# Patient Record
Sex: Female | Born: 1995 | Race: White | Hispanic: No | Marital: Married | Smoking: Never smoker
Health system: Southern US, Community
[De-identification: ages and names within clinical notes are randomized; demographics above are authoritative.]

## PROBLEM LIST (undated history)

## (undated) ENCOUNTER — Inpatient Hospital Stay (HOSPITAL_COMMUNITY): Payer: Self-pay

## (undated) DIAGNOSIS — D126 Benign neoplasm of colon, unspecified: Secondary | ICD-10-CM

## (undated) DIAGNOSIS — G4700 Insomnia, unspecified: Secondary | ICD-10-CM

## (undated) DIAGNOSIS — F419 Anxiety disorder, unspecified: Secondary | ICD-10-CM

## (undated) DIAGNOSIS — K589 Irritable bowel syndrome without diarrhea: Secondary | ICD-10-CM

## (undated) DIAGNOSIS — J309 Allergic rhinitis, unspecified: Secondary | ICD-10-CM

## (undated) DIAGNOSIS — F329 Major depressive disorder, single episode, unspecified: Secondary | ICD-10-CM

## (undated) DIAGNOSIS — R51 Headache: Secondary | ICD-10-CM

## (undated) DIAGNOSIS — R519 Headache, unspecified: Secondary | ICD-10-CM

## (undated) DIAGNOSIS — T7840XA Allergy, unspecified, initial encounter: Secondary | ICD-10-CM

## (undated) HISTORY — DX: Benign neoplasm of colon, unspecified: D12.6

## (undated) HISTORY — DX: Other disorders of bilirubin metabolism: E80.6

## (undated) HISTORY — PX: TONSILLECTOMY: SUR1361

## (undated) HISTORY — PX: MOUTH SURGERY: SHX715

## (undated) HISTORY — DX: Irritable bowel syndrome without diarrhea: K58.9

## (undated) HISTORY — DX: Insomnia, unspecified: G47.00

## (undated) HISTORY — DX: Allergic rhinitis, unspecified: J30.9

## (undated) HISTORY — DX: Major depressive disorder, single episode, unspecified: F32.9

## (undated) HISTORY — DX: Allergy, unspecified, initial encounter: T78.40XA

## (undated) HISTORY — DX: Anxiety disorder, unspecified: F41.9

---
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
## 2013-02-14 ENCOUNTER — Encounter (HOSPITAL_BASED_OUTPATIENT_CLINIC_OR_DEPARTMENT_OTHER): Payer: Self-pay | Admitting: Emergency Medicine

## 2013-02-14 ENCOUNTER — Emergency Department (HOSPITAL_BASED_OUTPATIENT_CLINIC_OR_DEPARTMENT_OTHER)
Admission: EM | Admit: 2013-02-14 | Discharge: 2013-02-14 | Disposition: A | Discharge status: Home / Self Care | Payer: BC Managed Care – PPO | Attending: Emergency Medicine | Admitting: Emergency Medicine

## 2013-02-14 DIAGNOSIS — G4700 Insomnia, unspecified: Secondary | ICD-10-CM | POA: Insufficient documentation

## 2013-02-14 DIAGNOSIS — F3289 Other specified depressive episodes: Secondary | ICD-10-CM | POA: Insufficient documentation

## 2013-02-14 DIAGNOSIS — A088 Other specified intestinal infections: Secondary | ICD-10-CM | POA: Insufficient documentation

## 2013-02-14 DIAGNOSIS — Z862 Personal history of diseases of the blood and blood-forming organs and certain disorders involving the immune mechanism: Secondary | ICD-10-CM | POA: Insufficient documentation

## 2013-02-14 DIAGNOSIS — A084 Viral intestinal infection, unspecified: Secondary | ICD-10-CM

## 2013-02-14 DIAGNOSIS — IMO0002 Reserved for concepts with insufficient information to code with codable children: Secondary | ICD-10-CM | POA: Insufficient documentation

## 2013-02-14 DIAGNOSIS — Z8639 Personal history of other endocrine, nutritional and metabolic disease: Secondary | ICD-10-CM | POA: Insufficient documentation

## 2013-02-14 DIAGNOSIS — Z792 Long term (current) use of antibiotics: Secondary | ICD-10-CM | POA: Insufficient documentation

## 2013-02-14 DIAGNOSIS — F329 Major depressive disorder, single episode, unspecified: Secondary | ICD-10-CM | POA: Insufficient documentation

## 2013-02-14 DIAGNOSIS — Z3202 Encounter for pregnancy test, result negative: Secondary | ICD-10-CM | POA: Insufficient documentation

## 2013-02-14 DIAGNOSIS — Z79899 Other long term (current) drug therapy: Secondary | ICD-10-CM | POA: Insufficient documentation

## 2013-02-14 LAB — URINE MICROSCOPIC-ADD ON

## 2013-02-14 LAB — URINALYSIS, ROUTINE W REFLEX MICROSCOPIC
Bilirubin Urine: NEGATIVE
GLUCOSE, UA: NEGATIVE mg/dL
KETONES UR: 15 mg/dL — AB
Leukocytes, UA: NEGATIVE
Nitrite: NEGATIVE
Protein, ur: NEGATIVE mg/dL
Specific Gravity, Urine: 1.023 (ref 1.005–1.030)
UROBILINOGEN UA: 0.2 mg/dL (ref 0.0–1.0)
pH: 6.5 (ref 5.0–8.0)

## 2013-02-14 LAB — PREGNANCY, URINE: Preg Test, Ur: NEGATIVE

## 2013-02-14 MED ORDER — SODIUM CHLORIDE 0.9 % IV BOLUS (SEPSIS)
1000.0000 mL | Freq: Once | INTRAVENOUS | Status: AC
  Administered 2013-02-14: 1000 mL via INTRAVENOUS

## 2013-02-14 MED ORDER — ONDANSETRON 4 MG PO TBDP
ORAL_TABLET | ORAL | Status: AC
  Filled 2013-02-14: qty 1

## 2013-02-14 MED ORDER — DIPHENOXYLATE-ATROPINE 2.5-0.025 MG PO TABS: 1.0000 | ORAL_TABLET | Freq: Four times a day (QID) | ORAL | Status: DC | PRN

## 2013-02-14 MED ORDER — DIPHENOXYLATE-ATROPINE 2.5-0.025 MG PO TABS
2.0000 | ORAL_TABLET | Freq: Once | ORAL | Status: AC
  Administered 2013-02-14: 2 via ORAL
  Filled 2013-02-14: qty 2

## 2013-02-14 MED ORDER — ONDANSETRON HCL 4 MG/2ML IJ SOLN
4.0000 mg | Freq: Once | INTRAMUSCULAR | Status: AC
  Administered 2013-02-14: 4 mg via INTRAVENOUS
  Filled 2013-02-14: qty 2

## 2013-02-14 MED ORDER — ONDANSETRON 8 MG PO TBDP: 8.0000 mg | ORAL_TABLET | Freq: Three times a day (TID) | ORAL | Status: DC | PRN

## 2013-02-14 NOTE — ED Notes (Signed)
Pt states she awoke around 0200 with persistent vomting, developed chills, leg cramps

## 2013-02-14 NOTE — ED Provider Notes (Signed)
CSN: [id number]     Arrival date & time 02/14/13  4034 History   None    Chief Complaint  Patient presents with  . Vomiting    (Consider location/radiation/quality/duration/timing/severity/associated sxs/prior Treatment) HPI This 18 year old female who went to bed yesterday evening about 11:30 with some stomach discomfort. She awoke about 2:30 this morning with vomiting. She vomited 3 times. She then drank some fluids and went back to bed. She woke about 4:30 with several further episodes of vomiting. The vomiting has been associated with some mild to moderate abdominal cramping, dry mouth, chills and muscle cramps in her legs. She has had no diarrhea. She is not aware of having a fever. She is currently on her menses.  Past Medical History  Diagnosis Date  . ALLERGIC RHINITIS 12/12/2006  . GILBERT'S SYNDROME 03/07/2010  . INSOMNIA-SLEEP DISORDER-UNSPEC 04/18/2007  . Depression 10/03/2011   Past Surgical History  Procedure Laterality Date  . Tonsillectomy     Family History  Problem Relation Age of Onset  . Breast cancer Maternal Grandmother   . Heart disease Maternal Grandmother   . Pancreatic cancer    . Lung cancer Paternal Grandfather   . Kidney cancer Maternal Aunt   . Heart disease Father   . Heart disease Maternal Grandfather    History  Substance Use Topics  . Smoking status: Never Smoker   . Smokeless tobacco: Never Used  . Alcohol Use: No   OB History   Grav Para Term Preterm Abortions TAB SAB Ect Mult Living                 Review of Systems  All other systems reviewed and are negative.    Allergies  Azithromycin; Hydrocodone; and Morphine  Home Medications   Current Outpatient Rx  Name  Route  Sig  Dispense  Refill  . escitalopram (LEXAPRO) 10 MG tablet      TAKE 1 TABLET EVERY DAY   90 tablet   3   . amoxicillin-clavulanate (AUGMENTIN) 875-125 MG per tablet   Oral   Take 1 tablet by mouth 2 (two) times daily.   20 tablet   0   . ondansetron  (ZOFRAN) 4 MG tablet   Oral   Take 1 tablet (4 mg total) by mouth every 8 (eight) hours as needed for nausea.   40 tablet   1   . SUMAtriptan (IMITREX) 100 MG tablet   Oral   Take 1 tablet (100 mg total) by mouth every 2 (two) hours as needed for migraine.   10 tablet   3   . triamcinolone cream (KENALOG) 0.1 %   Topical   Apply topically 2 (two) times daily.   30 g   0    BP 112/65  Pulse 102  Temp(Src) 98.7 F (37.1 C) (Oral)  Resp 18  Ht 5\' 7"  (1.702 m)  Wt 120 lb (54.432 kg)  BMI 18.79 kg/m2  SpO2 100%  LMP 02/08/2013  Physical Exam General: Well-developed, well-nourished female in no acute distress; appearance consistent with age of record HENT: normocephalic; atraumatic Eyes: pupils equal, round and reactive to light; extraocular muscles intact Neck: supple Heart: regular rate and rhythm; tachycardia Lungs: clear to auscultation bilaterally Abdomen: soft; nondistended; mild epigastric and suprapubic tenderness; no masses or hepatosplenomegaly; bowel sounds present Extremities: No deformity; full range of motion; pulses normal; no edema Neurologic: Awake, alert and oriented; motor function intact in all extremities and symmetric; no facial droop Skin: Warm and dry Psychiatric:  Normal mood and affect    ED Course  Procedures (including critical care time)    MDM   Nursing notes and vitals signs, including pulse oximetry, reviewed.  Summary of this visit's results, reviewed by myself:  Labs:  Results for orders placed during the hospital encounter of 02/14/13 (from the past 24 hour(s))  URINALYSIS, ROUTINE W REFLEX MICROSCOPIC     Status: Abnormal   Collection Time    02/14/13  5:33 AM      Result Value Range   Color, Urine YELLOW  YELLOW   APPearance CLEAR  CLEAR   Specific Gravity, Urine 1.023  1.005 - 1.030   pH 6.5  5.0 - 8.0   Glucose, UA NEGATIVE  NEGATIVE mg/dL   Hgb urine dipstick LARGE (*) NEGATIVE   Bilirubin Urine NEGATIVE  NEGATIVE    Ketones, ur 15 (*) NEGATIVE mg/dL   Protein, ur NEGATIVE  NEGATIVE mg/dL   Urobilinogen, UA 0.2  0.0 - 1.0 mg/dL   Nitrite NEGATIVE  NEGATIVE   Leukocytes, UA NEGATIVE  NEGATIVE  PREGNANCY, URINE     Status: None   Collection Time    02/14/13  5:33 AM      Result Value Range   Preg Test, Ur NEGATIVE  NEGATIVE  URINE MICROSCOPIC-ADD ON     Status: Abnormal   Collection Time    02/14/13  5:33 AM      Result Value Range   Squamous Epithelial / LPF FEW (*) RARE   WBC, UA 0-2  <3 WBC/hpf   RBC / HPF 21-50  <3 RBC/hpf   Bacteria, UA FEW (*) RARE   6:48 AM Patient drinking fluids without emesis after IV fluid bolus and Zofran. She is now also having diarrhea.    Wynetta Fines, MD 02/14/13 [phone]

## 2013-06-02 ENCOUNTER — Encounter: Payer: Self-pay | Admitting: Internal Medicine

## 2013-06-02 ENCOUNTER — Ambulatory Visit (INDEPENDENT_AMBULATORY_CARE_PROVIDER_SITE_OTHER): Payer: BC Managed Care – PPO | Admitting: Internal Medicine

## 2013-06-02 VITALS — BP 120/70 | HR 87 | Temp 99.8°F | Resp 13 | Wt 117.4 lb

## 2013-06-02 DIAGNOSIS — J019 Acute sinusitis, unspecified: Secondary | ICD-10-CM

## 2013-06-02 MED ORDER — AMOXICILLIN 500 MG PO CAPS
500.0000 mg | ORAL_CAPSULE | Freq: Three times a day (TID) | ORAL | Status: DC
Start: 2013-06-02 — End: 2013-08-15

## 2013-06-02 NOTE — Patient Instructions (Signed)
Plain Mucinex (NOT D) for thick secretions ;force NON dairy fluids .   Nasal cleansing in the shower as discussed with lather of mild shampoo.After 10 seconds wash off lather while  exhaling through nostrils. Make sure that all residual soap is removed to prevent irritation.  Flonase OR Nasacort AQ 1 spray in each nostril twice a day as needed. Use the "crossover" technique into opposite nostril spraying toward opposite ear @ 45 degree angle, not straight up into nostril.  Use a Neti pot daily only  as needed for significant sinus congestion; going from open side to congested side . Plain Allegra (NOT D )  160 daily , Loratidine 10 mg , OR Zyrtec 10 mg @ bedtime  as needed for itchy eyes & sneezing. Use Eucerin or another moisturizing agent  twice a day  for the drying.

## 2013-06-02 NOTE — Progress Notes (Signed)
   Subjective:    Patient ID: Mary Cochran, female    DOB: [birth year], 18 y.o.   MRN: [id number]  HPI   The symptoms began approximately one week ago as rhinitis, head congestion, itchy/watery eyes and sneezing. She also had cold sweats and wet palms and possible low-grade fever. She felt that the trigger was probably environmental factors rather than sick associates. She is in pre-nursing at a local community [employer]  Zyrtec did help some of the symptoms.  Now she has frontal sinus headache, dark green nasal secretions, left earache, dry cough, dyspnea, fever, chills, sweats, and the extrinsic symptoms noted above. She also has had some epistaxis.    Review of Systems  She denies facial pain, dental pain, sore throat, otic discharge, purulent sputum, wheezing, or significant arthralgias/myalgias.     Objective:   Physical Exam General appearance: thin but in good health ;well nourished; no acute distress or increased work of breathing is present.  No  lymphadenopathy about the head, neck, or axilla noted.   Eyes: No conjunctival inflammation or lid edema is present. Ears:  External ear exam shows no significant lesions or deformities.  Otoscopic examination reveals clear canals, tympanic membranes are intact bilaterally without bulging, retraction, inflammation or discharge.Ear jewelry/posts  Nose:  External nasal examination shows no deformity or inflammation. Post jewelry. Nasal mucosa are erythematous & dry without lesions or exudates. No septal dislocation or deviation.No obstruction to airflow.   Oral exam: Dental hygiene is good; lips and gums are healthy appearing.There is no oropharyngeal erythema or exudate noted.   Neck:  No deformities, masses, or tenderness noted.   Supple with full range of motion without pain.   Heart:  Normal rate and regular rhythm. S1 and S2 normal without gallop, murmur, click, rub or other extra sounds.   Lungs:Chest clear to auscultation; no  wheezes, rhonchi,rales ,or rubs present.No increased work of breathing.    Extremities:  No cyanosis, edema, or clubbing  noted    Skin: Warm & dry          Assessment & Plan:  #1 rhinosinusitis without significant bronchitis  Plan: Nasal hygiene interventions discussed. See prescription medications

## 2013-06-02 NOTE — Progress Notes (Signed)
Pre visit review using our clinic review tool, if applicable. No additional management support is needed unless otherwise documented below in the visit note. 

## 2013-06-03 ENCOUNTER — Ambulatory Visit: Payer: BC Managed Care – PPO | Admitting: Internal Medicine

## 2013-08-14 ENCOUNTER — Encounter: Payer: Self-pay | Admitting: Physician Assistant

## 2013-08-14 ENCOUNTER — Ambulatory Visit (INDEPENDENT_AMBULATORY_CARE_PROVIDER_SITE_OTHER): Payer: BC Managed Care – PPO | Admitting: Physician Assistant

## 2013-08-14 VITALS — BP 100/74 | HR 72 | Temp 98.3°F | Resp 18 | Wt 119.0 lb

## 2013-08-14 DIAGNOSIS — J029 Acute pharyngitis, unspecified: Secondary | ICD-10-CM

## 2013-08-14 LAB — POCT MONO (EPSTEIN BARR VIRUS): Mono, POC: NEGATIVE

## 2013-08-14 NOTE — Progress Notes (Signed)
Pre visit review using our clinic review tool, if applicable. No additional management support is needed unless otherwise documented below in the visit note. 

## 2013-08-14 NOTE — Patient Instructions (Addendum)
We will call you with the results of your throat culture when available.  Throat sprays, throat lozenges, and salt water gargles can help relieve sore throat symptoms.  Tylenol or Motrin alternating for aches and pains.  Plain Over the Counter Mucinex (NOT Mucinex D) for thick secretions  Force NON dairy fluids, drinking plenty of water is best.    Over the Counter Flonase OR Nasacort AQ 1 spray in each nostril twice a day as needed. Use the "crossover" technique into opposite nostril spraying toward opposite ear @ 45 degree angle, not straight up into nostril.   Plain Over the Counter Allegra (NOT D )  160 daily , OR Loratidine 10 mg , OR Zyrtec 10 mg @ bedtime  as needed for itchy eyes & sneezing.  Saline Irrigation and Saline Sprays can also help reduce symptoms.  If emergency symptoms discussed during visit developed, seek medical attention immediately.  Followup as needed, or for worsening or persistent symptoms despite treatment.   Pharyngitis Pharyngitis is a sore throat (pharynx). There is redness, pain, and swelling of your throat. HOME CARE   Drink enough fluids to keep your pee (urine) clear or pale yellow.  Only take medicine as told by your doctor.  You may get sick again if you do not take medicine as told. Finish your medicines, even if you start to feel better.  Do not take aspirin.  Rest.  Rinse your mouth (gargle) with salt water ( tsp of salt per 1 qt of water) every 1-2 hours. This will help the pain.  If you are not at risk for choking, you can suck on hard candy or sore throat lozenges. GET HELP IF:  You have large, tender lumps on your neck.  You have a rash.  You cough up green, yellow-brown, or bloody spit. GET HELP RIGHT AWAY IF:   You have a stiff neck.  You drool or cannot swallow liquids.  You throw up (vomit) or are not able to keep medicine or liquids down.  You have very bad pain that does not go away with medicine.  You have  problems breathing (not from a stuffy nose). MAKE SURE YOU:   Understand these instructions.  Will watch your condition.  Will get help right away if you are not doing well or get worse. Document Released: 07/11/2007 Document Revised: 11/12/2012 Document Reviewed: 09/29/2012 [facility] Patient Information 2015 [place], [place]. This information is not intended to replace advice given to you by your health care provider. Make sure you discuss any questions you have with your health care provider.

## 2013-08-14 NOTE — Progress Notes (Signed)
Subjective:    Patient ID: Mary Cochran, female    DOB: October 02, 1995, 18 y.o.   MRN: [id number]  Sore Throat  This is a new problem. The current episode started in the past 7 days (3 days). The problem has been gradually worsening. The pain is worse on the right side. There has been no fever. The pain is at a severity of 6/10. The pain is moderate. Associated symptoms include congestion, coughing, ear pain, headaches, neck pain, swollen glands and trouble swallowing. Pertinent negatives include no abdominal pain, diarrhea, drooling, ear discharge, hoarse voice, plugged ear sensation, shortness of breath, stridor or vomiting. She has had no exposure to strep or mono. Exposure to: Brother and sister had a cold last weekend.. She has tried NSAIDs for the symptoms. The treatment provided mild (helped the pain) relief.      Review of Systems  Constitutional: Positive for diaphoresis ("clammy"). Negative for fever and chills.  HENT: Positive for congestion, ear pain, postnasal drip, sinus pressure, sore throat and trouble swallowing. Negative for drooling, ear discharge and hoarse voice.   Respiratory: Positive for cough. Negative for shortness of breath and stridor.   Cardiovascular: Negative for chest pain.  Gastrointestinal: Negative for nausea, vomiting, abdominal pain and diarrhea.  Musculoskeletal: Positive for neck pain.  Neurological: Positive for headaches.  All other systems reviewed and are negative.     Past Medical History  Diagnosis Date  . ALLERGIC RHINITIS 12/12/2006  . GILBERT'S SYNDROME 03/07/2010  . INSOMNIA-SLEEP DISORDER-UNSPEC 04/18/2007  . Depression 10/03/2011    History   Social History  . Marital Status: Single    Spouse Name: N/A    Number of Children: N/A  . Years of Education: N/A   Occupational History  . Not on file.   Social History Main Topics  . Smoking status: Never Smoker   . Smokeless tobacco: Never Used  . Alcohol Use: No  . Drug Use: No   . Sexual Activity: Not on file   Other Topics Concern  . Not on file   Social History Narrative  . No narrative on file    Past Surgical History  Procedure Laterality Date  . Tonsillectomy      Family History  Problem Relation Age of Onset  . Breast cancer Maternal Grandmother   . Heart disease Maternal Grandmother   . Pancreatic cancer    . Lung cancer Paternal Grandfather   . Kidney cancer Maternal Aunt   . Heart disease Father   . Heart disease Maternal Grandfather     Allergies  Allergen Reactions  . Azithromycin   . Hydrocodone     REACTION: GI upset  . Morphine     Current Outpatient Prescriptions on File Prior to Visit  Medication Sig Dispense Refill  . diphenoxylate-atropine (LOMOTIL) 2.5-0.025 MG per tablet Take 1-2 tablets by mouth 4 (four) times daily as needed for diarrhea or loose stools.  16 tablet  0  . escitalopram (LEXAPRO) 10 MG tablet TAKE 1 TABLET EVERY DAY  90 tablet  3  . ondansetron (ZOFRAN ODT) 8 MG disintegrating tablet Take 1 tablet (8 mg total) by mouth every 8 (eight) hours as needed.  10 tablet  0  . triamcinolone cream (KENALOG) 0.1 % Apply topically 2 (two) times daily.  30 g  0  . amoxicillin (AMOXIL) 500 MG capsule Take 1 capsule (500 mg total) by mouth 3 (three) times daily.  30 capsule  0   No current facility-administered medications on  file prior to visit.    EXAM: BP 100/74  Pulse 72  Temp(Src) 98.3 F (36.8 C) (Oral)  Resp 18  Wt 119 lb (53.978 kg)      Objective:   Physical Exam  Nursing note and vitals reviewed. Constitutional: She is oriented to person, place, and time. She appears well-developed and well-nourished. No distress.  HENT:  Head: Normocephalic and atraumatic.  Right Ear: External ear normal.  Left Ear: External ear normal.  Nose: Nose normal.  Mouth/Throat: No oropharyngeal exudate.  Oropharynx is slightly erythematous, no exudate. Bilateral TMs normal. Bilateral frontal sinuses non-ttp. Right  maxillary sinuses mildly TTP. Left Max sinus non-ttp.   Eyes: Conjunctivae and EOM are normal. Pupils are equal, round, and reactive to light.  Neck: Normal range of motion. Neck supple.  Cardiovascular: Normal rate, regular rhythm and intact distal pulses.   Pulmonary/Chest: Effort normal and breath sounds normal. No stridor. No respiratory distress. She exhibits no tenderness.  Abdominal: Soft. Bowel sounds are normal. She exhibits no distension and no mass. There is no tenderness. There is no rebound and no guarding.  Musculoskeletal: Normal range of motion.  Lymphadenopathy:    She has no cervical adenopathy.  Neurological: She is alert and oriented to person, place, and time.  Skin: Skin is warm and dry. No rash noted. She is not diaphoretic. No erythema. No pallor.  Psychiatric: She has a normal mood and affect. Her behavior is normal. Judgment and thought content normal.     Lab Results  Component Value Date   WBC 8.5 10/03/2011   HGB 13.6 10/03/2011   HCT 41.4 10/03/2011   PLT 287.0 10/03/2011   GLUCOSE 82 10/03/2011   ALT 16 10/03/2011   AST 23 10/03/2011   NA 138 10/03/2011   K 3.8 10/03/2011   CL 105 10/03/2011   CREATININE 0.8 10/03/2011   BUN 9 10/03/2011   CO2 27 10/03/2011   TSH 0.98 10/03/2011        Assessment & Plan:  Joellyn was seen today for sore throat.  Diagnoses and associated orders for this visit:  Sorethroat - POC Mono (Epstein Barr Virus) - POC Rapid Strep A - Throat culture ([facility]) - Epstein-Barr virus VCA antibody panel - Epstein-Barr virus VCA, IgM  Acute pharyngitis, unspecified pharyngitis type Comments: Strep neg. Monospot pos? Will culture. Probably viral. Will tx with OTC throat lozenges, sprays, salt water gargles, flonase, zyrtec, mucinex, push fluids. - Epstein-Barr virus VCA antibody panel - Epstein-Barr virus VCA, IgM    Pts mono spot was faintly positive. Will obtain ebstein-barr titer. Advised pt on avoiding physical activity due  to risk of splenic rupture with mono, though the patient exhibits no hepatosplenomegaly on exam.  Return precautions provided, and patient handout on pharyngitis.  Plan to follow up as needed, or for worsening or persistent symptoms despite treatment.  Patient Instructions  We will call you with the results of your throat culture when available.  Throat sprays, throat lozenges, and salt water gargles can help relieve sore throat symptoms.  Tylenol or Motrin alternating for aches and pains.  Plain Over the Counter Mucinex (NOT Mucinex D) for thick secretions  Force NON dairy fluids, drinking plenty of water is best.    Over the Counter Flonase OR Nasacort AQ 1 spray in each nostril twice a day as needed. Use the "crossover" technique into opposite nostril spraying toward opposite ear @ 45 degree angle, not straight up into nostril.   Plain Over the [employer] (  NOT D )  160 daily , OR Loratidine 10 mg , OR Zyrtec 10 mg @ bedtime  as needed for itchy eyes & sneezing.  Saline Irrigation and Saline Sprays can also help reduce symptoms.  If emergency symptoms discussed during visit developed, seek medical attention immediately.  Followup as needed, or for worsening or persistent symptoms despite treatment.

## 2013-08-15 ENCOUNTER — Encounter (HOSPITAL_BASED_OUTPATIENT_CLINIC_OR_DEPARTMENT_OTHER): Payer: Self-pay | Admitting: Emergency Medicine

## 2013-08-15 ENCOUNTER — Emergency Department (HOSPITAL_BASED_OUTPATIENT_CLINIC_OR_DEPARTMENT_OTHER)
Admission: EM | Admit: 2013-08-15 | Discharge: 2013-08-15 | Disposition: A | Discharge status: Home / Self Care | Payer: BC Managed Care – PPO | Attending: Emergency Medicine | Admitting: Emergency Medicine

## 2013-08-15 DIAGNOSIS — Z8639 Personal history of other endocrine, nutritional and metabolic disease: Secondary | ICD-10-CM | POA: Insufficient documentation

## 2013-08-15 DIAGNOSIS — Z79899 Other long term (current) drug therapy: Secondary | ICD-10-CM | POA: Insufficient documentation

## 2013-08-15 DIAGNOSIS — Z862 Personal history of diseases of the blood and blood-forming organs and certain disorders involving the immune mechanism: Secondary | ICD-10-CM | POA: Insufficient documentation

## 2013-08-15 DIAGNOSIS — B349 Viral infection, unspecified: Secondary | ICD-10-CM

## 2013-08-15 DIAGNOSIS — Z8709 Personal history of other diseases of the respiratory system: Secondary | ICD-10-CM | POA: Insufficient documentation

## 2013-08-15 DIAGNOSIS — Z3202 Encounter for pregnancy test, result negative: Secondary | ICD-10-CM | POA: Insufficient documentation

## 2013-08-15 DIAGNOSIS — B9789 Other viral agents as the cause of diseases classified elsewhere: Secondary | ICD-10-CM | POA: Insufficient documentation

## 2013-08-15 DIAGNOSIS — F3289 Other specified depressive episodes: Secondary | ICD-10-CM | POA: Insufficient documentation

## 2013-08-15 DIAGNOSIS — R599 Enlarged lymph nodes, unspecified: Secondary | ICD-10-CM | POA: Insufficient documentation

## 2013-08-15 DIAGNOSIS — F329 Major depressive disorder, single episode, unspecified: Secondary | ICD-10-CM | POA: Insufficient documentation

## 2013-08-15 LAB — EPSTEIN-BARR VIRUS VCA, IGM: EBV VCA IgM: <10 U/mL (ref <36.0)

## 2013-08-15 LAB — URINALYSIS, ROUTINE W REFLEX MICROSCOPIC
Bilirubin Urine: NEGATIVE
Glucose, UA: NEGATIVE mg/dL
Ketones, ur: NEGATIVE mg/dL
Leukocytes, UA: NEGATIVE
Nitrite: NEGATIVE
Protein, ur: NEGATIVE mg/dL
SPECIFIC GRAVITY, URINE: 1.002 — AB (ref 1.005–1.030)
Urobilinogen, UA: 0.2 mg/dL (ref 0.0–1.0)
pH: 6.5 (ref 5.0–8.0)

## 2013-08-15 LAB — URINE MICROSCOPIC-ADD ON

## 2013-08-15 LAB — EPSTEIN-BARR VIRUS VCA ANTIBODY PANEL
EBV EA IgG: <5 U/mL (ref <9.0)
EBV NA IgG: 542 U/mL — ABNORMAL HIGH (ref <18.0)
EBV VCA IgM: <10 U/mL (ref <36.0)

## 2013-08-15 LAB — PREGNANCY, URINE: PREG TEST UR: NEGATIVE

## 2013-08-15 NOTE — ED Provider Notes (Signed)
CSN: [id number]     Arrival date & time 08/15/13  9485 History   First MD Initiated Contact with Patient 08/15/13 1000     Chief Complaint  Patient presents with  . Generalized Body Aches     (Consider location/radiation/quality/duration/timing/severity/associated sxs/prior Treatment) HPI Comments: Patient presents with fever and body aches. She also has a sore throat. She said the sore throat started 4 days ago. She's been running intermittent fevers but hasn't checked her temperature. She's had some chills with her fever goes up. She's had no nausea or vomiting. Yesterday she started having some increasing achiness in her arms and her legs. She also is sore across her neck and back and has a mild headache. She denies any rashes. She denies abdominal pain. She was seen by her primary care physician yesterday who did a rapid strep that was negative. Monospot was negative. Mono antibodies were sent.   Past Medical History  Diagnosis Date  . ALLERGIC RHINITIS 12/12/2006  . GILBERT'S SYNDROME 03/07/2010  . INSOMNIA-SLEEP DISORDER-UNSPEC 04/18/2007  . Depression 10/03/2011   Past Surgical History  Procedure Laterality Date  . Tonsillectomy     Family History  Problem Relation Age of Onset  . Breast cancer Maternal Grandmother   . Heart disease Maternal Grandmother   . Pancreatic cancer    . Lung cancer Paternal Grandfather   . Kidney cancer Maternal Aunt   . Heart disease Father   . Heart disease Maternal Grandfather    History  Substance Use Topics  . Smoking status: Never Smoker   . Smokeless tobacco: Never Used  . Alcohol Use: No   OB History   Grav Para Term Preterm Abortions TAB SAB Ect Mult Living                 Review of Systems  Constitutional: Positive for fever, chills, activity change, appetite change and fatigue. Negative for diaphoresis.  HENT: Positive for rhinorrhea and sore throat. Negative for congestion, sneezing, trouble swallowing and voice change.   Eyes:  Negative.   Respiratory: Negative for cough, chest tightness and shortness of breath.   Cardiovascular: Negative for chest pain and leg swelling.  Gastrointestinal: Negative for nausea, vomiting, abdominal pain, diarrhea and blood in stool.  Genitourinary: Negative for frequency, hematuria, flank pain and difficulty urinating.  Musculoskeletal: Positive for myalgias and neck pain. Negative for arthralgias and back pain.  Skin: Negative for rash.  Neurological: Positive for headaches. Negative for dizziness, speech difficulty, weakness and numbness.      Allergies  Azithromycin; Hydrocodone; and Morphine  Home Medications   Prior to Admission medications   Medication Sig Start Date End Date Taking? Authorizing Provider  norethindrone-ethinyl estradiol (JUNEL FE,GILDESS FE,LOESTRIN FE) 1-20 MG-MCG tablet Take 1 tablet by mouth daily.   Yes Historical Provider, MD  escitalopram (LEXAPRO) 10 MG tablet TAKE 1 TABLET EVERY DAY 09/21/12   Biagio Borg, MD  triamcinolone cream (KENALOG) 0.1 % Apply topically 2 (two) times daily. 10/16/12   Biagio Borg, MD   BP 97/69  Pulse 106  Temp(Src) 98.7 F (37.1 C) (Oral)  Resp 18  Ht 5\' 7"  (1.702 m)  Wt 118 lb (53.524 kg)  BMI 18.48 kg/m2  SpO2 98% Physical Exam  Constitutional: She is oriented to person, place, and time. She appears well-developed and well-nourished.  HENT:  Head: Normocephalic and atraumatic.  Right Ear: External ear normal.  Left Ear: External ear normal.  Mild erythema to the posterior pharynx  Eyes: Pupils  are equal, round, and reactive to light.  Neck: Normal range of motion. Neck supple.  There is no neck stiffness. Patient sitting up in bed moving her head all around. She is able to touch her chin to her chest. She's able to bring her knees up to her chest and roll up in a fetal position without problem.  Cardiovascular: Normal rate, regular rhythm and normal heart sounds.   Pulmonary/Chest: Effort normal and breath  sounds normal. No respiratory distress. She has no wheezes. She has no rales. She exhibits no tenderness.  Abdominal: Soft. Bowel sounds are normal. There is no tenderness. There is no rebound and no guarding.  Musculoskeletal: Normal range of motion. She exhibits no edema.  Lymphadenopathy:    She has cervical adenopathy (Mild).  Neurological: She is alert and oriented to person, place, and time.  Skin: Skin is warm and dry. No rash noted.  No rash  Psychiatric: She has a normal mood and affect.    ED Course  Procedures (including critical care time) Labs Review Results for orders placed during the hospital encounter of 08/15/13  URINALYSIS, ROUTINE W REFLEX MICROSCOPIC      Result Value Ref Range   Color, Urine YELLOW  YELLOW   APPearance CLEAR  CLEAR   Specific Gravity, Urine 1.002 (*) 1.005 - 1.030   pH 6.5  5.0 - 8.0   Glucose, UA NEGATIVE  NEGATIVE mg/dL   Hgb urine dipstick SMALL (*) NEGATIVE   Bilirubin Urine NEGATIVE  NEGATIVE   Ketones, ur NEGATIVE  NEGATIVE mg/dL   Protein, ur NEGATIVE  NEGATIVE mg/dL   Urobilinogen, UA 0.2  0.0 - 1.0 mg/dL   Nitrite NEGATIVE  NEGATIVE   Leukocytes, UA NEGATIVE  NEGATIVE  PREGNANCY, URINE      Result Value Ref Range   Preg Test, Ur NEGATIVE  NEGATIVE  URINE MICROSCOPIC-ADD ON      Result Value Ref Range   Squamous Epithelial / LPF FEW (*) RARE   RBC / HPF 0-2  <3 RBC/hpf   Bacteria, UA FEW (*) RARE   No results found.    EKG Interpretation None        Imaging Review No results found.   EKG Interpretation None      MDM   Final diagnoses:  Viral syndrome    Patient is well-appearing. She is sitting up in bed smiling playing in her room. She clinically did not appear to have meningitis. I reviewed her labs and her IgG titers were positive for Epstein-Barr however her IgM titers were negative which could indicate either a very recent infection or more likely a past infection.  Either way I feel the patient likely  has a viral syndrome. Her throat culture is still pending it was done by her primary care physician. I advised in symptomatic care and to drink lots of fluids. Advised her followup with her primary care physician if she's not feeling better on Monday. Advised her she has worsening headache neck pain or other worsening symptoms to return here to the ED for reevaluation.    Malvin Johns, MD 08/15/13 1113

## 2013-08-15 NOTE — ED Notes (Addendum)
Patient here with sore throat that started on Wednesday with gland swelling, has progressed to neck pain and now total body pain. Reports that she saw her MD yesterday and had mono test and waiting for that final result. Chills with same. Also complains of occipital headache and neck and spinal pain. Took 2 ibuprofen 1 hour pta. Seen at [facility] primary

## 2013-08-15 NOTE — Discharge Instructions (Signed)

## 2013-08-16 LAB — CULTURE, GROUP A STREP: Organism ID, Bacteria: NORMAL

## 2013-08-17 ENCOUNTER — Telehealth: Payer: Self-pay

## 2013-08-17 ENCOUNTER — Telehealth: Payer: Self-pay | Admitting: *Deleted

## 2013-08-17 NOTE — Telephone Encounter (Signed)
Called and spoke with pt and pt states that she had body aches on Saturday and her throat hurt off and on.  Now pt states she feels like she has a knot on the left side of her throat.  Pt has never seen an ENT specialist.

## 2013-08-17 NOTE — Telephone Encounter (Signed)
[facility] Triage Call Report Triage Record Num: [id number] Operator: Pat Kocher Patient Name: Mary Cochran Call Date & Time: 08/15/2013 9:05:40AM Patient Phone: [phone] PCP: Cathlean Cower Patient Gender: Female PCP Fax : [phone] Patient DOB: [birth year] Practice Name: [facility] Reason for Call: Caller: Nancy/Grandparent; PCP: Cathlean Cower (Adults only); CB#: [phone]; Call regarding Pain all over ; Onset 08/10/13 sore throat. 08/14/13 right gland in neck very swollen with sore throat, shoulders were also hurting. Labs done in office for possible mono, no results. 08/15/13 pain all over, especially in neck. Rates pain 8/10. Hurts to bend neck forward/chin to chest. Unable to sleep. Much worse that previously. Tactile fever (no thermometer available). 0845 Took Ibuprofen 400mg . Emergent sxs of 'New onset of neck pain with forward head movement and severe generalized headache, fever, or altered mental status' positive as per Neck Pain protocol. Care advice given per protocol and advised to go to ER now, grandmother states she will take pt to [place] now. Protocol(s) Used: Neck Pain Recommended Outcome per Protocol: See ED Immediately Reason for Outcome: New onset of neck pain with forward head movement (no injury) AND severe generalized headache, fever, or altered mental status Care Advice: ~ Another adult should drive. ~ IMMEDIATE ACTION Write down provider's name. List or place the following in a bag for transport with the patient: current prescription and/or nonprescription medications; alternative treatments, therapies and medications; and street drugs. ~ ~ Call EMS 911 if new or worsening drowsiness/difficulty awakening, confusion, or seizure. Take sips of clear liquids (such as water, clear fruit juices without pulp, soda, tea or coffee without dairy or non-dairy creamer, clear broth or bouillon, oral hydration solution, or plain gelatin,  fruit ices/popsicles, hard candy) as tolerated. Sucking on ice chips is another option. ~ 08/15/2013 9:21:51AM Page 1 of 1 [id number]

## 2013-08-18 NOTE — Telephone Encounter (Signed)
Ok per Rodman Key to give pt information to ENT.  Left a message for pt to return call.

## 2013-08-20 ENCOUNTER — Telehealth: Payer: Self-pay | Admitting: *Deleted

## 2013-08-20 NOTE — Telephone Encounter (Signed)
Left msg on triage stating md rx lexapro the side effects from that med is making her very aggressive, confrontational, very resistant she is not her self. Requesting md to change to some thing else. Called [name] back inform her pt will need to make appt before med can be change. She ask if we can call pt [phone] to make appt because she doesn't listen to them. Called pt no answer LMOM need to make F/u appt with [name]...Mary Cochran

## 2013-08-20 NOTE — Telephone Encounter (Signed)
Spoke with pt and pt was given information for ENT specialist.

## 2013-08-20 NOTE — Telephone Encounter (Signed)
Left a message for pt to return call 

## 2013-09-27 ENCOUNTER — Other Ambulatory Visit: Payer: Self-pay | Admitting: Internal Medicine

## 2013-09-30 ENCOUNTER — Encounter: Payer: Self-pay | Admitting: Internal Medicine

## 2013-09-30 ENCOUNTER — Ambulatory Visit (INDEPENDENT_AMBULATORY_CARE_PROVIDER_SITE_OTHER): Payer: BC Managed Care – PPO | Admitting: Internal Medicine

## 2013-09-30 VITALS — BP 100/70 | HR 67 | Temp 98.7°F | Ht 67.0 in | Wt 123.1 lb

## 2013-09-30 DIAGNOSIS — Z0000 Encounter for general adult medical examination without abnormal findings: Secondary | ICD-10-CM

## 2013-09-30 DIAGNOSIS — L259 Unspecified contact dermatitis, unspecified cause: Secondary | ICD-10-CM

## 2013-09-30 DIAGNOSIS — F411 Generalized anxiety disorder: Secondary | ICD-10-CM

## 2013-09-30 DIAGNOSIS — F419 Anxiety disorder, unspecified: Secondary | ICD-10-CM

## 2013-09-30 DIAGNOSIS — L309 Dermatitis, unspecified: Secondary | ICD-10-CM | POA: Insufficient documentation

## 2013-09-30 MED ORDER — ESCITALOPRAM OXALATE 10 MG PO TABS: ORAL_TABLET | ORAL | Status: DC

## 2013-09-30 MED ORDER — TRIAMCINOLONE ACETONIDE 0.1 % EX CREA: TOPICAL_CREAM | Freq: Two times a day (BID) | CUTANEOUS | Status: DC

## 2013-09-30 NOTE — Assessment & Plan Note (Signed)

## 2013-09-30 NOTE — Assessment & Plan Note (Signed)
stable overall by history and exam, , and pt to continue medical treatment as before,  to f/u any worsening symptoms or concerns  

## 2013-09-30 NOTE — Patient Instructions (Signed)
Please take all new medication as prescribed  Please continue all other medications as before, and refills have been done if requested.  Please have the pharmacy call with any other refills you may need.  Please continue your efforts at being more active, low cholesterol diet, and weight control.  You are otherwise up to date with prevention measures today.  Please keep your appointments with your specialists as you may have planned  Please return in 1 year for your yearly visit, or sooner if needed

## 2013-09-30 NOTE — Progress Notes (Signed)
Subjective:    Patient ID: Mary Cochran, female    DOB: 11-Dec-1995, 18 y.o.   MRN: [id number]  HPI  Here for wellness and f/u;  Overall doing ok;  Pt denies CP, worsening SOB, DOE, wheezing, orthopnea, PND, worsening LE edema, palpitations, dizziness or syncope.  Pt denies neurological change such as new headache, facial or extremity weakness.  Pt denies polydipsia, polyuria, or low sugar symptoms. Pt states overall good compliance with treatment and medications, good tolerability, and has been trying to follow lower cholesterol diet.  Pt denies worsening depressive symptoms, suicidal ideation or panic. No fever, night sweats, wt loss, loss of appetite, or other constitutional symptoms.  Pt states good ability with ADL's, has low fall risk, home safety reviewed and adequate, no other significant changes in hearing or vision, and only occasionally active with exercise. Does have some eczematous changes of tips of fingers with itch and pain and cracking skin Past Medical History  Diagnosis Date  . ALLERGIC RHINITIS 12/12/2006  . GILBERT'S SYNDROME 03/07/2010  . INSOMNIA-SLEEP DISORDER-UNSPEC 04/18/2007  . Depression 10/03/2011   Past Surgical History  Procedure Laterality Date  . Tonsillectomy      reports that she has never smoked. She has never used smokeless tobacco. She reports that she does not drink alcohol or use illicit drugs. family history includes Breast cancer in her maternal grandmother; Heart disease in her father, maternal grandfather, and maternal grandmother; Kidney cancer in her maternal aunt; Lung cancer in her paternal grandfather; Pancreatic cancer in an other family member. Allergies  Allergen Reactions  . Azithromycin   . Hydrocodone     REACTION: GI upset  . Morphine    Current Outpatient Prescriptions on File Prior to Visit  Medication Sig Dispense Refill  . norethindrone-ethinyl estradiol (JUNEL FE,GILDESS FE,LOESTRIN FE) 1-20 MG-MCG tablet Take 1 tablet by  mouth daily.       No current facility-administered medications on file prior to visit.   Review of Systems Constitutional: Negative for increased diaphoresis, other activity, appetite or other siginficant weight change  HENT: Negative for worsening hearing loss, ear pain, facial swelling, mouth sores and neck stiffness.   Eyes: Negative for other worsening pain, redness or visual disturbance.  Respiratory: Negative for shortness of breath and wheezing.   Cardiovascular: Negative for chest pain and palpitations.  Gastrointestinal: Negative for diarrhea, blood in stool, abdominal distention or other pain Genitourinary: Negative for hematuria, flank pain or change in urine volume.  Musculoskeletal: Negative for myalgias or other joint complaints.  Skin: Negative for color change and wound.  Neurological: Negative for syncope and numbness. other than noted Hematological: Negative for adenopathy. or other swelling Psychiatric/Behavioral: Negative for hallucinations, self-injury, decreased concentration or other worsening agitation.      Objective:   Physical Exam BP 100/70  Pulse 67  Temp(Src) 98.7 F (37.1 C) (Oral)  Ht 5\' 7"  (1.702 m)  Wt 123 lb 2 oz (55.849 kg)  BMI 19.28 kg/m2  SpO2 97% VS noted,  Constitutional: Pt is oriented to person, place, and time. Appears well-developed and well-nourished.  Head: Normocephalic and atraumatic.  Right Ear: External ear normal.  Left Ear: External ear normal.  Nose: Nose normal.  Mouth/Throat: Oropharynx is clear and moist.  Eyes: Conjunctivae and EOM are normal. Pupils are equal, round, and reactive to light.  Neck: Normal range of motion. Neck supple. No JVD present. No tracheal deviation present.  Cardiovascular: Normal rate, regular rhythm, normal heart sounds and intact distal pulses.  Pulmonary/Chest: Effort normal and breath sounds without rales or wheezing  Abdominal: Soft. Bowel sounds are normal. NT. No HSM  Musculoskeletal:  Normal range of motion. Exhibits no edema.  Lymphadenopathy:  Has no cervical adenopathy.  Neurological: Pt is alert and oriented to person, place, and time. Pt has normal reflexes. No cranial nerve deficit. Motor grossly intact Skin: Skin is warm and dry. No rash noted. except for dyshidrotic eczema changes to all fingertips Psychiatric:  Has normal mood and affect. Behavior is normal.     Assessment & Plan:

## 2013-09-30 NOTE — Progress Notes (Signed)
Pre visit review using our clinic review tool, if applicable. No additional management support is needed unless otherwise documented below in the visit note. 

## 2013-09-30 NOTE — Assessment & Plan Note (Signed)
For triam cr prn 

## 2013-10-29 ENCOUNTER — Encounter: Payer: Self-pay | Admitting: Internal Medicine

## 2013-10-29 ENCOUNTER — Ambulatory Visit (INDEPENDENT_AMBULATORY_CARE_PROVIDER_SITE_OTHER): Payer: BC Managed Care – PPO | Admitting: Internal Medicine

## 2013-10-29 VITALS — BP 94/64 | HR 64 | Temp 98.1°F | Wt 124.0 lb

## 2013-10-29 DIAGNOSIS — J069 Acute upper respiratory infection, unspecified: Secondary | ICD-10-CM

## 2013-10-29 DIAGNOSIS — K121 Other forms of stomatitis: Secondary | ICD-10-CM

## 2013-10-29 DIAGNOSIS — K1370 Unspecified lesions of oral mucosa: Secondary | ICD-10-CM

## 2013-10-29 DIAGNOSIS — J309 Allergic rhinitis, unspecified: Secondary | ICD-10-CM

## 2013-10-29 DIAGNOSIS — L309 Dermatitis, unspecified: Secondary | ICD-10-CM

## 2013-10-29 DIAGNOSIS — L259 Unspecified contact dermatitis, unspecified cause: Secondary | ICD-10-CM

## 2013-10-29 MED ORDER — FEXOFENADINE HCL 180 MG PO TABS: 180.0000 mg | ORAL_TABLET | Freq: Every day | ORAL | Status: DC

## 2013-10-29 MED ORDER — FLUTICASONE PROPIONATE 50 MCG/ACT NA SUSP: 2.0000 | Freq: Every day | NASAL | Status: DC

## 2013-10-29 MED ORDER — AZITHROMYCIN 250 MG PO TABS: ORAL_TABLET | ORAL | Status: DC

## 2013-10-29 MED ORDER — TRIAMCINOLONE ACETONIDE 0.1 % MT PSTE: 1.0000 "application " | PASTE | Freq: Two times a day (BID) | OROMUCOSAL | Status: DC

## 2013-10-29 NOTE — Patient Instructions (Signed)
Please take all new medication as prescribed - the antibiotic, allegra, flonase and the paste for the sore  Please continue all other medications as before, and refills have been done if requested.  Please have the pharmacy call with any other refills you may need.  Please keep your appointments with your specialists as you may have planned  Your right ear was irrigated of wax today  You will be contacted regarding the referral for: Dermatology  You can also take Delsym OTC for cough, and/or Mucinex (or it's generic off brand) for congestion, and tylenol as needed for pain.

## 2013-10-29 NOTE — Progress Notes (Signed)
Subjective:    Patient ID: Mary Cochran, female    DOB: 06-10-1995, 18 y.o.   MRN: [id number]  HPI  Here with 2-3 days onset mild to mod right ear pain, decreased hearing with popping/crackling, and some neck discomfort below the ear unusual for her; Does have several wks ongoing nasal allergy symptoms with clearish congestion, itch and sneezing, without pain, ST, cough, swelling or wheezing. Not sure about fever but has also felt warm in last few days. Also with a quite severe pain/tender ulceration to right upper gum line above the eyetooth for this time as well.  Also with worsening dry skin and cracking to all fingertips with some pain and bleeding despite steroid cr and moisturizing lotions.  Washes her hands freq every day Past Medical History  Diagnosis Date  . ALLERGIC RHINITIS 12/12/2006  . GILBERT'S SYNDROME 03/07/2010  . INSOMNIA-SLEEP DISORDER-UNSPEC 04/18/2007  . Depression 10/03/2011   Past Surgical History  Procedure Laterality Date  . Tonsillectomy      reports that she has never smoked. She has never used smokeless tobacco. She reports that she does not drink alcohol or use illicit drugs. family history includes Breast cancer in her maternal grandmother; Heart disease in her father, maternal grandfather, and maternal grandmother; Kidney cancer in her maternal aunt; Lung cancer in her paternal grandfather; Pancreatic cancer in an other family member. Allergies  Allergen Reactions  . Azithromycin   . Hydrocodone     REACTION: GI upset  . Morphine    Current Outpatient Prescriptions on File Prior to Visit  Medication Sig Dispense Refill  . escitalopram (LEXAPRO) 10 MG tablet TAKE 1 TABLET BY MOUTH EVERY DAY  90 tablet  3  . norethindrone-ethinyl estradiol (JUNEL FE,GILDESS FE,LOESTRIN FE) 1-20 MG-MCG tablet Take 1 tablet by mouth daily.      Marland Kitchen triamcinolone cream (KENALOG) 0.1 % Apply topically 2 (two) times daily.  30 g  1   No current facility-administered  medications on file prior to visit.   Review of Systems  Constitutional: Negative for unusual diaphoresis or other sweats  HENT: Negative for ringing in ear Eyes: Negative for double vision or worsening visual disturbance.  Respiratory: Negative for choking and stridor.   Gastrointestinal: Negative for vomiting or other signifcant bowel change Genitourinary: Negative for hematuria or decreased urine volume.  Musculoskeletal: Negative for other MSK pain or swelling Skin: Negative for color change and worsening wound.  Neurological: Negative for tremors and numbness other than noted  Psychiatric/Behavioral: Negative for decreased concentration or agitation other than above       Objective:   Physical Exam BP 94/64  Pulse 64  Temp(Src) 98.1 F (36.7 C) (Oral)  Wt 124 lb (56.246 kg)  SpO2 97% VS noted, mild ill Constitutional: Pt appears well-developed, well-nourished.  HENT: Head: NCAT.  Right Ear: External ear normal.  Left Ear: External ear normal.  Bilat tm's with mild erythema.  Max sinus areas non tender.  Pharynx with mild erythema, no exudate ; right upper gum with approx 5 mm shallow ulceration, tender with several mm surrounding erythema/swelling Eyes: . Pupils are equal, round, and reactive to light. Conjunctivae and EOM are normal Neck: Normal range of motion. Neck supple.  Cardiovascular: Normal rate and regular rhythm.   Pulmonary/Chest: Effort normal and breath sounds normal.  Neurological: Pt is alert. Not confused , motor grossly intact Skin: Skin is warm. No rash except severe eczematous changes to all fingertips with cracking Psychiatric: Pt behavior is normal. No  agitation.     Assessment & Plan:

## 2013-10-29 NOTE — Progress Notes (Signed)
Pre visit review using our clinic review tool, if applicable. No additional management support is needed unless otherwise documented below in the visit note. 

## 2013-10-30 ENCOUNTER — Telehealth: Payer: Self-pay | Admitting: Internal Medicine

## 2013-10-30 MED ORDER — LEVOFLOXACIN 500 MG PO TABS: 500.0000 mg | ORAL_TABLET | Freq: Every day | ORAL | Status: DC

## 2013-10-30 NOTE — Telephone Encounter (Signed)
Pt called in and said that the azithromycin (ZITHROMAX Z-PAK) 250 MG tablet [[id number]]  that she got yesterday , she is allergic to and can not take.  She will need something else.    Thank you!!

## 2013-10-30 NOTE — Telephone Encounter (Signed)
Done erx 

## 2013-10-30 NOTE — Assessment & Plan Note (Signed)
Ok for kenalog in orabase asd

## 2013-10-30 NOTE — Assessment & Plan Note (Signed)
Mild to mod, for antibx course,  to f/u any worsening symptoms or concerns 

## 2013-10-30 NOTE — Assessment & Plan Note (Signed)
Mild to mod, for allegra/flonase asd,  to f/u any worsening symptoms or concerns  

## 2013-10-30 NOTE — Assessment & Plan Note (Signed)
Dyshidrotic type most likely - for cont steroid cr, but also refer to dermatology

## 2013-11-02 NOTE — Telephone Encounter (Signed)
Called pt no answer LMOM md sent new [facility] to [facility].../lmb

## 2014-01-07 ENCOUNTER — Telehealth: Payer: Self-pay | Admitting: Internal Medicine

## 2014-01-07 NOTE — Telephone Encounter (Signed)
Received records from [facility], sent to [name]. 01/07/14/ss

## 2014-02-05 HISTORY — PX: COLONOSCOPY: SHX174

## 2014-05-18 ENCOUNTER — Encounter: Payer: Self-pay | Admitting: Internal Medicine

## 2014-05-18 ENCOUNTER — Ambulatory Visit: Payer: Self-pay | Admitting: Internal Medicine

## 2014-06-17 ENCOUNTER — Ambulatory Visit (INDEPENDENT_AMBULATORY_CARE_PROVIDER_SITE_OTHER): Payer: BLUE CROSS/BLUE SHIELD | Admitting: Internal Medicine

## 2014-06-17 ENCOUNTER — Encounter: Payer: Self-pay | Admitting: Internal Medicine

## 2014-06-17 VITALS — BP 102/60 | HR 76 | Temp 98.5°F | Wt 120.1 lb

## 2014-06-17 DIAGNOSIS — F419 Anxiety disorder, unspecified: Secondary | ICD-10-CM | POA: Diagnosis not present

## 2014-06-17 DIAGNOSIS — R109 Unspecified abdominal pain: Secondary | ICD-10-CM

## 2014-06-17 DIAGNOSIS — R197 Diarrhea, unspecified: Secondary | ICD-10-CM

## 2014-06-17 DIAGNOSIS — G43809 Other migraine, not intractable, without status migrainosus: Secondary | ICD-10-CM | POA: Diagnosis not present

## 2014-06-17 MED ORDER — ESCITALOPRAM OXALATE 10 MG PO TABS: ORAL_TABLET | ORAL | Status: DC

## 2014-06-17 MED ORDER — SUMATRIPTAN SUCCINATE 100 MG PO TABS: 100.0000 mg | ORAL_TABLET | ORAL | Status: DC | PRN

## 2014-06-17 NOTE — Patient Instructions (Signed)
Please take all new medication as prescribed - the imitrex for migraine  OK to increase the lexapro to 1 and 1/2 tabs per day  Please continue all other medications as before, and refills have been done if requested.  Please have the pharmacy call with any other refills you may need.  Please keep your appointments with your specialists as you may have planned  You will be contacted regarding the referral for: Gastroenterology  Please go to the LAB in the Basement (turn left off the elevator) for the tests to be done at your convenience  You will be contacted by phone if any changes need to be made immediately.  Otherwise, you will receive a letter about your results with an explanation, but please check with MyChart first.  Please remember to sign up for MyChart if you have not done so, as this will be important to you in the future with finding out test results, communicating by private email, and scheduling acute appointments online when needed.

## 2014-06-17 NOTE — Progress Notes (Signed)
Pre visit review using our clinic review tool, if applicable. No additional management support is needed unless otherwise documented below in the visit note. 

## 2014-06-17 NOTE — Assessment & Plan Note (Signed)
Also for labs as documented, refer GI,

## 2014-06-17 NOTE — Assessment & Plan Note (Signed)
[place] for tx with imitrex prn,  to f/u any worsening symptoms or concerns

## 2014-06-17 NOTE — Assessment & Plan Note (Signed)
?   IBS vs IBD - for GI referral

## 2014-06-17 NOTE — Progress Notes (Signed)
Subjective:    Patient ID: Mary Cochran, female    DOB: [birth year], 19 y.o.   MRN: [id number]  HPI  Here to f/u-   C/o incr freq typical migraines once per month (has been less than that)  IBS seems worse recenlty, any food seems to make it worse now, mostly nauesea , pain and diarrhea - no fever, vomiting, no blood, but often times seems like yellow mucous,and pain just not going away. Last colonoscopy aprpox 2 yrs. No recent wt loss. Started new job in Therapist, art x 3 mo  Denies worsening depressive symptoms, suicidal ideation, or panic; has ongoing anxiety, lexapro helps but would like better if possible  Right handed, with recurring twitch of right thumb  Past Medical History  Diagnosis Date  . ALLERGIC RHINITIS 12/12/2006  . GILBERT'S SYNDROME 03/07/2010  . INSOMNIA-SLEEP DISORDER-UNSPEC 04/18/2007  . Depression 10/03/2011   Past Surgical History  Procedure Laterality Date  . Tonsillectomy      reports that she has never smoked. She has never used smokeless tobacco. She reports that she does not drink alcohol or use illicit drugs. family history includes Breast cancer in her maternal grandmother; Heart disease in her father, maternal grandfather, and maternal grandmother; Kidney cancer in her maternal aunt; Lung cancer in her paternal grandfather; Pancreatic cancer in an other family member. Allergies  Allergen Reactions  . Azithromycin   . Hydrocodone     REACTION: GI upset  . Morphine    Current Outpatient Prescriptions on File Prior to Visit  Medication Sig Dispense Refill  . norethindrone-ethinyl estradiol (JUNEL FE,GILDESS FE,LOESTRIN FE) 1-20 MG-MCG tablet Take 1 tablet by mouth daily.    . fexofenadine (ALLEGRA) 180 MG tablet Take 1 tablet (180 mg total) by mouth daily. (Patient not taking: Reported on 06/17/2014) 30 tablet 11  . fluticasone (FLONASE) 50 MCG/ACT nasal spray Place 2 sprays into both nostrils daily. (Patient not taking: Reported on 06/17/2014)  16 g 3  . levofloxacin (LEVAQUIN) 500 MG tablet Take 1 tablet (500 mg total) by mouth daily. (Patient not taking: Reported on 06/17/2014) 10 tablet 0  . triamcinolone (KENALOG) 0.1 % paste Use as directed 1 application in the mouth or throat 2 (two) times daily. (Patient not taking: Reported on 06/17/2014) 5 g 12  . triamcinolone cream (KENALOG) 0.1 % Apply topically 2 (two) times daily. (Patient not taking: Reported on 06/17/2014) 30 g 1   No current facility-administered medications on file prior to visit.   Review of Systems  Constitutional: Negative for unusual diaphoresis or night sweats HENT: Negative for ringing in ear or discharge Eyes: Negative for double vision or worsening visual disturbance.  Respiratory: Negative for choking and stridor.   Gastrointestinal: Negative for vomiting Genitourinary: Negative for hematuria or change in urine volume.  Musculoskeletal: Negative for other MSK pain or swelling Skin: Negative for color change and worsening wound.  Neurological: Negative for tremors and numbness other than noted  Psychiatric/Behavioral: Negative for decreased concentration or agitation other than above       Objective:   Physical Exam BP 102/60 mmHg  Pulse 76  Temp(Src) 98.5 F (36.9 C) (Oral)  Wt 120 lb 1.3 oz (54.468 kg)  SpO2 97% VS noted,  Constitutional: Pt appears in no significant distress HENT: Head: NCAT.  Right Ear: External ear normal.  Left Ear: External ear normal.  Eyes: . Pupils are equal, round, and reactive to light. Conjunctivae and EOM are normal Neck: Normal range of motion. Neck supple.  Cardiovascular: Normal rate and regular rhythm.   Pulmonary/Chest: Effort normal and breath sounds without rales or wheezing.  Abd:  Soft, diffuse tender mild worse RLQ, ND, + BS, no guarding or rebound Neurological: Pt is alert. Not confused , motor grossly intact Skin: Skin is warm. No rash, no LE edema Psychiatric: Pt behavior is normal. No agitation.       Assessment & Plan:

## 2014-06-17 NOTE — Assessment & Plan Note (Signed)
[place] for increased lexapro to 15 qd,  to f/u any worsening symptoms or concerns

## 2014-06-24 ENCOUNTER — Other Ambulatory Visit (INDEPENDENT_AMBULATORY_CARE_PROVIDER_SITE_OTHER): Payer: BLUE CROSS/BLUE SHIELD

## 2014-06-24 DIAGNOSIS — R197 Diarrhea, unspecified: Secondary | ICD-10-CM

## 2014-06-24 DIAGNOSIS — R109 Unspecified abdominal pain: Secondary | ICD-10-CM

## 2014-06-24 DIAGNOSIS — F419 Anxiety disorder, unspecified: Secondary | ICD-10-CM

## 2014-06-24 DIAGNOSIS — G43809 Other migraine, not intractable, without status migrainosus: Secondary | ICD-10-CM | POA: Diagnosis not present

## 2014-06-24 LAB — CBC WITH DIFFERENTIAL/PLATELET
Basophils Absolute: 0 10*3/uL (ref 0.0–0.1)
Basophils Relative: 0.4 % (ref 0.0–3.0)
EOS ABS: 0.1 10*3/uL (ref 0.0–0.7)
EOS PCT: 1.2 % (ref 0.0–5.0)
HCT: 44.6 % (ref 36.0–46.0)
Hemoglobin: 15.2 g/dL — ABNORMAL HIGH (ref 12.0–15.0)
LYMPHS ABS: 2.7 10*3/uL (ref 0.7–4.0)
Lymphocytes Relative: 33.5 % (ref 12.0–46.0)
MCHC: 34.2 g/dL (ref 30.0–36.0)
MCV: 90 fl (ref 78.0–100.0)
MONO ABS: 0.5 10*3/uL (ref 0.1–1.0)
Monocytes Relative: 6.8 % (ref 3.0–12.0)
Neutro Abs: 4.7 10*3/uL (ref 1.4–7.7)
Neutrophils Relative %: 58.1 % (ref 43.0–77.0)
Platelets: 309 10*3/uL (ref 150.0–400.0)
RBC: 4.95 Mil/uL (ref 3.87–5.11)
RDW: 12.5 % (ref 11.5–15.5)
WBC: 8.1 10*3/uL (ref 4.0–10.5)

## 2014-06-24 LAB — URINALYSIS, ROUTINE W REFLEX MICROSCOPIC
Bilirubin Urine: NEGATIVE
Ketones, ur: NEGATIVE
Leukocytes, UA: NEGATIVE
NITRITE: NEGATIVE
Specific Gravity, Urine: 1.01 (ref 1.000–1.030)
TOTAL PROTEIN, URINE-UPE24: NEGATIVE
UROBILINOGEN UA: 0.2 (ref 0.0–1.0)
Urine Glucose: NEGATIVE
pH: 6 (ref 5.0–8.0)

## 2014-06-24 LAB — BASIC METABOLIC PANEL
BUN: 9 mg/dL (ref 6–23)
CALCIUM: 9.7 mg/dL (ref 8.4–10.5)
CO2: 28 mEq/L (ref 19–32)
Chloride: 103 mEq/L (ref 96–112)
Creatinine, Ser: 0.69 mg/dL (ref 0.40–1.20)
GFR: 113.95 mL/min (ref >60.00)
Glucose, Bld: 90 mg/dL (ref 70–99)
Potassium: 3.8 mEq/L (ref 3.5–5.1)
Sodium: 135 mEq/L (ref 135–145)

## 2014-06-24 LAB — HEPATIC FUNCTION PANEL
ALK PHOS: 61 U/L (ref 39–117)
ALT: 11 U/L (ref 0–35)
AST: 18 U/L (ref 0–37)
Albumin: 4.4 g/dL (ref 3.5–5.2)
BILIRUBIN TOTAL: 1.8 mg/dL — AB (ref 0.2–1.2)
Bilirubin, Direct: 0.3 mg/dL (ref 0.0–0.3)
TOTAL PROTEIN: 7.7 g/dL (ref 6.0–8.3)

## 2014-06-24 LAB — SEDIMENTATION RATE: Sed Rate: 8 mm/hr (ref 0–22)

## 2014-06-24 LAB — LIPASE: LIPASE: 17 U/L (ref 11.0–59.0)

## 2014-06-24 LAB — C-REACTIVE PROTEIN: CRP: 0.2 mg/dL — ABNORMAL LOW (ref 0.5–20.0)

## 2014-06-24 LAB — TSH: TSH: 1.41 u[IU]/mL (ref 0.35–4.50)

## 2014-07-16 ENCOUNTER — Encounter: Payer: Self-pay | Admitting: Internal Medicine

## 2014-07-26 ENCOUNTER — Encounter: Payer: Self-pay | Admitting: Gastroenterology

## 2014-09-16 ENCOUNTER — Ambulatory Visit (INDEPENDENT_AMBULATORY_CARE_PROVIDER_SITE_OTHER): Payer: BLUE CROSS/BLUE SHIELD | Admitting: Internal Medicine

## 2014-09-16 ENCOUNTER — Encounter: Payer: Self-pay | Admitting: Internal Medicine

## 2014-09-16 VITALS — BP 104/60 | HR 92 | Temp 98.8°F | Ht 67.0 in | Wt 117.0 lb

## 2014-09-16 DIAGNOSIS — F419 Anxiety disorder, unspecified: Secondary | ICD-10-CM

## 2014-09-16 DIAGNOSIS — G43109 Migraine with aura, not intractable, without status migrainosus: Secondary | ICD-10-CM | POA: Diagnosis not present

## 2014-09-16 DIAGNOSIS — F329 Major depressive disorder, single episode, unspecified: Secondary | ICD-10-CM | POA: Diagnosis not present

## 2014-09-16 DIAGNOSIS — F32A Depression, unspecified: Secondary | ICD-10-CM

## 2014-09-16 MED ORDER — RIZATRIPTAN BENZOATE 10 MG PO TABS: 10.0000 mg | ORAL_TABLET | ORAL | Status: DC | PRN

## 2014-09-16 MED ORDER — NAPROXEN 500 MG PO TABS: 500.0000 mg | ORAL_TABLET | Freq: Two times a day (BID) | ORAL | Status: DC

## 2014-09-16 NOTE — Assessment & Plan Note (Signed)
stable overall by history and exam, recent data reviewed with pt, and pt to continue medical treatment as before,  to f/u any worsening symptoms or concerns Lab Results  Component Value Date   WBC 8.1 06/24/2014   HGB 15.2* 06/24/2014   HCT 44.6 06/24/2014   PLT 309.0 06/24/2014   GLUCOSE 90 06/24/2014   ALT 11 06/24/2014   AST 18 06/24/2014   NA 135 06/24/2014   K 3.8 06/24/2014   CL 103 06/24/2014   CREATININE 0.69 06/24/2014   BUN 9 06/24/2014   CO2 28 06/24/2014   TSH 1.41 06/24/2014

## 2014-09-16 NOTE — Assessment & Plan Note (Signed)
Likely mild persistent, declines referral for counseling, to cont lexapro,  to f/u any worsening symptoms or concerns

## 2014-09-16 NOTE — Assessment & Plan Note (Signed)
Worsening freq and severity, try add naproxn to maxalt prn, refer neurology, letter given for work, may also need FMLA

## 2014-09-16 NOTE — Progress Notes (Signed)
Subjective:    Patient ID: Mary Cochran, female    DOB: [birth year], 19 y.o.   MRN: [id number]  HPI  Here to f/u with worsening freq and severity of typical migraine with aura and right lateral vision loss often such that she cannot drive, lasts hours to days with nausea, no vomiting, using a cool compress on the neck can help somewhat, imitrex helps sometimes, but not as well previous and now each migraine more severe and now up to 5 times per month, has missed mult days of work, needs note and possibly even FMLA form.  Pt denies new neurological symptoms such as new headache, or facial or extremity weakness or numbness other than above  Last episode left her incapacitated at work, she is just mystified as she did not seem to have stress on vacation. Denies worsening depressive symptoms, suicidal ideation, or panic Past Medical History  Diagnosis Date  . ALLERGIC RHINITIS 12/12/2006  . GILBERT'S SYNDROME 03/07/2010  . INSOMNIA-SLEEP DISORDER-UNSPEC 04/18/2007  . Depression 10/03/2011   Past Surgical History  Procedure Laterality Date  . Tonsillectomy      reports that she has never smoked. She has never used smokeless tobacco. She reports that she does not drink alcohol or use illicit drugs. family history includes Breast cancer in her maternal grandmother; Heart disease in her father, maternal grandfather, and maternal grandmother; Kidney cancer in her maternal aunt; Lung cancer in her paternal grandfather; Pancreatic cancer in an other family member. Allergies  Allergen Reactions  . Azithromycin   . Hydrocodone     REACTION: GI upset  . Morphine    Current Outpatient Prescriptions on File Prior to Visit  Medication Sig Dispense Refill  . escitalopram (LEXAPRO) 10 MG tablet TAKE 1 and 1/2 TABLET BY MOUTH EVERY DAY 135 tablet 3  . SUMAtriptan (IMITREX) 100 MG tablet Take 1 tablet (100 mg total) by mouth every 2 (two) hours as needed for migraine or headache. May repeat in 2 hours if  headache persists or recurs. 10 tablet 5  . norethindrone-ethinyl estradiol (JUNEL FE,GILDESS FE,LOESTRIN FE) 1-20 MG-MCG tablet Take 1 tablet by mouth daily.     No current facility-administered medications on file prior to visit.   Review of Systems  Constitutional: Negative for unusual diaphoresis or night sweats HENT: Negative for ringing in ear or discharge Eyes: Negative for double vision or worsening visual disturbance.  Respiratory: Negative for choking and stridor.   Gastrointestinal: Negative for vomiting or other signifcant bowel change Genitourinary: Negative for hematuria or change in urine volume.  Musculoskeletal: Negative for other MSK pain or swelling Skin: Negative for color change and worsening wound.  Neurological: Negative for tremors and numbness other than noted  Psychiatric/Behavioral: Negative for decreased concentration or agitation other than above       Objective:   Physical Exam BP 104/60 mmHg  Pulse 92  Temp(Src) 98.8 F (37.1 C) (Oral)  Ht 5\' 7"  (1.702 m)  Wt 117 lb (53.071 kg)  BMI 18.32 kg/m2  SpO2 97% VS noted,  Constitutional: Pt appears in no significant distress HENT: Head: NCAT.  Right Ear: External ear normal.  Left Ear: External ear normal.  Eyes: . Pupils are equal, round, and reactive to light. Conjunctivae and EOM are normal Neck: Normal range of motion. Neck supple.  Cardiovascular: Normal rate and regular rhythm.   Pulmonary/Chest: Effort normal and breath sounds without rales or wheezing.  Abd:  Soft, NT, ND, + BS Neurological: Pt is alert. Not  confused , motor grossly intact Skin: Skin is warm. No rash, no LE edema Psychiatric: Pt behavior is normal. No agitation. 1+ nervous, not depressed affect    Assessment & Plan:

## 2014-09-16 NOTE — Progress Notes (Signed)
Pre visit review using our clinic review tool, if applicable. No additional management support is needed unless otherwise documented below in the visit note. 

## 2014-09-16 NOTE — Patient Instructions (Signed)
OK to stop the imitrex  Please take all new medication as prescribed - the maxalt and naproxen as prescribed  Please continue all other medications as before, and refills have been done if requested.  Please have the pharmacy call with any other refills you may need.  Please keep your appointments with your specialists as you may have planned  You are given the work note  You may wish to obtain and FMLA form as well  You will be contacted regarding the referral for: Neurology

## 2014-09-30 ENCOUNTER — Encounter: Payer: Self-pay | Admitting: Gastroenterology

## 2014-09-30 ENCOUNTER — Ambulatory Visit (INDEPENDENT_AMBULATORY_CARE_PROVIDER_SITE_OTHER): Payer: BLUE CROSS/BLUE SHIELD | Admitting: Gastroenterology

## 2014-09-30 VITALS — BP 104/74 | HR 20 | Ht 66.54 in | Wt 119.0 lb

## 2014-09-30 DIAGNOSIS — R1032 Left lower quadrant pain: Secondary | ICD-10-CM | POA: Diagnosis not present

## 2014-09-30 DIAGNOSIS — K921 Melena: Secondary | ICD-10-CM

## 2014-09-30 DIAGNOSIS — R194 Change in bowel habit: Secondary | ICD-10-CM

## 2014-09-30 DIAGNOSIS — R1031 Right lower quadrant pain: Secondary | ICD-10-CM

## 2014-09-30 MED ORDER — NA SULFATE-K SULFATE-MG SULF 17.5-3.13-1.6 GM/177ML PO SOLN: 1.0000 | Freq: Once | ORAL | Status: DC

## 2014-09-30 MED ORDER — GLYCOPYRROLATE 1 MG PO TABS: 1.0000 mg | ORAL_TABLET | Freq: Two times a day (BID) | ORAL | Status: DC

## 2014-09-30 NOTE — Patient Instructions (Signed)
We have sent the following medications to your pharmacy for you to pick up at your convenience:Robinul.   You have been scheduled for a colonoscopy. Please follow written instructions given to you at your visit today.  Please pick up your prep supplies at the pharmacy within the next 1-3 days. If you use inhalers (even only as needed), please bring them with you on the day of your procedure.  You can try a probiotic over the counter such as Florastor, [employer] or Restora.   Thank you for choosing me and [facility].  Pricilla Riffle. Dagoberto Ligas., MD., Marval Regal  cc: Cathlean Cower, MD

## 2014-09-30 NOTE — Progress Notes (Signed)
    History of Present Illness: This is a 19-year-old female referred by John, James W, MD for the evaluation of lower abdominal pain, nausea, hematochezia, diarrhea and constipation. She relates an intermittent history of lower abdominal pain associated with either constipation or diarrhea for about the past 2-3 years. These symptoms have increased in severity and frequency over the past 2-3 months. She cannot clearly determine any foods that precipitate symptoms. She has noted small amounts of bright red blood per rectum with bowel movements. She has used MiraLAX as needed and Gas-X as needed with some benefit. Blood work including ESR and CRP from May 2016 was unremarkable. Denies weight loss, change in stool caliber, melena, vomiting, dysphagia, reflux symptoms, chest pain.  Review of Systems: Pertinent positive and negative review of systems were noted in the above HPI section. All other review of systems were otherwise negative.  Current Medications, Allergies, Past Medical History, Past Surgical History, Family History and Social History were reviewed in [facility] electronic medical record.  Physical Exam: General: Well developed, well nourished, no acute distress Head: Normocephalic and atraumatic Eyes:  sclerae anicteric, EOMI Ears: Normal auditory acuity Mouth: No deformity or lesions Neck: Supple, no masses or thyromegaly Lungs: Clear throughout to auscultation Heart: Regular rate and rhythm; no murmurs, rubs or bruits Abdomen: Soft, mild suprapubic tenderness and non distended. No masses, hepatosplenomegaly or hernias noted. Normal Bowel sounds Rectal: Deferred to colonoscopy Musculoskeletal: Symmetrical with no gross deformities  Skin: No lesions on visible extremities Pulses:  Normal pulses noted Extremities: No clubbing, cyanosis, edema or deformities noted Neurological: Alert oriented x 4, grossly nonfocal Cervical Nodes:  No significant cervical adenopathy Inguinal  Nodes: No significant inguinal adenopathy Psychological:  Alert and cooperative. Normal mood and affect  Assessment and Recommendations:  1. Lower abdominal pain, nausea, alternating diarrhea and constipation, small volume hematochezia. I suspect she has irritable bowel syndrome with a benign anorectal source of bleeding such as hemorrhoids however IBD and other etiologies need to be excluded. Obtain IgA and tTG. Trial of glycopyrrolate 1 mg twice daily. Begin a lactose free diet for 2 weeks. Minimize raw fruits and vegetables for 2 weeks. Trial of at least 3 different probiotics for 1-2 weeks each. MiraLAX daily as needed for constipation. Gas-X qid when necessary. Schedule colonoscopy. The risks (including bleeding, perforation, infection, missed lesions, medication reactions and possible hospitalization or surgery if complications occur), benefits, and alternatives to colonoscopy with possible biopsy and possible polypectomy were discussed with the patient and they consent to proceed.    cc: James W John, MD [address] [place], [place] [address] 

## 2014-10-04 ENCOUNTER — Telehealth: Payer: Self-pay | Admitting: Internal Medicine

## 2014-10-04 NOTE — Telephone Encounter (Signed)
Pt dropped off her ADA to be signed by [name] on the 19th.  Has this been signed yet. It is due on the 31st. There is a fax number on form so it can be faxed when it is signed Can you please call her when it is done at [phone]

## 2014-10-05 NOTE — Telephone Encounter (Signed)
Paperwork faxed and left in cabinet for pt pick up. Called pt, line busy x 2

## 2014-10-05 NOTE — Telephone Encounter (Signed)
Pt informed via VM, letter in cabinet for pt pick up

## 2014-10-07 DIAGNOSIS — D126 Benign neoplasm of colon, unspecified: Secondary | ICD-10-CM

## 2014-10-07 HISTORY — DX: Benign neoplasm of colon, unspecified: D12.6

## 2014-10-12 ENCOUNTER — Encounter: Payer: Self-pay | Admitting: Gastroenterology

## 2014-10-12 ENCOUNTER — Ambulatory Visit (AMBULATORY_SURGERY_CENTER): Payer: BLUE CROSS/BLUE SHIELD | Admitting: Gastroenterology

## 2014-10-12 VITALS — BP 106/53 | HR 59 | Temp 98.6°F | Resp 33 | Ht 66.53 in | Wt 119.0 lb

## 2014-10-12 DIAGNOSIS — R194 Change in bowel habit: Secondary | ICD-10-CM | POA: Diagnosis not present

## 2014-10-12 DIAGNOSIS — R1031 Right lower quadrant pain: Secondary | ICD-10-CM | POA: Diagnosis not present

## 2014-10-12 DIAGNOSIS — K921 Melena: Secondary | ICD-10-CM

## 2014-10-12 DIAGNOSIS — D120 Benign neoplasm of cecum: Secondary | ICD-10-CM | POA: Diagnosis not present

## 2014-10-12 DIAGNOSIS — R1032 Left lower quadrant pain: Secondary | ICD-10-CM

## 2014-10-12 MED ORDER — SODIUM CHLORIDE 0.9 % IV SOLN: 500.0000 mL | INTRAVENOUS | Status: DC

## 2014-10-12 NOTE — Op Note (Signed)
[facility]  [address] [place], [id number]   COLONOSCOPY PROCEDURE REPORT  PATIENT: Mary Cochran, Mary Cochran  MR#: [id number] BIRTHDATE: [birth year] , 19  yrs. old GENDER: female ENDOSCOPIST: Ladene Artist, MD, [facility] REFERRED YE:MVVKP John, M.D. PROCEDURE DATE:  10/12/2014 PROCEDURE:   Colonoscopy, diagnostic and Colonoscopy with snare polypectomy First Screening Colonoscopy - Avg.  risk and is 50 yrs.  old or older - No.  Prior Negative Screening - Now for repeat screening. N/A  History of Adenoma - Now for follow-up colonoscopy & has been > or = to 3 yrs.  N/A  Polyps removed today? Yes ASA CLASS:   Class II INDICATIONS:Evaluation of unexplained GI bleeding, Colorectal Neoplasm Risk Assessment for this procedure is average risk, hematochezia, change in bowel habits, and lower abdominal pain. MEDICATIONS: Monitored anesthesia care and Propofol 400 mg IV DESCRIPTION OF PROCEDURE:   After the risks benefits and alternatives of the procedure were thoroughly explained, informed consent was obtained.  The digital rectal exam revealed no abnormalities of the rectum.   The LB [id number]  endoscope was introduced through the anus and advanced to the terminal ileum which was intubated for a short distance. No adverse events experienced.   The quality of the prep was excellent.  (Suprep was used)  The instrument was then slowly withdrawn as the colon was fully examined. Estimated blood loss is zero unless otherwise noted in this procedure report.    COLON FINDINGS: A sessile polyp measuring 6 mm in size was found at the cecum.  A polypectomy was performed with a cold snare.  The resection was complete, the polyp tissue was completely retrieved and sent to histology.   The examination was otherwise normal. The examined terminal ileum appeared to be normal.  Retroflexed views revealed no abnormalities. The time to cecum = 3.4 Withdrawal time = 10.5   The scope was  withdrawn and the procedure completed. COMPLICATIONS: There were no immediate complications.  ENDOSCOPIC IMPRESSION: 1.   Sessile polyp found at the cecum; polypectomy performed with a cold snare 2.   The examination was otherwise normal 3.   The examined terminal ileum appeared to be normal  RECOMMENDATIONS: 1.  Await pathology results 2.  Repeat colonoscopy in 5 years if polyp adenomatous; otherwise at age 68 3.  Office appt in 6 weeks  eSigned:  Ladene Artist, MD, [facility] 10/12/2014 3:20 PM

## 2014-10-12 NOTE — Progress Notes (Signed)
Called to room to assist during endoscopic procedure.  Patient ID and intended procedure confirmed with present staff. Received instructions for my participation in the procedure from the performing physician.  

## 2014-10-12 NOTE — Patient Instructions (Signed)
YOU HAD AN ENDOSCOPIC PROCEDURE TODAY AT [facility]:   Refer to the procedure report that was given to you for any specific questions about what was found during the examination.  If the procedure report does not answer your questions, please call your gastroenterologist to clarify.  If you requested that your care partner not be given the details of your procedure findings, then the procedure report has been included in a sealed envelope for you to review at your convenience later.  YOU SHOULD EXPECT: Some feelings of bloating in the abdomen. Passage of more gas than usual.  Walking can help get rid of the air that was put into your GI tract during the procedure and reduce the bloating. If you had a lower endoscopy (such as a colonoscopy or flexible sigmoidoscopy) you may notice spotting of blood in your stool or on the toilet paper. If you underwent a bowel prep for your procedure, you may not have a normal bowel movement for a few days.  Please Note:  You might notice some irritation and congestion in your nose or some drainage.  This is from the oxygen used during your procedure.  There is no need for concern and it should clear up in a day or so.  SYMPTOMS TO REPORT IMMEDIATELY:   Following lower endoscopy (colonoscopy or flexible sigmoidoscopy):  Excessive amounts of blood in the stool  Significant tenderness or worsening of abdominal pains  Swelling of the abdomen that is new, acute  Fever of 100F or higher   For urgent or emergent issues, a gastroenterologist can be reached at any hour by calling [phone].   DIET: Your first meal following the procedure should be a small meal and then it is ok to progress to your normal diet. Heavy or fried foods are harder to digest and may make you feel nauseous or bloated.  Likewise, meals heavy in dairy and vegetables can increase bloating.  Drink plenty of fluids but you should avoid alcoholic beverages for 24  hours.  ACTIVITY:  You should plan to take it easy for the rest of today and you should NOT DRIVE or use heavy machinery until tomorrow (because of the sedation medicines used during the test).    FOLLOW UP: Our staff will call the number listed on your records the next business day following your procedure to check on you and address any questions or concerns that you may have regarding the information given to you following your procedure. If we do not reach you, we will leave a message.  However, if you are feeling well and you are not experiencing any problems, there is no need to return our call.  We will assume that you have returned to your regular daily activities without incident.  If any biopsies were taken you will be contacted by phone or by letter within the next 1-3 weeks.  Please call us at [phone] if you have not heard about the biopsies in 3 weeks.    SIGNATURES/CONFIDENTIALITY: You and/or your care partner have signed paperwork which will be entered into your electronic medical record.  These signatures attest to the fact that that the information above on your After Visit Summary has been reviewed and is understood.  Full responsibility of the confidentiality of this discharge information lies with you and/or your care-partner.  Await pathology report Repeat Colonoscopy in 5 years Polyp handout given

## 2014-10-12 NOTE — Progress Notes (Signed)
Report to PACU, RN, vss, BBS= Clear.  

## 2014-10-13 ENCOUNTER — Telehealth: Payer: Self-pay | Admitting: *Deleted

## 2014-10-13 NOTE — Telephone Encounter (Signed)
Message left

## 2014-10-20 ENCOUNTER — Encounter: Payer: Self-pay | Admitting: Gastroenterology

## 2014-11-11 ENCOUNTER — Encounter: Payer: Self-pay | Admitting: Neurology

## 2014-11-11 ENCOUNTER — Ambulatory Visit (INDEPENDENT_AMBULATORY_CARE_PROVIDER_SITE_OTHER): Payer: BLUE CROSS/BLUE SHIELD | Admitting: Neurology

## 2014-11-11 VITALS — BP 120/68 | HR 80 | Ht 67.0 in | Wt 120.3 lb

## 2014-11-11 DIAGNOSIS — M542 Cervicalgia: Secondary | ICD-10-CM

## 2014-11-11 DIAGNOSIS — G43109 Migraine with aura, not intractable, without status migrainosus: Secondary | ICD-10-CM

## 2014-11-11 DIAGNOSIS — M25519 Pain in unspecified shoulder: Secondary | ICD-10-CM | POA: Diagnosis not present

## 2014-11-11 MED ORDER — TIZANIDINE HCL 2 MG PO CAPS: 2.0000 mg | ORAL_CAPSULE | Freq: Every evening | ORAL | Status: DC | PRN

## 2014-11-11 MED ORDER — TOPIRAMATE 25 MG PO TABS: ORAL_TABLET | ORAL | Status: DC

## 2014-11-11 NOTE — Patient Instructions (Signed)
Migraine Recommendations: 1.  Start topiramate 25mg  at bedtime for 7 days, then increase dose to 2 tablets at bedtime (50mg  total).  Call in 4 weeks with update and we can adjust dose if needed. We will start topiramate (Topamax) 50mg  tablets.  We will increase the dose as follows to goal of 100mg  twice daily:  Possible side effects include: impaired thinking, sedation, paresthesias (numbness and tingling) and weight loss.  It may cause dehydration and there is a small risk for kidney stones, so make sure to stay hydrated with water during the day.  There is also a very small risk for glaucoma, so if you notice any change in your vision while taking this medication, see an ophthalmologist.    2.  Take Maxalt with naproxen 500mg  at earliest onset of headache (not the aura).  May repeat dose of Maxalt (not naproxen) once in 2 hours if needed.  Do not exceed two tablets in 24 hours. 3.  Limit use of pain relievers to no more than 2 days out of the week.  These medications include acetaminophen, ibuprofen, triptans and narcotics.  This will help reduce risk of rebound headaches. 4.  Be aware of common food triggers such as processed sweets, processed foods with nitrites (such as deli meat, hot dogs, sausages), foods with MSG, alcohol (such as wine), chocolate, certain cheeses, certain fruits (dried fruits, some citrus fruit), vinegar, diet soda. 4.  Avoid caffeine 5.  Routine exercise 6.  Proper sleep hygiene 7.  Stay adequately hydrated with water 8.  Keep a headache diary. 9.  Maintain proper stress management. 10.  Do not skip meals. 11.  Consider supplements:  Magnesium oxide 400mg  to 600mg  daily, riboflavin 400mg , Coenzyme Q 10 100mg  three times daily 12.  Follow up in 3 months.  CALL IN 4 WEEKS WITH UPDATE

## 2014-11-11 NOTE — Progress Notes (Signed)
NEUROLOGY CONSULTATION NOTE  SHABRIA EGLEY MRN: [id number] DOB: 09-21-1995  Referring provider: [name] Primary care provider: [name]  Reason for consult:  migraine  HISTORY OF PRESENT ILLNESS: Mary Cochran is a 19 year old right-handed female with Gilbert's syndrome, IBS and depression and anxiety who presents for migraine.  History obtained by patient and PCP note.  Labs reviewed.  Onset:  Around age 18.   Location:  holocephalic Quality:  pressure Intensity:  8/10 Aura:  Preceded by visual aura "like a lava lamp" in the temporal half of visual field in right eye, lasting 30 to 60 minutes Prodrome:  no Associated symptoms:  Photophobia, phonophobia, sometimes nausea.  No vomiting. Duration:  2-3 hours Frequency:  2 to 4 days a month Triggers/exacerbating factors:  Stress, neck and shoulder pain Relieving factors:  Laying down in dark, cold compress on back of head while sitting in warm bath Activity:  Cannot function  Past abortive medication:  sumatriptan 100mg , Advil, Excedrin Past preventative medication:  none Other past therapy:  none  Current abortive medication:  Maxalt 10mg , naproxen 500mg  Antihypertensive medications:  none Antidepressant medications:  Lexapro 15mg  Anticonvulsant medications:  none Vitamins/Herbal/Supplements:  none Other therapy:  none Other medication:  Lo Loestrin Fe  Caffeine:  Hot tea daily.  Tries to avoid due to IBS Alcohol:  no Smoker:  no Diet:  Tries to eat healthy.  Drinks water Exercise:  no Depression/stress:  varies Sleep hygiene:  good Family history of headache:  Mother  Recent labs, including CBC, BMP, LFTs, TSH, Sed rate, were unremarkable.  PAST MEDICAL HISTORY: Past Medical History  Diagnosis Date  . ALLERGIC RHINITIS 12/12/2006  . GILBERT'S SYNDROME 03/07/2010  . INSOMNIA-SLEEP DISORDER-UNSPEC 04/18/2007  . Depression 10/03/2011  . Anxiety   . Allergy     SEASONAL    PAST SURGICAL HISTORY: Past  Surgical History  Procedure Laterality Date  . Tonsillectomy    . Mouth surgery      MEDICATIONS: Current Outpatient Prescriptions on File Prior to Visit  Medication Sig Dispense Refill  . escitalopram (LEXAPRO) 10 MG tablet TAKE 1 and 1/2 TABLET BY MOUTH EVERY DAY 135 tablet 3  . ibuprofen (ADVIL,MOTRIN) 200 MG tablet Take 200 mg by mouth every 6 (six) hours as needed.    . LO LOESTRIN FE 1 MG-10 MCG / 10 MCG tablet   11  . Multiple Vitamins-Minerals (MULTIVITAMIN ADULT PO) Take by mouth.    . naproxen (NAPROSYN) 500 MG tablet Take 500 mg by mouth 2 (two) times daily with a meal.  2  . rizatriptan (MAXALT) 10 MG tablet Take 1 tablet (10 mg total) by mouth as needed for migraine. May repeat in 2 hours if needed 10 tablet 11   No current facility-administered medications on file prior to visit.    ALLERGIES: Allergies  Allergen Reactions  . Azithromycin   . Hydrocodone     REACTION: GI upset  . Morphine     FAMILY HISTORY: Family History  Problem Relation Age of Onset  . Breast cancer Maternal Grandmother   . Heart disease Maternal Grandmother   . Pancreatic cancer      M . great grandfather  . Lung cancer Paternal Grandfather   . Kidney cancer Maternal Aunt   . Heart disease Father   . Heart disease Maternal Grandfather     SOCIAL HISTORY: Social History   Social History  . Marital Status: Single    Spouse Name: N/A  . Number  of Children: N/A  . 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 Smoking status: Never Smoker   . Smokeless tobacco: Never Used  . Alcohol Use: No     Comment: OCC.  . Drug Use: No  . Sexual Activity: Not on file   Other Topics Concern  . Not on file   Social History Narrative    REVIEW OF SYSTEMS: Constitutional: No fevers, chills, or sweats, no generalized fatigue, change in appetite Eyes: No visual changes, double vision, eye pain Ear, nose and throat: No hearing  loss, ear pain, nasal congestion, sore throat Cardiovascular: No chest pain, palpitations Respiratory:  No shortness of breath at rest or with exertion, wheezes GastrointestinaI: Sometimes stomach upset Genitourinary:  No dysuria, urinary retention or frequency Musculoskeletal:  Neck and shoulder pain Integumentary: No rash, pruritus, skin lesions Neurological: as above Psychiatric: sometimes depression and anxiety Endocrine: No palpitations, fatigue, diaphoresis, mood swings, change in appetite, change in weight, increased thirst Hematologic/Lymphatic:  No anemia, purpura, petechiae. Allergic/Immunologic: no itchy/runny eyes, nasal congestion, recent allergic reactions, rashes  PHYSICAL EXAM: Filed Vitals:   11/11/14 0958  BP: 120/68  Pulse: 80   General: No acute distress.  Patient appears well-groomed. Head:  Normocephalic/atraumatic Eyes:  fundi unremarkable, without vessel changes, exudates, hemorrhages or papilledema. Neck: supple, bilateral tenderness, full range of motion Back: No paraspinal tenderness Heart: regular rate and rhythm Lungs: Clear to auscultation bilaterally. Vascular: No carotid bruits. Neurological Exam: Mental status: alert and oriented to person, place, and time, recent and remote memory intact, fund of knowledge intact, attention and concentration intact, speech fluent and not dysarthric, language intact. Cranial nerves: CN I: not tested CN II: pupils equal, round and reactive to light, visual fields intact, fundi unremarkable, without vessel changes, exudates, hemorrhages or papilledema. CN III, IV, VI:  full range of motion, no nystagmus, no ptosis CN V: facial sensation intact CN VII: upper and lower face symmetric CN VIII: hearing intact CN IX, X: gag intact, uvula midline CN XI: sternocleidomastoid and trapezius muscles intact CN XII: tongue midline Bulk & Tone: normal, no fasciculations. Motor:  5/5 throughout Sensation: temperature and  vibration sensation intact. Deep Tendon Reflexes:  2+ throughout, toes downgoing.  Finger to nose testing:  Without dysmetria.  Heel to shin:  Without dysmetria.  Gait:  Normal station and stride.  Able to turn and tandem walk. Romberg negative.  IMPRESSION: Migraine with visual aura Neck and shoulder pain, myofascial  PLAN: Start topiramate 25mg  at bedtime for 7 days, then 50mg  at bedtime.  Discussed side effects including lowering efficacy of birth control (but usually at much higher doses) Take Maxalt with naproxen 500mg  at earliest onset of headache (not aura).  May repeat Maxalt alone in 2 hours if needed Tizanidine 2mg  at bedtime as needed for neck and shoulder pain Discussed lifestyle modification Follow up in 3 months.  She is to call in 4 weeks with update.   Thank you for allowing me to take part in the care of this patient.  Metta Clines, DO  CC: Cathlean Cower, MD

## 2014-12-01 ENCOUNTER — Ambulatory Visit (INDEPENDENT_AMBULATORY_CARE_PROVIDER_SITE_OTHER): Payer: BLUE CROSS/BLUE SHIELD | Admitting: Gastroenterology

## 2014-12-01 ENCOUNTER — Encounter: Payer: Self-pay | Admitting: Gastroenterology

## 2014-12-01 VITALS — BP 86/58 | HR 76 | Ht 66.0 in | Wt 118.4 lb

## 2014-12-01 DIAGNOSIS — Z86010 Personal history of colonic polyps: Secondary | ICD-10-CM | POA: Diagnosis not present

## 2014-12-01 DIAGNOSIS — K589 Irritable bowel syndrome without diarrhea: Secondary | ICD-10-CM | POA: Diagnosis not present

## 2014-12-01 NOTE — Patient Instructions (Signed)
Fill the robinul prescription and take as directed.   A few probiotic suggestions are Align, [employer], or [employer].  Call back in one month and let us know if your symptoms have not improved.   Thank you for choosing me and [facility].  Pricilla Riffle. Dagoberto Ligas., MD., Marval Regal

## 2014-12-01 NOTE — Progress Notes (Signed)
    History of Present Illness: This is a 19 year old female returning for follow-up of a change in bowel habits, hematochezia and lower abdominal pain. She is accompanied by her mother. Colonoscopy performed last month was normal except for one small adenomatous colon polyp. She denies any recurrence of rectal bleeding. Her bowel habits are an urgent bowel movement following meals associated with mild lower abdominal pain. She has not filled Robinul prescription.   Current Medications, Allergies, Past Medical History, Past Surgical History, Family History and Social History were reviewed in [employer] record.  Physical Exam: General: Well developed, well nourished, no acute distress Head: Normocephalic and atraumatic Eyes:  sclerae anicteric, EOMI Ears: Normal auditory acuity Mouth: No deformity or lesions Lungs: Clear throughout to auscultation Heart: Regular rate and rhythm; no murmurs, rubs or bruits Abdomen: Soft, non tender and non distended. No masses, hepatosplenomegaly or hernias noted. Normal Bowel sounds Musculoskeletal: Symmetrical with no gross deformities  Pulses:  Normal pulses noted Extremities: No clubbing, cyanosis, edema or deformities noted Neurological: Alert oriented x 4, grossly nonfocal Psychological:  Alert and cooperative. Normal mood and affect  Assessment and Recommendations:  1. IBS. Begin Robinul twice daily as needed as previously recommended. Begin a daily probiotic. Avoid foods that trigger symptoms. Reassurance provided.   2. Personal history of adenomatous colon polyp. A five-year interval surveillance colonoscopy recommended in September 2021.  I spent 15 minutes of face-to-face time with the patient. Greater than 50% of the time was spent counseling and coordinating care.

## 2014-12-02 ENCOUNTER — Telehealth: Payer: Self-pay

## 2014-12-02 NOTE — Telephone Encounter (Signed)
Mom called to say that patient is possible having a side effect from her increased dosage of Topamax.  Pt called her mom last night around 5 pm saying she had a clinching feeling around her scalp, it did improve some last night with tylenol and rest. Woke up this morning at 5 a.m. With increased clinching  Pain, Patient is nauseous, dizzy, (no vomiting), weakness, and increased pressure in head with deep breaths. Pt states this is not a migraine and she has been having symptoms 2-3 days a week since starting the Topamax. Please advise.   Patient has increased her dosage to two tablets of Topamax 25 mg at bedtime, mother and patient disagree on how long she's been on this dosage, but sounded as if at least a week possible two.   [phone].

## 2014-12-02 NOTE — Telephone Encounter (Signed)
I left a message at the number provided for patient to call back to discuss.

## 2014-12-02 NOTE — Telephone Encounter (Signed)
I spoke with Mary Cochran's mom.  Since increasing dose of topiramate to 50mg , she has had increased headaches.  Yesterday, she started feeling a squeezing sensation on her scalp, along with dizziness and a shock-like feeling shooting throughout her body.  The dizziness causes nausea but no vomiting.  This morning she is a little better but still present.  I advised to stop topiramate and see how she does over the weekend.  Between now and tomorrow, she should call us back if things get worse.  Otherwise, I want her to call back on Monday and if still symptomatic, would recommend MRI of brain.  Over the weekend, if things get worse, she should go to the ED.

## 2014-12-17 ENCOUNTER — Telehealth: Payer: Self-pay | Admitting: Neurology

## 2014-12-17 DIAGNOSIS — R519 Headache, unspecified: Secondary | ICD-10-CM

## 2014-12-17 DIAGNOSIS — R51 Headache: Principal | ICD-10-CM

## 2014-12-17 NOTE — Telephone Encounter (Signed)
Spoke w/ pt. States since coming off of the Topamax she has been experiencing migraines 2 - 3 times a week. Seems to be increasing in Frequency. Pt does use Maxalt to help relieve migraines when they occur. Medication does help. Pt would like to know if there is another daily medication she could try that might not have all the side effects. Would also like to go ahead and set up MRI. Please advise.

## 2014-12-17 NOTE — Telephone Encounter (Signed)
Pt returned your call/ call back @ [phone]

## 2014-12-17 NOTE — Telephone Encounter (Signed)
Set her up for MRI of brain without contrast for worsening headache.  As far as a new daily medication, it will have to be an antidepressant.  Is she willing to switch Lexapro to something else that can also treat migraines.  I would like her to try venlafaxine ER.  First, I would want her to taper off Lexapro to 10mg  daily for 7 days, then stop.  Then, she can start venlafaxine ER 37.5mg  daily for 7 days, then increase to 75mg  daily.   She should not be taking pain relievers more than 2 days out of the week on a regular basis.

## 2014-12-17 NOTE — Telephone Encounter (Signed)
MRI scheduled for 12/19/14 @ 6:40 AM at [address] (GI). Pt aware. Pt would like to get MRI and results before switching medication.

## 2014-12-17 NOTE — Telephone Encounter (Signed)
No answer    Left voicemail

## 2014-12-17 NOTE — Telephone Encounter (Signed)
VM-PT called and said she is having at least 2 migraines a week/Dawn CB# [phone]

## 2014-12-19 ENCOUNTER — Ambulatory Visit
Admission: RE | Admit: 2014-12-19 | Discharge: 2014-12-19 | Disposition: A | Discharge status: Home / Self Care | Payer: BLUE CROSS/BLUE SHIELD | Source: Ambulatory Visit | Attending: Neurology | Admitting: Neurology

## 2014-12-19 DIAGNOSIS — R51 Headache: Principal | ICD-10-CM

## 2014-12-19 DIAGNOSIS — R519 Headache, unspecified: Secondary | ICD-10-CM

## 2014-12-20 ENCOUNTER — Telehealth: Payer: Self-pay

## 2014-12-20 NOTE — Telephone Encounter (Signed)
-----   Message from Pieter Partridge, DO sent at 12/20/2014  7:05 AM EST ----- MRI of brain is normal

## 2014-12-20 NOTE — Telephone Encounter (Signed)
Message relayed to patient. Verbalized understanding and denied questions.   

## 2015-01-05 ENCOUNTER — Telehealth: Payer: Self-pay | Admitting: Neurology

## 2015-01-05 NOTE — Telephone Encounter (Signed)
Pt to call back with Fax number

## 2015-01-05 NOTE — Telephone Encounter (Signed)
Fax number [phone]. Letter faxed.

## 2015-01-05 NOTE — Telephone Encounter (Signed)
Pt called to request a note for work/ explaining her condition/call bacl @ [phone]

## 2015-02-26 ENCOUNTER — Encounter: Payer: Self-pay | Admitting: Gastroenterology

## 2015-03-10 ENCOUNTER — Ambulatory Visit: Payer: Self-pay | Admitting: Neurology

## 2015-03-17 ENCOUNTER — Encounter: Payer: Self-pay | Admitting: Neurology

## 2015-03-17 ENCOUNTER — Ambulatory Visit (INDEPENDENT_AMBULATORY_CARE_PROVIDER_SITE_OTHER): Payer: Self-pay | Admitting: Neurology

## 2015-03-17 VITALS — BP 104/66 | HR 81 | Ht 67.0 in | Wt 117.0 lb

## 2015-03-17 DIAGNOSIS — F419 Anxiety disorder, unspecified: Secondary | ICD-10-CM

## 2015-03-17 DIAGNOSIS — G43109 Migraine with aura, not intractable, without status migrainosus: Secondary | ICD-10-CM

## 2015-03-17 MED ORDER — ZOLMITRIPTAN 5 MG NA SOLN: 1.0000 | NASAL | Status: DC | PRN

## 2015-03-17 NOTE — Progress Notes (Signed)
NEUROLOGY FOLLOW UP OFFICE NOTE  Mary Cochran [id number]  HISTORY OF PRESENT ILLNESS: Mary Cochran is a 20 year old right-handed female with Gilbert's syndrome, IBS and depression and anxiety who follows up for migraine.  UPDATE: When she started topiramate, she developed new severe headache with nausea.  Topamax was discontinued.  MRI of brain was normal.  Over the past month, she began experiencing panic attacks so her Lexapro was increased from 10 to 15mg  daily.  Headaches are unchanged. Intensity:  8/10 Duration:  From 5 hours to all day Frequency:  4 days per month Current NSAIDS:  naproxen 500mg  Current analgesics:  no Current triptans:  Maxalt 10mg  Current anti-nausea:  no Current muscle relaxants:  tizanidine 2mg  Current Antihypertensive medications:  no Current Antidepressant medications:  Lexapro 15mg  daily Current Anticonvulsant medications:  no Current Vitamins/Herbal/Supplements:  MVI Current Antihistamines/Decongestants:  no Other therapy:  no  Caffeine:  Hot tea daily.  Tries to avoid due to IBS Alcohol:  no Smoker:  no Diet:  Tries to eat healthy.  Drinks water Exercise:  no Depression/stress:  varies Sleep hygiene:  good  HISTORY: Onset:  Around age 36.   Location:  holocephalic Quality:  pressure Initial intensity:  8/10 Aura:  Preceded by visual aura "like a lava lamp" in the temporal half of visual field in right eye, lasting 30 to 60 minutes Prodrome:  no Associated symptoms:  Photophobia, phonophobia, sometimes nausea.  No vomiting. Initial Duration:  2-3 hours Initial Frequency:  2 to 4 days a month Triggers/exacerbating factors:  Stress, neck and shoulder pain Relieving factors:  Laying down in dark, cold compress on back of head while sitting in warm bath Activity:  Cannot function  Past abortive medication:  sumatriptan 100mg , Advil, Excedrin Past preventative medication:  Topamax (increased headache) Other past therapy:   none  Family history of headache:  Mother  PAST MEDICAL HISTORY: Past Medical History  Diagnosis Date  . ALLERGIC RHINITIS 12/12/2006  . GILBERT'S SYNDROME 03/07/2010  . INSOMNIA-SLEEP DISORDER-UNSPEC 04/18/2007  . Depression 10/03/2011  . Anxiety   . Allergy     SEASONAL  . Tubular adenoma of colon 10/2014    MEDICATIONS: Current Outpatient Prescriptions on File Prior to Visit  Medication Sig Dispense Refill  . escitalopram (LEXAPRO) 10 MG tablet TAKE 1 and 1/2 TABLET BY MOUTH EVERY DAY 135 tablet 3  . ibuprofen (ADVIL,MOTRIN) 200 MG tablet Take 200 mg by mouth every 6 (six) hours as needed.    . LO LOESTRIN FE 1 MG-10 MCG / 10 MCG tablet   11  . Multiple Vitamin (MULTIVITAMIN) tablet Take 1 tablet by mouth daily.    . Multiple Vitamins-Minerals (MULTIVITAMIN ADULT PO) Take by mouth.    . naproxen (NAPROSYN) 500 MG tablet Take 500 mg by mouth 2 (two) times daily with a meal.  2   No current facility-administered medications on file prior to visit.    ALLERGIES: Allergies  Allergen Reactions  . Azithromycin   . Hydrocodone     REACTION: GI upset  . Morphine     FAMILY HISTORY: Family History  Problem Relation Age of Onset  . Breast cancer Maternal Grandmother   . Heart disease Maternal Grandmother   . Pancreatic cancer      M . great grandfather  . Lung cancer Paternal Grandfather   . Kidney cancer Maternal Aunt   . Heart disease Father   . Heart disease Maternal Grandfather     SOCIAL HISTORY: Social History  Social History  . Marital Status: Single    Spouse Name: N/A  . Number of Children: N/A  . 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 Smoking status: Never Smoker   . Smokeless tobacco: Never Used  . Alcohol Use: No     Comment: OCC.  . Drug Use: No  . Sexual Activity: Not on file   Other Topics Concern  . Not on file   Social History Narrative    REVIEW OF  SYSTEMS: Constitutional: No fevers, chills, or sweats, no generalized fatigue, change in appetite Eyes: No visual changes, double vision, eye pain Ear, nose and throat: No hearing loss, ear pain, nasal congestion, sore throat Cardiovascular: No chest pain, palpitations Respiratory:  No shortness of breath at rest or with exertion, wheezes GastrointestinaI: No nausea, vomiting, diarrhea, abdominal pain, fecal incontinence Genitourinary:  No dysuria, urinary retention or frequency Musculoskeletal:  No neck pain, back pain Integumentary: No rash, pruritus, skin lesions Neurological: as above Psychiatric: No depression, insomnia, anxiety Endocrine: No palpitations, fatigue, diaphoresis, mood swings, change in appetite, change in weight, increased thirst Hematologic/Lymphatic:  No anemia, purpura, petechiae. Allergic/Immunologic: no itchy/runny eyes, nasal congestion, recent allergic reactions, rashes  PHYSICAL EXAM: Filed Vitals:   03/17/15 0927  BP: 104/66  Pulse: 81   General: No acute distress.  Patient appears well-groomed.  normal body habitus. Head:  Normocephalic/atraumatic Eyes:  Fundoscopic exam unremarkable without vessel changes, exudates, hemorrhages or papilledema. Neck: supple, no paraspinal tenderness, full range of motion Heart:  Regular rate and rhythm Lungs:  Clear to auscultation bilaterally Back: No paraspinal tenderness Neurological Exam: alert and oriented to person, place, and time. Attention span and concentration intact, recent and remote memory intact, fund of knowledge intact.  Speech fluent and not dysarthric, language intact.  CN II-XII intact. Fundoscopic exam unremarkable without vessel changes, exudates, hemorrhages or papilledema.  Bulk and tone normal, muscle strength 5/5 throughout.  Sensation to light touch, temperature and vibration intact.  Deep tendon reflexes 2+ throughout, toes downgoing.  Finger to nose and heel to shin testing intact.  Gait normal,  Romberg negative.  IMPRESSION: Migraine with aura anxiety  PLAN: 1.  For abortive therapy, will stop Maxalt and try Zomig 5mg  NS 2.  Headaches are infrequent and she already had a reaction to a preventative.  We will try to treat with vitamins and supplements first:  Magnesium citrate 400mg  daily, riboflavin 400mg  daily and coenzyme Q10 100mg  three times daily 3.  If Zomig ineffective, we can try Debara Pickett. 4.  If headaches become more frequent, will try switching from Lexapro to venlafaxine ER, which also treats anxiety. 5.  Follow up in 3 to 4 months.  15 minutes spent face to face with patient, over 50% spent discussing management.  Metta Clines, DO  CC: Cathlean Cower, MD

## 2015-03-17 NOTE — Patient Instructions (Signed)
Migraine Recommendations: 1.  Start taking the following supplements:  Magnesium citrate 400mg  daily, riboflavin 400mg  daily and coenzyme Q10 100mg  three times daily. 2.  Take 1 spray of Zomig 5mg  in one nostril at earliest onset of headache.  May repeat dose once in 2 hours if needed.  Do not exceed two sprays in 24 hours. 3.  Limit use of pain relievers to no more than 2 days out of the week.  These medications include acetaminophen, ibuprofen, triptans and narcotics.  This will help reduce risk of rebound headaches. 4.  Be aware of common food triggers such as processed sweets, processed foods with nitrites (such as deli meat, hot dogs, sausages), foods with MSG, alcohol (such as wine), chocolate, certain cheeses, certain fruits (dried fruits, some citrus fruit), vinegar, diet soda. 4.  Avoid caffeine 5.  Routine exercise 6.  Proper sleep hygiene 7.  Stay adequately hydrated with water 8.  Keep a headache diary. 9.  Maintain proper stress management. 10.  Do not skip meals. 11.  Follow up in 3 to 4 months 12.  Call if the medication doesn't work or if you feel we need to start a new daily prescription medication such as venlafaxine ER (Effexor)

## 2015-03-29 ENCOUNTER — Ambulatory Visit: Payer: Self-pay | Admitting: Internal Medicine

## 2015-07-12 ENCOUNTER — Other Ambulatory Visit: Payer: Self-pay | Admitting: *Deleted

## 2015-07-12 MED ORDER — ESCITALOPRAM OXALATE 10 MG PO TABS: ORAL_TABLET | ORAL | Status: DC

## 2015-07-14 MED ORDER — ESCITALOPRAM OXALATE 10 MG PO TABS: ORAL_TABLET | ORAL | Status: DC

## 2015-07-14 NOTE — Addendum Note (Signed)
Addended by: Earnstine Regal on: 07/14/2015 08:25 AM   Modules accepted: Orders

## 2015-07-20 ENCOUNTER — Ambulatory Visit: Payer: Self-pay | Admitting: Neurology

## 2015-07-20 DIAGNOSIS — Z029 Encounter for administrative examinations, unspecified: Secondary | ICD-10-CM

## 2015-08-12 ENCOUNTER — Telehealth: Payer: Self-pay

## 2015-08-12 MED ORDER — ESCITALOPRAM OXALATE 10 MG PO TABS: 15.0000 mg | ORAL_TABLET | Freq: Every day | ORAL | Status: DC

## 2015-08-12 NOTE — Telephone Encounter (Signed)
Pt called lm on triage. rq rf for lexapro. Not able to refill bc annual is due. Pt contacted and informed of the same. Pt schedule CPE for Aug 29 at 1pm.   erx sent in for 90 (2 month supply) and no refills.

## 2015-10-04 ENCOUNTER — Other Ambulatory Visit (INDEPENDENT_AMBULATORY_CARE_PROVIDER_SITE_OTHER): Payer: BLUE CROSS/BLUE SHIELD

## 2015-10-04 ENCOUNTER — Ambulatory Visit (INDEPENDENT_AMBULATORY_CARE_PROVIDER_SITE_OTHER): Payer: BLUE CROSS/BLUE SHIELD | Admitting: Internal Medicine

## 2015-10-04 ENCOUNTER — Encounter: Payer: Self-pay | Admitting: Internal Medicine

## 2015-10-04 ENCOUNTER — Telehealth: Payer: Self-pay | Admitting: Internal Medicine

## 2015-10-04 VITALS — BP 118/72 | HR 71 | Temp 99.1°F | Resp 20 | Wt 123.0 lb

## 2015-10-04 DIAGNOSIS — R6889 Other general symptoms and signs: Secondary | ICD-10-CM

## 2015-10-04 DIAGNOSIS — J019 Acute sinusitis, unspecified: Secondary | ICD-10-CM

## 2015-10-04 DIAGNOSIS — Z0001 Encounter for general adult medical examination with abnormal findings: Secondary | ICD-10-CM | POA: Diagnosis not present

## 2015-10-04 DIAGNOSIS — M2550 Pain in unspecified joint: Secondary | ICD-10-CM | POA: Diagnosis not present

## 2015-10-04 DIAGNOSIS — K589 Irritable bowel syndrome without diarrhea: Secondary | ICD-10-CM

## 2015-10-04 DIAGNOSIS — D126 Benign neoplasm of colon, unspecified: Secondary | ICD-10-CM | POA: Insufficient documentation

## 2015-10-04 HISTORY — DX: Irritable bowel syndrome, unspecified: K58.9

## 2015-10-04 HISTORY — DX: Benign neoplasm of colon, unspecified: D12.6

## 2015-10-04 LAB — HEPATIC FUNCTION PANEL
ALBUMIN: 4.2 g/dL (ref 3.5–5.2)
ALK PHOS: 54 U/L (ref 39–117)
ALT: 13 U/L (ref 0–35)
AST: 18 U/L (ref 0–37)
BILIRUBIN TOTAL: 1.8 mg/dL — AB (ref 0.2–1.2)
Bilirubin, Direct: 0.3 mg/dL (ref 0.0–0.3)
Total Protein: 7.3 g/dL (ref 6.0–8.3)

## 2015-10-04 LAB — URINALYSIS, ROUTINE W REFLEX MICROSCOPIC
Bilirubin Urine: NEGATIVE
Ketones, ur: NEGATIVE
Leukocytes, UA: NEGATIVE
Nitrite: NEGATIVE
Specific Gravity, Urine: <=1.005 — AB (ref 1.000–1.030)
Total Protein, Urine: NEGATIVE
Urine Glucose: NEGATIVE
Urobilinogen, UA: 0.2 (ref 0.0–1.0)
pH: 6.5 (ref 5.0–8.0)

## 2015-10-04 LAB — CBC WITH DIFFERENTIAL/PLATELET
Basophils Absolute: 0 10*3/uL (ref 0.0–0.1)
Basophils Relative: 0.7 % (ref 0.0–3.0)
Eosinophils Absolute: 0.1 10*3/uL (ref 0.0–0.7)
Eosinophils Relative: 2.2 % (ref 0.0–5.0)
HCT: 41.5 % (ref 36.0–46.0)
Hemoglobin: 14.3 g/dL (ref 12.0–15.0)
Lymphocytes Relative: 35.5 % (ref 12.0–46.0)
Lymphs Abs: 2.4 10*3/uL (ref 0.7–4.0)
MCHC: 34.4 g/dL (ref 30.0–36.0)
MCV: 90.6 fl (ref 78.0–100.0)
Monocytes Absolute: 0.6 10*3/uL (ref 0.1–1.0)
Monocytes Relative: 9.1 % (ref 3.0–12.0)
Neutro Abs: 3.6 10*3/uL (ref 1.4–7.7)
Neutrophils Relative %: 52.5 % (ref 43.0–77.0)
Platelets: 296 10*3/uL (ref 150.0–400.0)
RBC: 4.58 Mil/uL (ref 3.87–5.11)
RDW: 12.8 % (ref 11.5–15.5)
WBC: 6.9 10*3/uL (ref 4.0–10.5)

## 2015-10-04 LAB — LIPID PANEL
CHOL/HDL RATIO: 3
Cholesterol: 175 mg/dL (ref 0–200)
HDL: 64.5 mg/dL (ref >39.00)
LDL CALC: 96 mg/dL (ref 0–99)
NonHDL: 110.2
TRIGLYCERIDES: 72 mg/dL (ref 0.0–149.0)
VLDL: 14.4 mg/dL (ref 0.0–40.0)

## 2015-10-04 LAB — BASIC METABOLIC PANEL
BUN: 11 mg/dL (ref 6–23)
CHLORIDE: 103 meq/L (ref 96–112)
CO2: 30 mEq/L (ref 19–32)
Calcium: 9.3 mg/dL (ref 8.4–10.5)
Creatinine, Ser: 0.74 mg/dL (ref 0.40–1.20)
GFR: 103.87 mL/min (ref >60.00)
GLUCOSE: 71 mg/dL (ref 70–99)
POTASSIUM: 3.7 meq/L (ref 3.5–5.1)
SODIUM: 138 meq/L (ref 135–145)

## 2015-10-04 LAB — TSH: TSH: 1.37 u[IU]/mL (ref 0.35–4.50)

## 2015-10-04 MED ORDER — CEPHALEXIN 500 MG PO CAPS: 500.0000 mg | ORAL_CAPSULE | Freq: Three times a day (TID) | ORAL | 0 refills | Status: AC

## 2015-10-04 MED ORDER — ESCITALOPRAM OXALATE 10 MG PO TABS: 15.0000 mg | ORAL_TABLET | Freq: Every day | ORAL | 3 refills | Status: DC

## 2015-10-04 MED ORDER — AZITHROMYCIN 250 MG PO TABS: ORAL_TABLET | ORAL | 1 refills | Status: DC

## 2015-10-04 NOTE — Assessment & Plan Note (Signed)

## 2015-10-04 NOTE — Progress Notes (Signed)
Pre visit review using our clinic review tool, if applicable. No additional management support is needed unless otherwise documented below in the visit note. 

## 2015-10-04 NOTE — Patient Instructions (Addendum)
Please take all new medication as prescribed - the antibiotic  You can also take Delsym OTC for cough, and/or Mucinex (or it's generic off brand) for congestion, and tylenol as needed for pain.  Please continue all other medications as before, and refills have been done if requested.  Please have the pharmacy call with any other refills you may need.  Please continue your efforts at being more active, low cholesterol diet, and weight control.  You are otherwise up to date with prevention measures today.  Please keep your appointments with your specialists as you may have planned  Please go to the LAB in the Basement (turn left off the elevator) for the tests to be done today  You will be contacted by phone if any changes need to be made immediately.  Otherwise, you will receive a letter about your results with an explanation, but please check with MyChart first.  Please remember to sign up for MyChart if you have not done so, as this will be important to you in the future with finding out test results, communicating by private email, and scheduling acute appointments online when needed.  Please return in 1 year for your yearly visit, or sooner if needed, with Lab testing done 3-5 days before

## 2015-10-04 NOTE — Telephone Encounter (Signed)
Ok for cephalexin asd, .done erx

## 2015-10-04 NOTE — Assessment & Plan Note (Signed)
For f/u colonoscopy 2021 (5 yrs)

## 2015-10-04 NOTE — Progress Notes (Signed)
Subjective:    Patient ID: Mary Cochran, female    DOB: 12-06-1995, 20 y.o.   MRN: [id number]  HPI  Here for wellness and f/u;  Overall doing ok;  Pt denies Chest pain, worsening SOB, DOE, wheezing, orthopnea, PND, worsening LE edema, palpitations, dizziness or syncope.  Pt denies neurological change such as new headache, facial or extremity weakness.  Pt denies polydipsia, polyuria, or low sugar symptoms. Pt states overall good compliance with treatment and medications, good tolerability, and has been trying to follow appropriate diet.  Pt denies worsening depressive symptoms, suicidal ideation or panic. No fever, night sweats, wt loss, loss of appetite, or other constitutional symptoms.  Pt states good ability with ADL's, has low fall risk, home safety reviewed and adequate, no other significant changes in hearing or vision, and occasionally active with exercise with going to gym 3 times per wk Wt Readings from Last 3 Encounters:  10/04/15 123 lb (55.8 kg)  03/17/15 117 lb (53.1 kg)  12/01/14 118 lb 6 oz (53.7 kg)  Declines immunizations for today.   Here with 2-3 days acute onset fever, facial pain, pressure, headache, general weakness and malaise, and greenish d/c, with mild ST and cough, but pt denies chest pain, wheezing, increased sob or doe, orthopnea, PND, increased LE swelling, palpitations, dizziness or syncope.  Also c/o bilat shoulder and right hip pain, goes to gym 3 times per wk and doesn't overdo it, wt stable, but aching persists to all joints, and can be severe intermittent especially to right lateral hip area,  Better with slowing down and self massage, but worse to start up exercise again.    Past Medical History:  Diagnosis Date  . Adenomatous polyp of colon 10/04/2015  . ALLERGIC RHINITIS 12/12/2006  . Allergy    SEASONAL  . Anxiety   . Depression 10/03/2011  . GILBERT'S SYNDROME 03/07/2010  . IBS (irritable bowel syndrome) 10/04/2015  . INSOMNIA-SLEEP  DISORDER-UNSPEC 04/18/2007  . Tubular adenoma of colon 10/2014   Past Surgical History:  Procedure Laterality Date  . MOUTH SURGERY    . TONSILLECTOMY      reports that she has never smoked. She has never used smokeless tobacco. She reports that she does not drink alcohol or use drugs. family history includes Breast cancer in her maternal grandmother; Heart disease in her father, maternal grandfather, and maternal grandmother; Kidney cancer in her maternal aunt; Lung cancer in her paternal grandfather. Allergies  Allergen Reactions  . Azithromycin   . Hydrocodone     REACTION: GI upset  . Morphine    Current Outpatient Prescriptions on File Prior to Visit  Medication Sig Dispense Refill  . escitalopram (LEXAPRO) 10 MG tablet Take 1.5 tablets (15 mg total) by mouth daily. 90 tablet 0  . ibuprofen (ADVIL,MOTRIN) 200 MG tablet Take 200 mg by mouth every 6 (six) hours as needed.     No current facility-administered medications on file prior to visit.     Review of Systems Constitutional: Negative for increased diaphoresis, or other activity, appetite or siginficant weight change other than noted HENT: Negative for worsening hearing loss, ear pain, facial swelling, mouth sores and neck stiffness.   Eyes: Negative for other worsening pain, redness or visual disturbance.  Respiratory: Negative for choking or stridor Cardiovascular: Negative for other chest pain and palpitations.  Gastrointestinal: Negative for worsening diarrhea, blood in stool, or abdominal distention Genitourinary: Negative for hematuria, flank pain or change in urine volume.  Musculoskeletal: Negative for myalgias  or other joint complaints.  Skin: Negative for other color change and wound or drainage.  Neurological: Negative for syncope and numbness. other than noted Hematological: Negative for adenopathy. or other swelling Psychiatric/Behavioral: Negative for hallucinations, SI, self-injury, decreased concentration or  other worsening agitation.      Objective:   Physical Exam BP 118/72   Pulse 71   Temp 99.1 F (37.3 C) (Oral)   Resp 20   Wt 123 lb (55.8 kg)   SpO2 96%   BMI 19.26 kg/m  VS noted, mild ill Constitutional: Pt is oriented to person, place, and time. Appears well-developed and well-nourished, in no significant distress Head: Normocephalic and atraumatic  Eyes: Conjunctivae and EOM are normal. Pupils are equal, round, and reactive to light Right Ear: External ear normal.  Left Ear: External ear normal Nose: Nose normal.  Bilat tm's with mild erythema.  Max sinus areas mild tender.  Pharynx with mild erythema, no exudate Mouth/Throat: Oropharynx is clear and moist  Neck: Normal range of motion. Neck supple. No JVD present. No tracheal deviation present or significant neck LA or mass Cardiovascular: Normal rate, regular rhythm, normal heart sounds and intact distal pulses.   Pulmonary/Chest: Effort normal and breath sounds without rales or wheezing  Abdominal: Soft. Bowel sounds are normal. NT. No HSM  Musculoskeletal: Normal range of motion. Exhibits no edema Lymphadenopathy: Has no cervical adenopathy.  Neurological: Pt is alert and oriented to person, place, and time. Pt has normal reflexes. No cranial nerve deficit. Motor grossly intact Skin: Skin is warm and dry. No rash noted or new ulcers Psychiatric:  Has normal mood and affect. Behavior is normal.     Assessment & Plan:

## 2015-10-04 NOTE — Telephone Encounter (Signed)
Pt called stating she is allergic to azithromycin (ZITHROMAX Z-PAK) 250 MG tablet . Please advise, pt actually pick up rx we sent in but she hasnt take it yet.

## 2015-10-04 NOTE — Assessment & Plan Note (Addendum)
Mild to mod, for antibx course,  to f/u any worsening symptoms or concerns  In addition to the time spent performing CPE, I spent an additional 15 minutes face to face,in which greater than 50% of this time was spent in counseling and coordination of care for patient's illness as documented.   

## 2015-10-04 NOTE — Telephone Encounter (Signed)
Pt ware

## 2015-10-04 NOTE — Assessment & Plan Note (Signed)
Etiology unclear, pt plans to make appt with Dr Tamala Julian [facility] in this office

## 2015-10-25 NOTE — Progress Notes (Signed)
[facility] [place],  [id number] Phone: [phone] Subjective:    I'm seeing this patient by the request  of:  Cathlean Cower, MD  CC: Childers neck and back pain  [id number]  Mary Cochran is a 20 y.o. female coming in with complaint of shoulder neck and back pain. Patient is had this pain for quite some time. Discuss it as a dull, throbbing aching sensation. Patient knows that sometimes stress seems to be plantar role. Seems to be between the shoulder blades as well as the lower right side of her back. Patient denies any radiation to any extent knees. Just describes it as a dull aching pain that does not seem to go away. Patient rates the severity of pain as 5 out of 10. Still works out on a regular basis. Does not interfere with daily activities. Does seem to give her some irritation at night.     Past Medical History:  Diagnosis Date  . Adenomatous polyp of colon 10/04/2015  . ALLERGIC RHINITIS 12/12/2006  . Allergy    SEASONAL  . Anxiety   . Depression 10/03/2011  . GILBERT'S SYNDROME 03/07/2010  . IBS (irritable bowel syndrome) 10/04/2015  . INSOMNIA-SLEEP DISORDER-UNSPEC 04/18/2007  . Tubular adenoma of colon 10/2014   Past Surgical History:  Procedure Laterality Date  . MOUTH SURGERY    . TONSILLECTOMY     Social History   Social History  . Marital status: Single    Spouse name: N/A  . Number of children: N/A  . 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 Smoking status: Never Smoker  . Smokeless tobacco: Never Used  . Alcohol use No     Comment: OCC.  . Drug use: No  . Sexual activity: Not Asked   Other Topics Concern  . None   Social History Narrative  . None   Allergies  Allergen Reactions  . Azithromycin   . Hydrocodone     REACTION: GI upset  . Morphine    Family History  Problem Relation Age of Onset  . Breast cancer Maternal  Grandmother   . Heart disease Maternal Grandmother   . Pancreatic cancer      M . great grandfather  . Lung cancer Paternal Grandfather   . Kidney cancer Maternal Aunt   . Heart disease Father   . Heart disease Maternal Grandfather     Past medical history, social, surgical and family history all reviewed in electronic medical record.  No pertanent information unless stated regarding to the chief complaint.   Review of Systems: No headache, visual changes, nausea, vomiting, diarrhea, constipation, dizziness, abdominal pain, skin rash, fevers, chills, night sweats, weight loss, swollen lymph nodes, body aches, joint swelling, muscle aches, chest pain, shortness of breath, mood changes.   Objective  Blood pressure 94/64, height 5\' 6"  (1.676 m), weight 121 lb (54.9 kg).  General: No apparent distress alert and oriented x3 mood and affect normal, dressed appropriately.  HEENT: Pupils equal, extraocular movements intact  Respiratory: Patient's speak in full sentences and does not appear short of breath  Cardiovascular: No lower extremity edema, non tender, no erythema  Skin: Warm dry intact with no signs of infection or rash on extremities or on axial skeleton.  Abdomen: Soft nontender  Neuro: Cranial nerves II through XII are intact, neurovascularly intact in all extremities with 2+ DTRs and 2+ pulses.  Lymph: No lymphadenopathy of posterior or anterior cervical chain or axillae bilaterally.  Gait normal with good balance and coordination.  MSK:  Non tender with full range of motion and good stability and symmetric strength and tone of shoulders, elbows, wrist, hip, knee and ankles bilaterally.  Neck: Inspection unremarkable. No palpable stepoffs. Negative Spurling's maneuver. Full neck range of motion Grip strength and sensation normal in bilateral hands Strength good C4 to T1 distribution No sensory change to C4 to T1 Negative Hoffman sign bilaterally Reflexes normal Mild scapula  dyskensia.  Back Exam:  Inspection: Unremarkable  Motion: Flexion 45 deg, Extension 25 deg, Side Bending to 45 deg bilaterally,  Rotation to 45 deg bilaterally  SLR laying: Negative  XSLR laying: Negative  Palpable tenderness: TTP right SI joint. Marland Kitchen FABER: Positive right . Sensory change: Gross sensation intact to all lumbar and sacral dermatomes.  Reflexes: 2+ at both patellar tendons, 2+ at achilles tendons, Babinski's downgoing.  Strength at foot  Plantar-flexion: 5/5 Dorsi-flexion: 5/5 Eversion: 5/5 Inversion: 5/5  Leg strength  Quad: 5/5 Hamstring: 5/5 Hip flexor: 5/5 Hip abductors: 3+/5  Gait unremarkable.  Osteopathic findings.  C2 Flexed rotated and side bent  T3 Extended rotated and side bent right T7 Extended rotated and side bent left  L3 Flexed rotated and side bent left  Sacrum right on right.   Procedure note [id number]; 15 minutes spent for Therapeutic exercises as stated in above notes.  This included exercises focusing on stretching, strengthening, with significant focus on eccentric aspects.  Sacroiliac Joint Mobilization and Rehab 1. Work on pretzel stretching, shoulder back and leg draped in front. 3-5 sets, 30 sec.. 2. hip abductor rotations. standing, hip flexion and rotation outward then inward. 3 sets, 15 reps. when can do comfortably, add ankle weights starting at 2 pounds.  3. cross over stretching - shoulder back to ground, same side leg crossover. 3-5 sets for 30 min..  4. rolling up and back knees to chest and rocking. 5. sacral tilt - 5 sets, hold for 5-10 seconds  Proper technique shown and discussed handout in great detail with ATC.  All questions were discussed and answered.     Impression and Recommendations:     This case required medical decision making of moderate complexity.      Note: This dictation was prepared with Dragon dictation along with smaller phrase technology. Any transcriptional errors that result from this process are  unintentional.

## 2015-10-26 ENCOUNTER — Encounter: Payer: Self-pay | Admitting: Family Medicine

## 2015-10-26 ENCOUNTER — Ambulatory Visit (INDEPENDENT_AMBULATORY_CARE_PROVIDER_SITE_OTHER): Payer: BLUE CROSS/BLUE SHIELD | Admitting: Family Medicine

## 2015-10-26 DIAGNOSIS — M9904 Segmental and somatic dysfunction of sacral region: Secondary | ICD-10-CM | POA: Diagnosis not present

## 2015-10-26 DIAGNOSIS — M9903 Segmental and somatic dysfunction of lumbar region: Secondary | ICD-10-CM

## 2015-10-26 DIAGNOSIS — M9902 Segmental and somatic dysfunction of thoracic region: Secondary | ICD-10-CM | POA: Diagnosis not present

## 2015-10-26 DIAGNOSIS — M999 Biomechanical lesion, unspecified: Secondary | ICD-10-CM

## 2015-10-26 DIAGNOSIS — M899 Disorder of bone, unspecified: Secondary | ICD-10-CM | POA: Insufficient documentation

## 2015-10-26 DIAGNOSIS — M533 Sacrococcygeal disorders, not elsewhere classified: Secondary | ICD-10-CM | POA: Diagnosis not present

## 2015-10-26 HISTORY — DX: Biomechanical lesion, unspecified: M99.9

## 2015-10-26 NOTE — Assessment & Plan Note (Signed)
Decision today to treat with OMT was based on Physical Exam  After verbal consent patient was treated with HVLA, ME techniques in cervical, thoracic, lumbar areas  Patient tolerated the procedure well with improvement in symptoms  Patient given exercises, stretches and lifestyle modifications  See medications in patient instructions if given  Patient will follow up in 3-4 weeks    

## 2015-10-26 NOTE — Patient Instructions (Addendum)
God to see you  Ice 20 minutes 2 times daily. Usually after activity and before bed. Exercises 3 times a week.  Exercises on wall.  Heel and butt touching.  Raise leg 6 inches and hold 2 seconds.  Down slow for count of 4 seconds.  1 set of 30 reps daily on both sides.  On wall with heels, butt shoulder and head touching for a goal of 5 minutes daily  If in pain pennsaid pinkie amount topically 2 times daily as needed.   Vitamin D 2000 IU daily will help as well with muscle strength and endurance See me again in 3 weeks.

## 2015-10-26 NOTE — Assessment & Plan Note (Signed)
Sacroiliac Joint Mobilization and Rehab 1. Work on pretzel stretching, shoulder back and leg draped in front. 3-5 sets, 30 sec.. 2. hip abductor rotations. standing, hip flexion and rotation outward then inward. 3 sets, 15 reps. when can do comfortably, add ankle weights starting at 2 pounds.  3. cross over stretching - shoulder back to ground, same side leg crossover. 3-5 sets for 30 min..  4. rolling up and back knees to chest and rocking. 5. sacral tilt - 5 sets, hold for 5-10 seconds Discussed hip abductor strength, shoes, given trial of topical anti-inflammatories.

## 2015-10-26 NOTE — Assessment & Plan Note (Signed)
Does have some scapular dysfunction. Work with Product/process development scientist today. We discussed strengthening and posture. Patient come back and see me again in 4-6 weeks.

## 2015-11-15 ENCOUNTER — Encounter: Payer: Self-pay | Admitting: Family Medicine

## 2015-11-15 ENCOUNTER — Ambulatory Visit (INDEPENDENT_AMBULATORY_CARE_PROVIDER_SITE_OTHER): Payer: BLUE CROSS/BLUE SHIELD | Admitting: Family Medicine

## 2015-11-15 VITALS — BP 122/70 | HR 84 | Wt 122.0 lb

## 2015-11-15 DIAGNOSIS — M999 Biomechanical lesion, unspecified: Secondary | ICD-10-CM

## 2015-11-15 DIAGNOSIS — M533 Sacrococcygeal disorders, not elsewhere classified: Secondary | ICD-10-CM

## 2015-11-15 DIAGNOSIS — M899 Disorder of bone, unspecified: Secondary | ICD-10-CM | POA: Diagnosis not present

## 2015-11-15 MED ORDER — DICLOFENAC SODIUM 2 % TD SOLN: TRANSDERMAL | 3 refills | Status: DC

## 2015-11-15 NOTE — Patient Instructions (Signed)
Great to see you and congrats I know it is frustrating but we will get you there pennsaid pinkie amount topically 2 times daily as needed.  Ice regularly after activity and before bed.  Avoid heavy lifting for now See me again in 4 weeks for another round.

## 2015-11-15 NOTE — Progress Notes (Signed)
[facility] [address] [place], [place] [address] Phone: [phone] Subjective:    CC: Childers neck and back pain f/u  [id number]  Mary Cochran is a 20 y.o. female coming in with complaint of Shoulder pain and bilateral hip pain. Has been going on for quite some time. Has responded fairly well to osteopathic manipulation. Patient continues to have unfortunately the pain. Taking the over-the-counter medications. States that it is a still a dull throbbing aching pain and seems to be most of the time. Has not started doing the exercises regularly but when she does it does seem to help. No new symptoms just worsening of previous symptoms.     Past Medical History:  Diagnosis Date  . Adenomatous polyp of colon 10/04/2015  . ALLERGIC RHINITIS 12/12/2006  . Allergy    SEASONAL  . Anxiety   . Depression 10/03/2011  . GILBERT'S SYNDROME 03/07/2010  . IBS (irritable bowel syndrome) 10/04/2015  . INSOMNIA-SLEEP DISORDER-UNSPEC 04/18/2007  . Tubular adenoma of colon 10/2014   Past Surgical History:  Procedure Laterality Date  . MOUTH SURGERY    . TONSILLECTOMY     Social History   Social History  . Marital status: Single    Spouse name: N/A  . Number of children: N/A  . 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 Smoking status: Never Smoker  . Smokeless tobacco: Never Used  . Alcohol use No     Comment: OCC.  . Drug use: No  . Sexual activity: Not Asked   Other Topics Concern  . None   Social History Narrative  . None   Allergies  Allergen Reactions  . Azithromycin   . Hydrocodone     REACTION: GI upset  . Morphine    Family History  Problem Relation Age of Onset  . Breast cancer Maternal Grandmother   . Heart disease Maternal Grandmother   . Pancreatic cancer      M . great grandfather  . Lung cancer Paternal Grandfather   . Kidney cancer Maternal Aunt   . Heart  disease Father   . Heart disease Maternal Grandfather     Past medical history, social, surgical and family history all reviewed in electronic medical record.  No pertanent information unless stated regarding to the chief complaint.   Review of Systems: No headache, visual changes, nausea, vomiting, diarrhea, constipation, dizziness, abdominal pain, skin rash, fevers, chills, night sweats, weight loss, swollen lymph nodes, body aches, joint swelling, muscle aches, chest pain, shortness of breath, mood changes.   Objective  Blood pressure 122/70, pulse 84, weight 122 lb (55.3 kg), SpO2 97 %.  General: No apparent distress alert and oriented x3 mood and affect normal, dressed appropriately.  HEENT: Pupils equal, extraocular movements intact  Respiratory: Patient's speak in full sentences and does not appear short of breath  Cardiovascular: No lower extremity edema, non tender, no erythema  Skin: Warm dry intact with no signs of infection or rash on extremities or on axial skeleton.  Abdomen: Soft nontender  Neuro: Cranial nerves II through XII are intact, neurovascularly intact in all extremities with 2+ DTRs and 2+ pulses.  Lymph: No lymphadenopathy of posterior or anterior cervical chain or axillae bilaterally.  Gait normal with good balance and coordination.  MSK:  Non tender with full range of motion and good stability and symmetric strength and tone of shoulders, elbows, wrist,  hip, knee and ankles bilaterally.  Neck: Inspection unremarkable. No palpable stepoffs. Negative Spurling's maneuver. Full neck range of motion Grip strength and sensation normal in bilateral hands Strength good C4 to T1 distribution No sensory change to C4 to T1 Negative Hoffman sign bilaterally Reflexes normal   Back Exam:  Inspection: Unremarkable  Motion: Flexion 45 deg, Extension 25 deg Mild worsening pain, Side Bending to 35 deg bilaterally,  Rotation to 35 deg bilaterally  SLR laying: Negative    XSLR laying: Negative  Palpable tenderness: Pain over the sacroiliac joints now bilaterally FABER: Positive right . Sensory change: Gross sensation intact to all lumbar and sacral dermatomes.  Reflexes: 2+ at both patellar tendons, 2+ at achilles tendons, Babinski's downgoing.  Strength at foot  Plantar-flexion: 5/5 Dorsi-flexion: 5/5 Eversion: 5/5 Inversion: 5/5  Leg strength  Quad: 5/5 Hamstring: 5/5 Hip flexor: 5/5 Hip abductors: 4+/5  Gait unremarkable.  Osteopathic findings.  C2 Flexed rotated and side bent  T3 Extended rotated and side bent right T9 Extended rotated and side bent left  L3 Flexed rotated and side bent left  Sacrum right on right.     Impression and Recommendations:     This case required medical decision making of moderate complexity.      Note: This dictation was prepared with Dragon dictation along with smaller phrase technology. Any transcriptional errors that result from this process are unintentional.

## 2015-11-15 NOTE — Assessment & Plan Note (Signed)
Posture is still key. Discussed ergonomics. Follow-up 4-6 weeks

## 2015-11-15 NOTE — Assessment & Plan Note (Signed)
Patient continues to have pain overall. We discussed icing regimen, we discussed topical anti-inflammatories which was sent in today. We discussed which activities doing which ones to avoid. Patient will start working on hip abductor strengthening again. Needs to work on posture. Follow-up again in 4 weeks.

## 2015-11-15 NOTE — Assessment & Plan Note (Signed)
Decision today to treat with OMT was based on Physical Exam  After verbal consent patient was treated with HVLA, ME techniques in cervical, thoracic, lumbar  areas  Patient tolerated the procedure well with improvement in symptoms  Patient given exercises, stretches and lifestyle modifications  See medications in patient instructions if given  Patient will follow up in 4 weeks  

## 2015-11-23 ENCOUNTER — Encounter (HOSPITAL_BASED_OUTPATIENT_CLINIC_OR_DEPARTMENT_OTHER): Payer: Self-pay | Admitting: *Deleted

## 2015-11-23 ENCOUNTER — Emergency Department (HOSPITAL_BASED_OUTPATIENT_CLINIC_OR_DEPARTMENT_OTHER)
Admission: EM | Admit: 2015-11-23 | Discharge: 2015-11-23 | Disposition: A | Discharge status: Home / Self Care | Payer: BLUE CROSS/BLUE SHIELD | Attending: Emergency Medicine | Admitting: Emergency Medicine

## 2015-11-23 DIAGNOSIS — M5416 Radiculopathy, lumbar region: Secondary | ICD-10-CM | POA: Diagnosis not present

## 2015-11-23 DIAGNOSIS — M545 Low back pain: Secondary | ICD-10-CM | POA: Diagnosis present

## 2015-11-23 LAB — PREGNANCY, URINE: PREG TEST UR: NEGATIVE

## 2015-11-23 MED ORDER — KETOROLAC TROMETHAMINE 10 MG PO TABS: 10.0000 mg | ORAL_TABLET | Freq: Four times a day (QID) | ORAL | 0 refills | Status: DC | PRN

## 2015-11-23 MED ORDER — KETOROLAC TROMETHAMINE 60 MG/2ML IM SOLN
30.0000 mg | Freq: Once | INTRAMUSCULAR | Status: AC
  Administered 2015-11-23: 30 mg via INTRAMUSCULAR
  Filled 2015-11-23: qty 2

## 2015-11-23 NOTE — ED Provider Notes (Signed)
[facility] Provider Note   CSN: [id number] Arrival date & time: 11/23/15  1955  By signing my name below, I, Neta Mends, attest that this documentation has been prepared under the direction and in the presence of [employer], PA-C. Electronically Signed: Neta Mends, ED Scribe. 11/23/2015. 8:44 PM.   History   Chief Complaint Chief Complaint  Patient presents with  . Back Pain    The history is provided by the patient. No language interpreter was used.   HPI Comments:  Mary Cochran is a 20 y.o. female who presents to the Emergency Department complaining of constant back pain due to an injury 1 day ago. Pt states that she lifted up a chair which caused her to have sudden right-sided back pain and then she fell into the wall. Pt states that the pain radiates down her right leg. Pt states that the pain is exacerbated when breathing, walking, and that she is unable to lay flat on her back. Pt states that she has seen her sports medicine doctor Hulan Saas, DO for intermittent hip pain 2 times over the past month. No alleviating factors noted. Pt denies any trauma, IV drug use, PMHx of cancer. Pt denies other associated symptoms.   Past Medical History:  Diagnosis Date  . Adenomatous polyp of colon 10/04/2015  . ALLERGIC RHINITIS 12/12/2006  . Allergy    SEASONAL  . Anxiety   . Depression 10/03/2011  . GILBERT'S SYNDROME 03/07/2010  . IBS (irritable bowel syndrome) 10/04/2015  . INSOMNIA-SLEEP DISORDER-UNSPEC 04/18/2007  . Tubular adenoma of colon 10/2014    Patient Active Problem List   Diagnosis Date Noted  . SI (sacroiliac) joint dysfunction 10/26/2015  . Nonallopathic lesion of sacral region 10/26/2015  . Nonallopathic lesion of thoracic region 10/26/2015  . Nonallopathic lesion of lumbosacral region 10/26/2015  . Scapular dysfunction 10/26/2015  . IBS (irritable bowel syndrome) 10/04/2015  . Adenomatous polyp of colon 10/04/2015  .  Polyarthralgia 10/04/2015  . Migraine 06/17/2014  . Abdominal pain 06/17/2014  . Diarrhea 06/17/2014  . Non-compliant behavior 05/18/2014  . Acute upper respiratory infections of unspecified site 10/30/2013  . Eczema 10/30/2013  . Mouth ulcer 10/30/2013  . Eczema of both hands 09/30/2013  . Skin lesion 10/16/2012  . Vertigo 04/06/2012  . Migraine with aura 04/06/2012  . Anxiety 10/30/2011  . Alternating constipation and diarrhea 10/03/2011  . Depression 10/03/2011  . Encounter for well adult exam with abnormal findings 04/10/2011  . GILBERT'S SYNDROME 03/07/2010  . SYNCOPE 10/07/2008  . Acute sinus infection 10/23/2007  . INSOMNIA-SLEEP DISORDER-UNSPEC 04/18/2007  . ALLERGIC RHINITIS 12/12/2006    Past Surgical History:  Procedure Laterality Date  . MOUTH SURGERY    . TONSILLECTOMY      OB History    Gravida Para Term Preterm AB Living   1             SAB TAB Ectopic Multiple Live Births                   Home Medications    Prior to Admission medications   Medication Sig Start Date End Date Taking? Authorizing Provider  Diclofenac Sodium (PENNSAID) 2 % SOLN Apply 1 pump twice daily. 11/15/15   Lyndal Pulley, DO  escitalopram (LEXAPRO) 10 MG tablet Take 1.5 tablets (15 mg total) by mouth daily. 10/04/15   Biagio Borg, MD  ibuprofen (ADVIL,MOTRIN) 200 MG tablet Take 200 mg by mouth every 6 (six) hours  as needed.    Historical Provider, MD    Family History Family History  Problem Relation Age of Onset  . Breast cancer Maternal Grandmother   . Heart disease Maternal Grandmother   . Pancreatic cancer      M . great grandfather  . Lung cancer Paternal Grandfather   . Kidney cancer Maternal Aunt   . Heart disease Father   . Heart disease Maternal Grandfather     Social History Social History  Substance Use Topics  . Smoking status: Never Smoker  . Smokeless tobacco: Never Used  . Alcohol use No     Comment: OCC.     Allergies   Azithromycin;  Hydrocodone; and Morphine   Review of Systems Review of Systems   10 systems reviewed and all are negative for acute change except as noted in the HPI.   Physical Exam Updated Vital Signs BP 115/68   Pulse 83   Temp 98.2 F (36.8 C)   Resp 16   Ht 5\' 6"  (1.676 m)   Wt 120 lb (54.4 kg)   LMP 11/09/2015   SpO2 100%   BMI 19.37 kg/m   Physical Exam  Constitutional: She is oriented to person, place, and time. She appears well-developed and well-nourished. No distress.  HENT:  Head: Normocephalic and atraumatic.  Mouth/Throat: Oropharynx is clear and moist.  Eyes: Conjunctivae and EOM are normal. Pupils are equal, round, and reactive to light.  Neck: Normal range of motion.  Cardiovascular: Normal rate, regular rhythm and intact distal pulses.   Pulmonary/Chest: Effort normal and breath sounds normal. No respiratory distress. She has no wheezes. She has no rales. She exhibits no tenderness.  Abdominal: Soft. She exhibits no distension and no mass. There is no tenderness. There is no rebound and no guarding. No hernia.  Musculoskeletal: Normal range of motion.  Neurological: She is alert and oriented to person, place, and time.  No point tenderness to percussion of lumbar spinal processes.  No TTP or paraspinal muscular spasm. Strength is 5 out of 5 to bilateral lower extremities at hip and knee; extensor hallucis longus 5 out of 5. Ankle strength 5 out of 5, no clonus, neurovascularly intact. No saddle anaesthesia. Patellar reflexes are 2+ bilaterally.       Skin: Capillary refill takes less than 2 seconds. She is not diaphoretic. No erythema.  Psychiatric: She has a normal mood and affect.  Nursing note and vitals reviewed.    ED Treatments / Results  DIAGNOSTIC STUDIES:  Oxygen Saturation is 100% on RA, normal by my interpretation.    COORDINATION OF CARE:  8:44 PM Discussed treatment plan with pt at bedside and pt agreed to plan.   Labs (all labs ordered are  listed, but only abnormal results are displayed) Labs Reviewed  PREGNANCY, URINE    EKG  EKG Interpretation None       Radiology No results found.  Procedures Procedures (including critical care time)  Medications Ordered in ED Medications - No data to display   Initial Impression / Assessment and Plan / ED Course  I have reviewed the triage vital signs and the nursing notes.  Pertinent labs & imaging results that were available during my care of the patient were reviewed by me and considered in my medical decision making (see chart for details).  Clinical Course   Vitals:   11/23/15 2001 11/23/15 2003  BP:  115/68  Pulse:  83  Resp:  16  Temp:  98.2 F (  36.8 C)  SpO2:  100%  Weight: 54.4 kg   Height: 5\' 6"  (1.676 m)     Medications  ketorolac (TORADOL) injection 30 mg (30 mg Intramuscular Given 11/23/15 2117)    Miamor Pigott Baisch is 20 y.o. female presenting with back pain.  No neurological deficits and normal neuro exam.  Patient can walk but states is painful.  No loss of bowel or bladder control.  No concern for cauda equina.  No fever, night sweats, weight loss, h/o cancer, IVDU.  RICE protocol and pain medicine indicated and discussed with patient.  Evaluation does not show pathology that would require ongoing emergent intervention or inpatient treatment. Pt is hemodynamically stable and mentating appropriately. Discussed findings and plan with patient/guardian, who agrees with care plan. All questions answered. Return precautions discussed and outpatient follow up given.      Final Clinical Impressions(s) / ED Diagnoses   Final diagnoses:  None    New Prescriptions New Prescriptions   No medications on file   I personally performed the services described in this documentation, which was scribed in my presence. The recorded information has been reviewed and is accurate.     Monico Blitz, PA-C 11/23/15 2254    Tanna Furry, MD 12/04/15  Laureen Abrahams

## 2015-11-23 NOTE — ED Notes (Signed)
Patient is alert and oriented x3.  She was given DC instructions and follow up visit instructions.  Patient gave verbal understanding. She was DC ambulatory under her own power to home.  V/S stable.  He was not showing any signs of distress on DC 

## 2015-11-23 NOTE — ED Triage Notes (Signed)
Pt c/o lower back pain which radiates down right leg x 1 day

## 2015-11-23 NOTE — Discharge Instructions (Signed)
°  Do not combine ketorolac (toradol) with any other NSAID (motrin, ibuprofen, Advil, aleve , aspirin, naproxen etc.) Take fist ketorolac pill tomorrow, you have already had a shot today  Please follow with your primary care doctor in the next 2 days for a check-up. They must obtain records for further management.   Do not hesitate to return to the Emergency Department for any new, worsening or concerning symptoms.

## 2015-12-15 ENCOUNTER — Ambulatory Visit: Payer: BLUE CROSS/BLUE SHIELD | Admitting: Family Medicine

## 2015-12-15 NOTE — Progress Notes (Deleted)
[facility] [address] [place], [place] [address] Phone: [phone] Subjective:    CC: Childers neck and back pain f/u  [id number]  Mary Cochran is a 20 y.o. female coming in with complaint of Shoulder pain and bilateral hip pain. Has been going on for quite some time. Has responded fairly well to osteopathic manipulation. Patient continues to have unfortunately the pain. Taking the over-the-counter medications. States that it is a still a dull throbbing aching pain and seems to be most of the time. Has not started doing the exercises regularly but when she does it does seem to help. No new symptoms just worsening of previous symptoms.     Past Medical History:  Diagnosis Date  . Adenomatous polyp of colon 10/04/2015  . ALLERGIC RHINITIS 12/12/2006  . Allergy    SEASONAL  . Anxiety   . Depression 10/03/2011  . GILBERT'S SYNDROME 03/07/2010  . IBS (irritable bowel syndrome) 10/04/2015  . INSOMNIA-SLEEP DISORDER-UNSPEC 04/18/2007  . Tubular adenoma of colon 10/2014   Past Surgical History:  Procedure Laterality Date  . MOUTH SURGERY    . TONSILLECTOMY     Social History   Social History  . Marital status: Single    Spouse name: N/A  . Number of children: N/A  . 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 Smoking status: Never Smoker  . Smokeless tobacco: Never Used  . Alcohol use No     Comment: OCC.  . Drug use: No  . Sexual activity: Not on file   Other Topics Concern  . Not on file   Social History Narrative  . No narrative on file   Allergies  Allergen Reactions  . Azithromycin   . Hydrocodone     REACTION: GI upset  . Morphine    Family History  Problem Relation Age of Onset  . Breast cancer Maternal Grandmother   . Heart disease Maternal Grandmother   . Pancreatic cancer      M . great grandfather  . Lung cancer Paternal Grandfather   . Kidney cancer  Maternal Aunt   . Heart disease Father   . Heart disease Maternal Grandfather     Past medical history, social, surgical and family history all reviewed in electronic medical record.  No pertanent information unless stated regarding to the chief complaint.   Review of Systems: No headache, visual changes, nausea, vomiting, diarrhea, constipation, dizziness, abdominal pain, skin rash, fevers, chills, night sweats, weight loss, swollen lymph nodes, body aches, joint swelling, muscle aches, chest pain, shortness of breath, mood changes.   Objective  Last menstrual period 11/09/2015.  Systems examined below as of 12/15/15 General: NAD A&O x3 mood, affect normal  HEENT: Pupils equal, extraocular movements intact no nystagmus Respiratory: not short of breath at rest or with speaking Cardiovascular: No lower extremity edema, non tender Skin: Warm dry intact with no signs of infection or rash on extremities or on axial skeleton. Abdomen: Soft nontender, no masses Neuro: Cranial nerves  intact, neurovascularly intact in all extremities with 2+ DTRs and 2+ pulses. Lymph: No lymphadenopathy appreciated today  Gait normal with good balance and coordination.  MSK: Non tender with full range of motion and good stability and symmetric strength and tone of shoulders, elbows, wrist,  knee hips and ankles bilaterally.   Neck: Inspection unremarkable. No palpable stepoffs. Negative Spurling's maneuver. Full neck range of motion  Grip strength and sensation normal in bilateral hands Strength good C4 to T1 distribution No sensory change to C4 to T1 Negative Hoffman sign bilaterally Reflexes normal   Back Exam:  Inspection: Unremarkable  Motion: Flexion 45 deg, Extension 25 deg Mild worsening pain, Side Bending to 35 deg bilaterally,  Rotation to 35 deg bilaterally  SLR laying: Negative  XSLR laying: Negative  Palpable tenderness: Pain over the sacroiliac joints now bilaterally FABER: Positive  right . Sensory change: Gross sensation intact to all lumbar and sacral dermatomes.  Reflexes: 2+ at both patellar tendons, 2+ at achilles tendons, Babinski's downgoing.  Strength at foot  Plantar-flexion: 5/5 Dorsi-flexion: 5/5 Eversion: 5/5 Inversion: 5/5  Leg strength  Quad: 5/5 Hamstring: 5/5 Hip flexor: 5/5 Hip abductors: 4+/5  Gait unremarkable.  Osteopathic findings.  C2 Flexed rotated and side bent  T3 Extended rotated and side bent right T9 Extended rotated and side bent left  L3 Flexed rotated and side bent left  Sacrum right on right.     Impression and Recommendations:     This case required medical decision making of moderate complexity.      Note: This dictation was prepared with Dragon dictation along with smaller phrase technology. Any transcriptional errors that result from this process are unintentional.

## 2016-01-02 ENCOUNTER — Encounter: Payer: Self-pay | Admitting: Family Medicine

## 2016-01-02 ENCOUNTER — Ambulatory Visit (INDEPENDENT_AMBULATORY_CARE_PROVIDER_SITE_OTHER): Payer: BLUE CROSS/BLUE SHIELD | Admitting: Family Medicine

## 2016-01-02 VITALS — BP 112/74 | HR 76 | Temp 98.7°F | Wt 125.2 lb

## 2016-01-02 DIAGNOSIS — J029 Acute pharyngitis, unspecified: Secondary | ICD-10-CM | POA: Diagnosis not present

## 2016-01-02 DIAGNOSIS — J069 Acute upper respiratory infection, unspecified: Secondary | ICD-10-CM | POA: Diagnosis not present

## 2016-01-02 LAB — POCT RAPID STREP A (OFFICE): Rapid Strep A Screen: NEGATIVE

## 2016-01-02 NOTE — Progress Notes (Signed)
Pre visit review using our clinic review tool, if applicable. No additional management support is needed unless otherwise documented below in the visit note. 

## 2016-01-02 NOTE — Progress Notes (Signed)
Subjective:    Patient ID: Mary Cochran, female    DOB: 1995-10-24, 20 y.o.   MRN: [id number]  HPI  Mary Cochran is a 20 year old female who presents today with a sore throat that has been present for 2 to 3 days.  Associated symptoms of post nasal drip, headache, chills, cough that is productive of green mucous.   Denies fever, sweats, N/V/D, SOB, sinus pressure/pain, ear pain, or tooth pain Treatment with Mucinex and cough drops that has provided limited benefit. She denies recent antibiotic therapy, recent sick contact exposure, or history of asthma/bronchitis. She is a nonsmoker.    Review of Systems  Constitutional: Positive for chills. Negative for fatigue and fever.  HENT: Positive for congestion, postnasal drip, rhinorrhea and sore throat. Negative for sinus pain and sinus pressure.   Respiratory: Positive for cough. Negative for shortness of breath and wheezing.   Cardiovascular: Negative for chest pain and palpitations.  Gastrointestinal: Negative for abdominal pain, constipation, diarrhea, nausea and vomiting.  Musculoskeletal: Negative for myalgias.  Skin: Negative for rash.  Neurological: Negative for dizziness, weakness, light-headedness, numbness and headaches.   Past Medical History:  Diagnosis Date  . Adenomatous polyp of colon 10/04/2015  . ALLERGIC RHINITIS 12/12/2006  . Allergy    SEASONAL  . Anxiety   . Depression 10/03/2011  . GILBERT'S SYNDROME 03/07/2010  . IBS (irritable bowel syndrome) 10/04/2015  . INSOMNIA-SLEEP DISORDER-UNSPEC 04/18/2007  . Tubular adenoma of colon 10/2014     Social History   Social History  . Marital status: Single    Spouse name: N/A  . Number of children: N/A  . 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 Smoking status: Never Smoker  . Smokeless tobacco: Never Used  . Alcohol use No     Comment: OCC.  . Drug use: No  . Sexual activity: Not on file   Other  Topics Concern  . Not on file   Social History Narrative  . No narrative on file    Past Surgical History:  Procedure Laterality Date  . MOUTH SURGERY    . TONSILLECTOMY      Family History  Problem Relation Age of Onset  . Breast cancer Maternal Grandmother   . Heart disease Maternal Grandmother   . Pancreatic cancer      M . great grandfather  . Lung cancer Paternal Grandfather   . Kidney cancer Maternal Aunt   . Heart disease Father   . Heart disease Maternal Grandfather     Allergies  Allergen Reactions  . Azithromycin   . Hydrocodone     REACTION: GI upset  . Morphine     Current Outpatient Prescriptions on File Prior to Visit  Medication Sig Dispense Refill  . escitalopram (LEXAPRO) 10 MG tablet Take 1.5 tablets (15 mg total) by mouth daily. 90 tablet 3  . ibuprofen (ADVIL,MOTRIN) 200 MG tablet Take 200 mg by mouth every 6 (six) hours as needed.     No current facility-administered medications on file prior to visit.     BP 112/74 (BP Location: Left Arm, Patient Position: Sitting, Cuff Size: Normal)   Pulse 76   Temp 98.7 F (37.1 C) (Oral)   Wt 125 lb 3.2 oz (56.8 kg)   LMP 11/09/2015   SpO2 97%   BMI 20.21 kg/m       Objective:   Physical Exam  Constitutional: She is  oriented to person, place, and time. She appears well-developed and well-nourished.  HENT:  Right Ear: Tympanic membrane normal.  Left Ear: Tympanic membrane normal.  Nose: Rhinorrhea present. Right sinus exhibits no maxillary sinus tenderness and no frontal sinus tenderness. Left sinus exhibits no maxillary sinus tenderness and no frontal sinus tenderness.  Mouth/Throat: Mucous membranes are normal. Posterior oropharyngeal erythema present. No oropharyngeal exudate.  Eyes: Pupils are equal, round, and reactive to light. No scleral icterus.  Neck: Neck supple.  Cardiovascular: Normal rate and regular rhythm.   Pulmonary/Chest: Effort normal and breath sounds normal. She has no  wheezes. She has no rales.  Abdominal: Soft. Bowel sounds are normal. There is no tenderness.  Lymphadenopathy:    She has cervical adenopathy.  Neurological: She is alert and oriented to person, place, and time. Coordination normal.  Skin: Skin is warm and dry. No rash noted.       Assessment & Plan:  1. Acute upper respiratory infection Advised patient on supportive measures:  Get rest, drink plenty of fluids, and use tylenol or ibuprofen as needed for pain. Follow up if fever >101, if symptoms worsen or if symptoms are not improved in 3 to 4 days. Mucinex DM for cough as needed and you can add either Allegra, Claritin, or Zyrtec for post nasal drip.  2. Sore throat POC strep Negative. Suspect viral etiology and post nasal drip.  - POCT rapid strep A   Delano Metz, FNP-C

## 2016-01-02 NOTE — Patient Instructions (Signed)
Your symptoms are most likely related to a viral illness. Please drink plenty of water so that your urine is pale yellow or clear. Also, get plenty of rest, use tylenol or ibuprofen as needed for discomfort and follow up if symptoms do not improve in 3 to 4 days, worsen, or you develop a fever >101. You may also use Mucinex DM for cough as needed and you can add Allegra, Claritin, or Zyrtec for post nasal drip as we discussed.   Upper Respiratory Infection, Adult Most upper respiratory infections (URIs) are caused by a virus. A URI affects the nose, throat, and upper air passages. The most common type of URI is often called "the common cold." Follow these instructions at home:  Take medicines only as told by your doctor.  Gargle warm saltwater or take cough drops to comfort your throat as told by your doctor.  Use a warm mist humidifier or inhale steam from a shower to increase air moisture. This may make it easier to breathe.  Drink enough fluid to keep your pee (urine) clear or pale yellow.  Eat soups and other clear broths.  Have a healthy diet.  Rest as needed.  Go back to work when your fever is gone or your doctor says it is okay.  You may need to stay home longer to avoid giving your URI to others.  You can also wear a face mask and wash your hands often to prevent spread of the virus.  Use your inhaler more if you have asthma.  Do not use any tobacco products, including cigarettes, chewing tobacco, or electronic cigarettes. If you need help quitting, ask your doctor. Contact a doctor if:  You are getting worse, not better.  Your symptoms are not helped by medicine.  You have chills.  You are getting more short of breath.  You have brown or red mucus.  You have yellow or brown discharge from your nose.  You have pain in your face, especially when you bend forward.  You have a fever.  You have puffy (swollen) neck glands.  You have pain while  swallowing.  You have white areas in the back of your throat. Get help right away if:  You have very bad or constant:  Headache.  Ear pain.  Pain in your forehead, behind your eyes, and over your cheekbones (sinus pain).  Chest pain.  You have long-lasting (chronic) lung disease and any of the following:  Wheezing.  Long-lasting cough.  Coughing up blood.  A change in your usual mucus.  You have a stiff neck.  You have changes in your:  Vision.  Hearing.  Thinking.  Mood. This information is not intended to replace advice given to you by your health care provider. Make sure you discuss any questions you have with your health care provider. Document Released: 07/11/2007 Document Revised: 09/25/2015 Document Reviewed: 04/29/2013 Elsevier Interactive Patient Education  2017 [employer].

## 2016-03-02 ENCOUNTER — Ambulatory Visit: Payer: Self-pay | Admitting: Internal Medicine

## 2016-05-07 ENCOUNTER — Other Ambulatory Visit: Payer: Self-pay | Admitting: Internal Medicine

## 2016-05-08 NOTE — Telephone Encounter (Signed)
Done erx 

## 2016-05-28 ENCOUNTER — Telehealth: Payer: Self-pay | Admitting: Internal Medicine

## 2016-05-28 ENCOUNTER — Ambulatory Visit (INDEPENDENT_AMBULATORY_CARE_PROVIDER_SITE_OTHER): Payer: BLUE CROSS/BLUE SHIELD | Admitting: Physician Assistant

## 2016-05-28 ENCOUNTER — Ambulatory Visit (INDEPENDENT_AMBULATORY_CARE_PROVIDER_SITE_OTHER): Payer: BLUE CROSS/BLUE SHIELD

## 2016-05-28 ENCOUNTER — Encounter: Payer: Self-pay | Admitting: Physician Assistant

## 2016-05-28 VITALS — BP 100/70 | HR 79 | Temp 98.3°F | Ht 66.0 in | Wt 125.0 lb

## 2016-05-28 DIAGNOSIS — J069 Acute upper respiratory infection, unspecified: Secondary | ICD-10-CM

## 2016-05-28 DIAGNOSIS — R05 Cough: Secondary | ICD-10-CM | POA: Diagnosis not present

## 2016-05-28 DIAGNOSIS — R059 Cough, unspecified: Secondary | ICD-10-CM

## 2016-05-28 MED ORDER — PSEUDOEPH-BROMPHEN-DM 30-2-10 MG/5ML PO SYRP: 2.5000 mL | ORAL_SOLUTION | Freq: Every evening | ORAL | 0 refills | Status: DC | PRN

## 2016-05-28 MED ORDER — ALBUTEROL SULFATE HFA 108 (90 BASE) MCG/ACT IN AERS: 2.0000 | INHALATION_SPRAY | Freq: Four times a day (QID) | RESPIRATORY_TRACT | 0 refills | Status: DC | PRN

## 2016-05-28 NOTE — Progress Notes (Signed)
Pre visit review using our clinic review tool, if applicable. No additional management support is needed unless otherwise documented below in the visit note. 

## 2016-05-28 NOTE — Telephone Encounter (Signed)
Ok with me 

## 2016-05-28 NOTE — Progress Notes (Signed)
Mary Cochran is a 21 y.o. female here for cough, chest congestion, fever and SOB.  I acted as a Education administrator for [employer], PA-C Mary Pickler, LPN  History of Present Illness:   Chief Complaint  Patient presents with  . Cough    expectorating yellow sputum  . Chest congestion  . Fever    101-102 yesterday    HPI   Flu swab was negative at Urgent Care yesterday. 101-102 temperature consistently yesterday.  Hurts to cough, and now coughing up yellow and green mucus. No nasal congestion or sinus pressure. Took Dayquil today. Did not receive a flu shot this year. 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 No ear pain. No issues with urination, no hx of UTI or kidney infection. Doing well with staying hydrated. No personal hx of asthma or PNA. No GI issues. Good appetite. Non-smoker.  Denies urinary symptoms.   PMHx, SurgHx, SocialHx, Medications, and Allergies were reviewed in the Visit Navigator and updated as appropriate.  Current Medications:   Current Outpatient Prescriptions:  .  escitalopram (LEXAPRO) 10 MG tablet, TAKE 1.5 TABLETS (15 MG TOTAL) BY MOUTH DAILY., Disp: 90 tablet, Rfl: 1 .  ibuprofen (ADVIL,MOTRIN) 200 MG tablet, Take 200 mg by mouth every 6 (six) hours as needed., Disp: , Rfl:  .  albuterol (PROVENTIL HFA;VENTOLIN HFA) 108 (90 Base) MCG/ACT inhaler, Inhale 2 puffs into the lungs every 6 (six) hours as needed for wheezing or shortness of breath., Disp: 1 Inhaler, Rfl: 0 .  brompheniramine-pseudoephedrine-DM 30-2-10 MG/5ML syrup, Take 2.5 mLs by mouth at bedtime as needed., Disp: 120 mL, Rfl: 0   Review of Systems:   Review of Systems  Constitutional: Positive for chills, fever and malaise/fatigue.  HENT: Positive for congestion.   Respiratory: Positive for cough, sputum production and shortness of breath.   Gastrointestinal: Negative.   Neurological: Positive for dizziness, weakness and headaches.    Vitals:    Vitals:   05/28/16 1430  BP: 100/70  Pulse: 79  Temp: 98.3 F (36.8 C)  TempSrc: Oral  SpO2: 97%  Weight: 125 lb (56.7 kg)  Height: 5\' 6"  (1.676 m)     Body mass index is 20.18 kg/m.  Physical Exam:   Physical Exam  Constitutional: She appears well-developed. She is cooperative.  Non-toxic appearance. She does not have a sickly appearance. She does not appear ill. No distress.  HENT:  Head: Normocephalic and atraumatic.  Right Ear: Tympanic membrane, external ear and ear canal normal. Tympanic membrane is not erythematous, not retracted and not bulging.  Left Ear: Tympanic membrane, external ear and ear canal normal. Tympanic membrane is not erythematous, not retracted and not bulging.  Nose: Nose normal. Right sinus exhibits no maxillary sinus tenderness and no frontal sinus tenderness. Left sinus exhibits no maxillary sinus tenderness and no frontal sinus tenderness.  Mouth/Throat: Uvula is midline. No posterior oropharyngeal edema or posterior oropharyngeal erythema.  Eyes: Conjunctivae and lids are normal.  Neck: Trachea normal.  Cardiovascular: Normal rate, regular rhythm, S1 normal, S2 normal and normal heart sounds.   Pulmonary/Chest: Effort normal. She has decreased breath sounds. She has no wheezes. She has no rhonchi. She has no rales.  Lymphadenopathy:    She has no cervical adenopathy.  Neurological: She is alert.  Skin: Skin is warm, dry and intact.  Psychiatric: She has a normal mood and affect. Her speech is normal and behavior is normal.  Nursing note and vitals reviewed.  CXR PA and lateral:  IMPRESSION: No active cardiopulmonary disease.   Assessment and Plan:    [name] was seen today for cough, chest congestion and fever.  Diagnoses and all orders for this visit:  Upper respiratory tract infection, unspecified type Chest xray without evidence of acute pulmonary disease. I suspect upper respiratory virus. Flu test was negative at the urgent  care. Will treat with the patient with symptomatic care -- including rest, fluids, alternating tylenol and ibuprofen. I would like for patient to follow-up with PCP if symptoms do not improve. Fever has currently resolved. Albuterol prn for coughing spells. Bromfed for cough.  Other orders -     brompheniramine-pseudoephedrine-DM 30-2-10 MG/5ML syrup; Take 2.5 mLs by mouth at bedtime as needed. -     albuterol (PROVENTIL HFA;VENTOLIN HFA) 108 (90 Base) MCG/ACT inhaler; Inhale 2 puffs into the lungs every 6 (six) hours as needed for wheezing or shortness of breath.   . Reviewed expectations re: course of current medical issues. . Discussed self-management of symptoms. . Outlined signs and symptoms indicating need for more acute intervention. . Patient verbalized understanding and all questions were answered. . See orders for this visit as documented in the electronic medical record. . Patient received an After-Visit Summary.  CMA or LPN served as scribe during this visit. History, Physical, and Plan performed by medical provider. Documentation and orders reviewed and attested to.  [name], PA-C

## 2016-05-28 NOTE — Patient Instructions (Addendum)
It was great meeting you today!  We will call you with your xray results. You may pick up the cough medicine at the pharmacy, however we may be calling in additional prescriptions based on the result of your xray.   You may alternate 500 mg Tylenol and 200 mg Ibuprofen every 4-6 hours.  Stay hydrated and rest.  Please let us know if you develop worsening fevers or shortness of breath.

## 2016-05-28 NOTE — Telephone Encounter (Signed)
Patient would like to switch PCP. Wanted OK from both providers.

## 2016-05-29 ENCOUNTER — Ambulatory Visit: Payer: BLUE CROSS/BLUE SHIELD | Admitting: Internal Medicine

## 2016-05-29 ENCOUNTER — Ambulatory Visit: Payer: BLUE CROSS/BLUE SHIELD | Admitting: Family Medicine

## 2016-06-29 ENCOUNTER — Other Ambulatory Visit: Payer: Self-pay | Admitting: Physical Medicine and Rehabilitation

## 2016-06-29 DIAGNOSIS — R220 Localized swelling, mass and lump, head: Secondary | ICD-10-CM

## 2016-07-06 ENCOUNTER — Ambulatory Visit
Admission: RE | Admit: 2016-07-06 | Discharge: 2016-07-06 | Disposition: A | Discharge status: Home / Self Care | Payer: BLUE CROSS/BLUE SHIELD | Source: Ambulatory Visit | Attending: Physical Medicine and Rehabilitation | Admitting: Physical Medicine and Rehabilitation

## 2016-07-06 DIAGNOSIS — R220 Localized swelling, mass and lump, head: Secondary | ICD-10-CM

## 2016-07-16 ENCOUNTER — Encounter: Payer: Self-pay | Admitting: Physician Assistant

## 2016-07-16 ENCOUNTER — Ambulatory Visit (INDEPENDENT_AMBULATORY_CARE_PROVIDER_SITE_OTHER): Payer: BLUE CROSS/BLUE SHIELD | Admitting: Physician Assistant

## 2016-07-16 VITALS — BP 100/66 | HR 64 | Temp 98.3°F | Ht 66.0 in | Wt 121.0 lb

## 2016-07-16 DIAGNOSIS — Z3169 Encounter for other general counseling and advice on procreation: Secondary | ICD-10-CM

## 2016-07-16 DIAGNOSIS — F419 Anxiety disorder, unspecified: Secondary | ICD-10-CM | POA: Diagnosis not present

## 2016-07-16 DIAGNOSIS — F32A Depression, unspecified: Secondary | ICD-10-CM

## 2016-07-16 DIAGNOSIS — F329 Major depressive disorder, single episode, unspecified: Secondary | ICD-10-CM

## 2016-07-16 MED ORDER — SERTRALINE HCL 25 MG PO TABS: 25.0000 mg | ORAL_TABLET | Freq: Every day | ORAL | 0 refills | Status: DC

## 2016-07-16 NOTE — Progress Notes (Signed)
Mary Cochran is a 21 y.o. female here to Transfer Care to our office.  I acted as a Education administrator for [employer], PA-C Mary Pickler, LPN  History of Present Illness:   Chief Complaint  Patient presents with  . Establish Care    Transfer from [facility] office Dr. Jenny Cochran    Acute Concerns: Trying to conceive -- she has been trying to conceive for the past 2 months. Thought she was pregnant last month because her period was 7 days late, but tests were negative. She was on oral contraceptives for 6-7 years prior to trying to conceive. Her husband had varicoceles and had surgery last year for this, has consistent follow-up with his MD for this. Neither him or her smoke tobacco products or drink excessive alcohol.  Chronic Issues: Anxiety + Depression -- has been on lexapro since 2012-2013. Currently on Lexapro 20 mg. Feels like it kind of numbs her. Used to go to therapy but doesn't anymore. Has been trying to conceive x 2 months. She is interested in switching to something else. Has intermittent issues with sleeping, however lately sleep has been good for her.  Health Maintenance: Immunizations --  Up to date PAP -- 2017, normal - requesting records Diet -- well balanced, however avoids red meat Sleep habits -- sleep is good for her lately which is unusual for her Exercise -- active, does a lot of yard work Weight -- Weight: 121 lb (54.9 kg) -- 115-125 lb  Depression screen PHQ 2/9 07/16/2016  Decreased Interest 1  Down, Depressed, Hopeless 1  PHQ - 2 Score 2  Altered sleeping 1  Tired, decreased energy 1  Change in appetite 0  Feeling bad or failure about yourself  1  Trouble concentrating 0  Moving slowly or fidgety/restless 0  Suicidal thoughts 0  PHQ-9 Score 5    GAD 7 : Generalized Anxiety Score 07/16/2016  Nervous, Anxious, on Edge 1  Control/stop worrying 1  Worry too much - different things 2  Trouble relaxing 1  Restless 1  Easily annoyed or irritable 1  Afraid -  awful might happen 1  Total GAD 7 Score 8    Other providers/specialists: Ob-Gyn at [facility] Orthopedist for bulging disc Sports Medicine for multiple ortho issues -- Dr. Charlann Cochran Neurology for migraines -- Dr. Loretta Cochran  PMHx, SurgHx, SocialHx, Medications, and Allergies were reviewed in the Visit Navigator and updated as appropriate.  Current Medications:   Current Outpatient Prescriptions:  .  albuterol (PROVENTIL HFA;VENTOLIN HFA) 108 (90 Base) MCG/ACT inhaler, Inhale 2 puffs into the lungs every 6 (six) hours as needed for wheezing or shortness of breath., Disp: 1 Inhaler, Rfl: 0 .  escitalopram (LEXAPRO) 10 MG tablet, TAKE 1.5 TABLETS (15 MG TOTAL) BY MOUTH DAILY. (Patient taking differently: Take 20 mg by mouth daily. ), Disp: 90 tablet, Rfl: 1 .  ibuprofen (ADVIL,MOTRIN) 800 MG tablet, Take 800 mg by mouth as needed., Disp: , Rfl:  .  ibuprofen (ADVIL,MOTRIN) 200 MG tablet, Take 200 mg by mouth every 6 (six) hours as needed., Disp: , Rfl:  .  sertraline (ZOLOFT) 25 MG tablet, Take 1 tablet (25 mg total) by mouth daily., Disp: 90 tablet, Rfl: 0   Review of Systems:   Review of Systems  Constitutional: Negative for chills, fever, malaise/fatigue and weight loss.  HENT: Negative for hearing loss.   Eyes: Negative for blurred vision, double vision and photophobia.  Respiratory: Negative for shortness of breath.   Cardiovascular: Negative  for chest pain and orthopnea.  Neurological: Negative for dizziness, tingling, tremors and headaches.  Psychiatric/Behavioral: Negative for depression and suicidal ideas. The patient is not nervous/anxious.     Vitals:   Vitals:   07/16/16 1332  BP: 100/66  Pulse: 64  Temp: 98.3 F (36.8 C)  TempSrc: Oral  SpO2: 97%  Weight: 121 lb (54.9 kg)  Height: 5\' 6"  (1.676 m)     Body mass index is 19.53 kg/m.  Physical Exam:   Physical Exam  Constitutional: She appears well-developed. She is cooperative.  Non-toxic  appearance. She does not have a sickly appearance. She does not appear ill. No distress.  Cardiovascular: Normal rate, regular rhythm, S1 normal, S2 normal, normal heart sounds and normal pulses.   No LE edema  Pulmonary/Chest: Effort normal and breath sounds normal.  Neurological: She is alert.  Nursing note and vitals reviewed.     Assessment and Plan:    Mary Cochran was seen today for establish care.  Diagnoses and all orders for this visit:  Anxiety GAD-7 is 8 today. She has anxiety about trying to get pregnant. Provided reassurance and emotional support. Discussed weaning off of lexapro and starting 25 mg zoloft. She is agreeable to this. Will follow-up with her in 3 months with this medication change, sooner if needed.  Encounter for preconception consultation Discussed need to continue healthy diet, exercise and avoidance of smoking/alcohol. Start prenatals today. Work on Child psychotherapist as able. Track periods and sexual activity on apps.  Depression, unspecified depression type PHQ-9 today is 5. Denies significant depression or SI/HI thoughts. Discussed switching to Zoloft, patient is agreeable to this, see above. Follow-up in 3 months, sooner if any concerns.   Other orders -     sertraline (ZOLOFT) 25 MG tablet; Take 1 tablet (25 mg total) by mouth daily.  . Reviewed expectations re: course of current medical issues. . Discussed self-management of symptoms. . Outlined signs and symptoms indicating need for more acute intervention. . Patient verbalized understanding and all questions were answered. . See orders for this visit as documented in the electronic medical record. . Patient received an After-Visit Summary.  CMA or LPN served as scribe during this visit. History, Physical, and Plan performed by medical provider. Documentation and orders reviewed and attested to.  [name], PA-C

## 2016-07-16 NOTE — Patient Instructions (Signed)
Start a prenatal daily.    Decrease to 10 mg Lexapro x 1 week. Then discontinue and start Zoloft 25 mg. Follow-up with [place] in 3 months, sooner if needed to discuss medication effectiveness.

## 2016-08-21 ENCOUNTER — Ambulatory Visit (INDEPENDENT_AMBULATORY_CARE_PROVIDER_SITE_OTHER): Payer: BLUE CROSS/BLUE SHIELD | Admitting: Physician Assistant

## 2016-08-21 ENCOUNTER — Encounter: Payer: Self-pay | Admitting: Physician Assistant

## 2016-08-21 VITALS — BP 110/64 | HR 88 | Temp 99.8°F | Ht 66.0 in | Wt 123.4 lb

## 2016-08-21 DIAGNOSIS — N3090 Cystitis, unspecified without hematuria: Secondary | ICD-10-CM

## 2016-08-21 LAB — POCT URINALYSIS DIPSTICK
Bilirubin, UA: NEGATIVE
Glucose, UA: NEGATIVE
Ketones, UA: NEGATIVE
NITRITE UA: POSITIVE
PH UA: 6.5 (ref 5.0–8.0)
Spec Grav, UA: 1.01 (ref 1.010–1.025)
UROBILINOGEN UA: 2 U/dL — AB

## 2016-08-21 MED ORDER — NITROFURANTOIN MONOHYD MACRO 100 MG PO CAPS: 100.0000 mg | ORAL_CAPSULE | Freq: Two times a day (BID) | ORAL | 0 refills | Status: DC

## 2016-08-21 NOTE — Patient Instructions (Signed)

## 2016-08-21 NOTE — Progress Notes (Signed)
Mary Cochran is a 21 y.o. female here for dysuria and urinary frequency.  I acted as a Education administrator for [employer], PA-C Anselmo Pickler, LPN  History of Present Illness:   Chief Complaint  Patient presents with  . Dysuria  . Urinary Frequency    Dysuria   This is a new problem. The current episode started today (around 1:30 AM started). The problem occurs every urination. The problem has been waxing and waning. The quality of the pain is described as burning. The pain is at a severity of 9/10. The pain is moderate. Associated symptoms include chills, frequency, hematuria, nausea and urgency. Pertinent negatives include no flank pain or vomiting. Associated symptoms comments: Low pelvic pressure, urine has foul odor and fatigue.. She has tried NSAIDs (Azo) for the symptoms. The treatment provided mild relief.   She and her husband are trying to get pregnant. She has been having frequent sexual intercourse and tries to stay recumbent for several minutes after sex and therefore doesn't void immediately afterwards. She has no prior hx of UTI or kidney infection. LMP 08/05/16.  Past Medical History:  Diagnosis Date  . Adenomatous polyp of colon 10/04/2015  . ALLERGIC RHINITIS 12/12/2006  . Allergy    SEASONAL  . Anxiety   . Depression 10/03/2011  . GILBERT'S SYNDROME 03/07/2010  . IBS (irritable bowel syndrome) 10/04/2015  . INSOMNIA-SLEEP DISORDER-UNSPEC 04/18/2007  . Tubular adenoma of colon 10/2014     Social History   Social History  . Marital status: Single    Spouse name: N/A  . Number of children: N/A  . 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 Smoking status: Never Smoker  . Smokeless tobacco: Never Used  . Alcohol use No     Comment: OCC.  . Drug use: No  . Sexual activity: Not on file   Other Topics Concern  . 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 MOUTH SURGERY    . TONSILLECTOMY      Family History  Problem Relation Age of Onset  . Breast cancer Maternal Grandmother   . Heart disease Maternal Grandmother   . Pancreatic cancer Unknown        M . great grandfather  . Lung cancer Paternal Grandfather   . Kidney cancer Maternal Aunt   . Heart disease Father   . Heart disease Maternal Grandfather     Allergies  Allergen Reactions  . Azithromycin   . Hydrocodone     REACTION: GI upset  . Morphine     Current Medications:   Current Outpatient Prescriptions:  .  albuterol (PROVENTIL HFA;VENTOLIN HFA) 108 (90 Base) MCG/ACT inhaler, Inhale 2 puffs into the lungs every 6 (six) hours as needed for wheezing or shortness of breath., Disp: 1 Inhaler, Rfl: 0 .  ibuprofen (ADVIL,MOTRIN) 800 MG tablet, Take 800 mg by mouth as needed., Disp: , Rfl:  .  sertraline (ZOLOFT) 25 MG tablet, Take 1 tablet (25 mg total) by mouth daily., Disp: 90 tablet, Rfl: 0 .  nitrofurantoin, macrocrystal-monohydrate, (MACROBID) 100 MG capsule, Take 1 capsule (100 mg total) by mouth 2 (two) times daily., Disp: 10 capsule, Rfl: 0   Review of Systems:   Review of Systems  Constitutional: Positive for chills.  Gastrointestinal: Positive  for nausea. Negative for vomiting.  Genitourinary: Positive for dysuria, frequency, hematuria and urgency. Negative for flank pain.    Vitals:   Vitals:   08/21/16 1303  BP: 110/64  Pulse: 88  Temp: 99.8 F (37.7 C)  TempSrc: Oral  SpO2: 96%  Weight: 123 lb 6.1 oz (56 kg)  Height: 5\' 6"  (1.676 m)     Body mass index is 19.91 kg/m.  Physical Exam:   Physical Exam  Constitutional: She appears well-developed. She is cooperative.  Non-toxic appearance. She does not have a sickly appearance. She does not appear ill. No distress.  Cardiovascular: Normal rate, regular rhythm, S1 normal, S2 normal, normal heart sounds and normal  pulses.   No LE edema  Pulmonary/Chest: Effort normal and breath sounds normal.  Abdominal: Normal appearance and bowel sounds are normal. There is no tenderness. There is no rigidity, no rebound, no guarding and no CVA tenderness.  Neurological: She is alert. GCS eye subscore is 4. GCS verbal subscore is 5. GCS motor subscore is 6.  Skin: Skin is warm, dry and intact.  Psychiatric: She has a normal mood and affect. Her speech is normal and behavior is normal.  Nursing note and vitals reviewed.   Results for orders placed or performed in visit on 08/21/16  POCT urinalysis dipstick  Result Value Ref Range   Color, UA Orange    Clarity, UA Clear    Glucose, UA Negative    Bilirubin, UA Negative    Ketones, UA Negative    Spec Grav, UA 1.010 1.010 - 1.025   Blood, UA 200 Ery/uL    pH, UA 6.5 5.0 - 8.0   Protein, UA 15 mg/dL    Urobilinogen, UA 2.0 (A) 0.2 or 1.0 E.U./dL   Nitrite, UA Positive    Leukocytes, UA Large (3+) (A) Negative     Assessment and Plan:    Mary Cochran was seen today for dysuria and urinary frequency.  Diagnoses and all orders for this visit:  Cystitis POCT urine dipstick is consistent with cystitis. Will treat with Macrobid per orders. I advised that she not take AZO for more than 2 days. Reviewed return to clinic signs/symptoms. Continue to push fluids and void after sex to prevent future UTIs. -     POCT urinalysis dipstick -     Urine Culture  Other orders -     nitrofurantoin, macrocrystal-monohydrate, (MACROBID) 100 MG capsule; Take 1 capsule (100 mg total) by mouth 2 (two) times daily.    . Reviewed expectations re: course of current medical issues. . Discussed self-management of symptoms. . Outlined signs and symptoms indicating need for more acute intervention. . Patient verbalized understanding and all questions were answered. . See orders for this visit as documented in the electronic medical record. . Patient received an After-Visit  Summary.  CMA or LPN served as scribe during this visit. History, Physical, and Plan performed by medical provider. Documentation and orders reviewed and attested to.  [name], PA-C

## 2016-08-24 LAB — URINE CULTURE

## 2016-09-10 ENCOUNTER — Other Ambulatory Visit: Payer: Self-pay | Admitting: Physician Assistant

## 2016-09-10 ENCOUNTER — Telehealth: Payer: BLUE CROSS/BLUE SHIELD | Admitting: Family

## 2016-09-10 DIAGNOSIS — R399 Unspecified symptoms and signs involving the genitourinary system: Secondary | ICD-10-CM

## 2016-09-10 MED ORDER — CIPROFLOXACIN HCL 500 MG PO TABS: 500.0000 mg | ORAL_TABLET | Freq: Two times a day (BID) | ORAL | 0 refills | Status: DC

## 2016-09-10 NOTE — Progress Notes (Signed)
Voild

## 2016-09-10 NOTE — Progress Notes (Signed)

## 2016-10-19 ENCOUNTER — Other Ambulatory Visit: Payer: Self-pay | Admitting: Physician Assistant

## 2016-10-19 MED ORDER — SERTRALINE HCL 25 MG PO TABS: 25.0000 mg | ORAL_TABLET | Freq: Every day | ORAL | 0 refills | Status: DC

## 2016-10-19 NOTE — Telephone Encounter (Signed)
Left message on voicemail to call office. Pt needs to schedule an appt with Aldona Bar this month.

## 2016-10-29 ENCOUNTER — Ambulatory Visit: Payer: BLUE CROSS/BLUE SHIELD | Admitting: Physician Assistant

## 2016-11-05 ENCOUNTER — Encounter: Payer: Self-pay | Admitting: Physician Assistant

## 2016-11-05 ENCOUNTER — Ambulatory Visit (INDEPENDENT_AMBULATORY_CARE_PROVIDER_SITE_OTHER): Payer: BLUE CROSS/BLUE SHIELD | Admitting: Physician Assistant

## 2016-11-05 VITALS — BP 110/72 | HR 78 | Temp 98.9°F | Ht 66.0 in | Wt 124.2 lb

## 2016-11-05 DIAGNOSIS — F329 Major depressive disorder, single episode, unspecified: Secondary | ICD-10-CM | POA: Diagnosis not present

## 2016-11-05 DIAGNOSIS — L210 Seborrhea capitis: Secondary | ICD-10-CM | POA: Diagnosis not present

## 2016-11-05 DIAGNOSIS — X12XXXA Contact with other hot fluids, initial encounter: Secondary | ICD-10-CM

## 2016-11-05 DIAGNOSIS — F5231 Female orgasmic disorder: Secondary | ICD-10-CM | POA: Diagnosis not present

## 2016-11-05 DIAGNOSIS — T300 Burn of unspecified body region, unspecified degree: Secondary | ICD-10-CM | POA: Diagnosis not present

## 2016-11-05 DIAGNOSIS — F419 Anxiety disorder, unspecified: Secondary | ICD-10-CM | POA: Diagnosis not present

## 2016-11-05 DIAGNOSIS — F32A Depression, unspecified: Secondary | ICD-10-CM

## 2016-11-05 MED ORDER — SILVER SULFADIAZINE 1 % EX CREA: 1.0000 "application " | TOPICAL_CREAM | Freq: Every day | CUTANEOUS | 0 refills | Status: DC

## 2016-11-05 NOTE — Progress Notes (Addendum)
Mary Cochran is a 21 y.o. female is here to follow up on Anxiety and Depression.  I acted as a Education administrator for [employer], PA-C Anselmo Pickler, LPN  History of Present Illness:   Chief Complaint  Patient presents with  . Follow-up  . Anxiety  . Depression    Anxiety  Presents for follow-up visit. Symptoms include depressed mood, insomnia, irritability, muscle tension, nervous/anxious behavior and panic. Patient reports no chest pain, confusion, decreased concentration, dizziness, dry mouth, excessive worry, feeling of choking, hyperventilation, malaise, nausea, palpitations, restlessness, shortness of breath or suicidal ideas. Symptoms occur most days. The most recent episode lasted 3 minutes. The severity of symptoms is mild. The quality of sleep is fair (x 2 weeks). Nighttime awakenings: one to two.   Compliance with medications is 76-100%. Treatment side effects: unable to have orgasms, sexual dysfunction.  Depression         This is a chronic problem.  The problem occurs constantly.The problem is unchanged.  Associated symptoms include hopelessness, insomnia, irritable, decreased interest and sad.  Associated symptoms include no decreased concentration, no fatigue, no helplessness, no restlessness and no suicidal ideas.     The symptoms are aggravated by family issues.  Compliance with treatment is good.  Previous treatment provided mild relief.  Past medical history includes anxiety.    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 She had an appointment with her OB/GYN, for her annual exam, and she reports that she brought this up with her physician. She reports that she was told that "everything was normal". She has not had any imaging in terms of an HSG. She does report that her husband is going to have a semen analysis soon. She states that she has never had an orgasm and that trying to conceive has  been more of a chore because she is unable to get pleasure have a good experience. She does note that she has been on an SSRI, either Lexapro or Zoloft the entire time that she has been sexually active.  Dandruff Patient reports that she has noticed an increase in the flaking of her scalp. She has noticed an increase with the weather change. She is starting to wash her hair more frequently to see if this will help. She has not tried any regimens.  Burn  On Friday of last week, patient accidentally spilled hot tea on her arm and sustained a small first-degree burn. She has been using essential oils, Neosporin, and aloe vera with mild improvement of symptoms. She does not endorse any fevers, worsening redness, blistering. Area is sensitive and tender to palpation.  GAD 7 : Generalized Anxiety Score 11/05/2016 07/16/2016  Nervous, Anxious, on Edge 2 1  Control/stop worrying 1 1  Worry too much - different things 1 2  Trouble relaxing 1 1  Restless 0 1  Easily annoyed or irritable 2 1  Afraid - awful might happen 1 1  Total GAD 7 Score 8 8    Depression screen [facility] 2/9 11/05/2016 07/16/2016 10/04/2015  Decreased Interest 2 1 0  Down, Depressed, Hopeless 1 1 0  PHQ - 2 Score 3 2 0  Altered sleeping 1 1 -  Tired, decreased energy 1 1 -  Change in appetite 1 0 -  Feeling bad or failure about yourself  1 1 -  Trouble concentrating 2 0 -  Moving slowly or fidgety/restless 1 0 -  Suicidal thoughts 0 0 -  PHQ-9 Score 10 5 -  Difficult doing work/chores Somewhat difficult - -     Health Maintenance Due  Topic Date Due  . HIV Screening  04/19/2008    Past Medical History:  Diagnosis Date  . Adenomatous polyp of colon 10/04/2015  . ALLERGIC RHINITIS 12/12/2006  . Allergy    SEASONAL  . Anxiety   . Depression 10/03/2011  . GILBERT'S SYNDROME 03/07/2010  . IBS (irritable bowel syndrome) 10/04/2015  . INSOMNIA-SLEEP DISORDER-UNSPEC 04/18/2007  . Tubular adenoma of colon 10/2014     Social  History   Social History  . Marital status: Single    Spouse name: N/A  . Number of children: N/A  . 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 Smoking status: Never Smoker  . Smokeless tobacco: Never Used  . Alcohol use No     Comment: OCC.  . Drug use: No  . Sexual activity: Not on file   Other Topics Concern  . 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 MOUTH SURGERY    . TONSILLECTOMY      Family History  Problem Relation Age of Onset  . Breast cancer Maternal Grandmother   . Heart disease Maternal Grandmother   . Pancreatic cancer Unknown        M . great grandfather  . Lung cancer Paternal Grandfather   . Kidney cancer Maternal Aunt   . Heart disease Father   . Heart disease Maternal Grandfather     PMHx, SurgHx, SocialHx, FamHx, Medications, and Allergies were reviewed in the Visit Navigator and updated as appropriate.   Patient Active Problem List   Diagnosis Date Noted  . Nonallopathic lesion of sacral region 10/26/2015  . Nonallopathic lesion of thoracic region 10/26/2015  . Nonallopathic lesion of lumbosacral region 10/26/2015  . Scapular dysfunction 10/26/2015  . IBS (irritable bowel syndrome) 10/04/2015  . Adenomatous polyp of colon 10/04/2015  . Polyarthralgia 10/04/2015  . Eczema of both hands 09/30/2013  . Skin lesion 10/16/2012  . Vertigo 04/06/2012  . Migraine with aura 04/06/2012  . Anxiety 10/30/2011  . Depression 10/03/2011  . GILBERT'S SYNDROME 03/07/2010  . SYNCOPE 10/07/2008  . INSOMNIA-SLEEP DISORDER-UNSPEC 04/18/2007  . ALLERGIC RHINITIS 12/12/2006    Social History  Substance Use Topics  . Smoking status: Never Smoker  . Smokeless tobacco: Never Used  . Alcohol use No     Comment: OCC.    Current Medications and Allergies:     Current Outpatient Prescriptions:  .  albuterol (PROVENTIL HFA;VENTOLIN HFA) 108 (90 Base) MCG/ACT inhaler, Inhale 2 puffs into the lungs every 6 (six) hours as needed for wheezing or shortness of breath., Disp: 1 Inhaler, Rfl: 0 .  ibuprofen (ADVIL,MOTRIN) 800 MG tablet, Take 800 mg by mouth as needed., Disp: , Rfl:  .  sertraline (ZOLOFT) 25 MG tablet, Take 1 tablet (25 mg total) by mouth daily., Disp: 30 tablet, Rfl: 0 .  silver sulfADIAZINE (SILVADENE) 1 % cream, Apply 1 application topically daily., Disp: 50 g, Rfl: 0   Allergies  Allergen Reactions  . Azithromycin   . Hydrocodone     REACTION: GI upset  . Morphine     Review of Systems   Review of Systems  Constitutional:  Positive for irritability. Negative for chills, fatigue and fever.  Respiratory: Negative for shortness of breath.   Cardiovascular: Negative for chest pain and palpitations.  Gastrointestinal: Negative for nausea.  Skin:       Redness on L wrist   Neurological: Negative for dizziness.  Psychiatric/Behavioral: Positive for depression. Negative for confusion, decreased concentration and suicidal ideas. The patient is nervous/anxious and has insomnia.     Vitals:   Vitals:   11/05/16 1544  BP: 110/72  Pulse: 78  Temp: 98.9 F (37.2 C)  TempSrc: Oral  SpO2: 97%  Weight: 124 lb 4 oz (56.4 kg)  Height: 5\' 6"  (1.676 m)     Body mass index is 20.05 kg/m.   Physical Exam:    Physical Exam  Constitutional: She appears well-developed. She is irritable and cooperative.  Non-toxic appearance. She does not have a sickly appearance. She does not appear ill. No distress.  Cardiovascular: Normal rate, regular rhythm, S1 normal, S2 normal, normal heart sounds and normal pulses.   No LE edema  Pulmonary/Chest: Effort normal and breath sounds normal.  Neurological: She is alert. GCS eye subscore is 4. GCS verbal subscore is 5. GCS motor subscore is 6.  Skin: Skin is warm, dry and intact.  Left wrist  with well demarcated area of redness of approximately half to 1 inch circumferential around the wrist. Area is sensitive with touch. There is no blistering, oozing, crusting present. There is no drinking of redness.  Scalp with white flaking throughout entire surface area. I do not appreciate any breakdown of scalp or plaques.  Psychiatric: She has a normal mood and affect. Her speech is normal and behavior is normal.  Pleasant, but tearful throughout encounter  Nursing note and vitals reviewed.    Assessment and Plan:    Jaedan was seen today for follow-up, anxiety and depression.  Diagnoses and all orders for this visit:  Depression, unspecified depression type and Anxiety Patient denies any suicidal or homicidal thoughts. We had a long discussion about increasing medication versus stopping medication. A lot of her anxiety and depression is coming from lack of pregnancy, with attempts to conceive. Additionally she is having issues with orgasms which is causing her to have issues with additional stress and anxiety with attempts to conceive. After long discussion, patient is agreeable to all of Zoloft to see if this will help with her symptoms. She reports that her husband is a great support system and will help to monitor her signs and symptoms in case she does need to go back on this medication. Discussed tapering regimen. Provided in AVS. I discussed with patient that if they develop any SI, to tell someone immediately and seek medical attention.  Follow-up in one month. Sooner if needed. Additionally she does report that she responded well to therapy in the past. I recommended that she consider resuming this while coming off of her medications in dealing with her current stress in her life. I have given her Trey Paula information.  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 We are going to attempt to wean off of SSRI to see if this helps with  her symptoms. We will discuss this in one month. I also discussed with patient that uncontrolled depression and anxiety also and her ability to have orgasms, however she is in agreement to attempt SSRI weaning to see if this helps first.   Burn Silvadene cream applied in office. I recommended patient stop  using the essential oils and Neosporin. I provided a prescription of Silvadene to be used at home. Follow-up if symptoms worsen or do not improve.  Dandruff We discussed supportive care including products with tea tree oil, T-gel shampoo or use of shampoo such as Selsun Blue or Head and Shoulders. Follow-up if symptoms worsen or persist.  Other orders -     silver sulfADIAZINE (SILVADENE) 1 % cream; Apply 1 application topically daily.  . Reviewed expectations re: course of current medical issues. . Discussed self-management of symptoms. . Outlined signs and symptoms indicating need for more acute intervention. . Patient verbalized understanding and all questions were answered. . See orders for this visit as documented in the electronic medical record. . Patient received an After Visit Summary.  CMA or LPN served as scribe during this visit. History, Physical, and Plan performed by medical provider. Documentation and orders reviewed and attested to.  [name], PA-C [place] 11/06/2016  Follow-up: Return in about 1 month (around 12/06/2016) for anxiety and depression.

## 2016-11-05 NOTE — Patient Instructions (Signed)
It was great to see you!  Take 1/2 a dose of Zoloft daily x 1 week.  Then take 1/2 a dose of Zolot every other day x 1 week. Then stop medication.  If you develop any significant side effects please let us know.  If you develop any suicidal thoughts, please go to the emergency room and tell someone.  Please make an appointment with a therapist, Lattie Haw is here in our office, if you would like to see her.  Apply the silver cream as prescribed for about a week, let us know if your symptoms worsen.  For your scalp, try products containing Tea Tree, or use Head and Shoulders. We can consider prescription medication if this is ineffective.  Please follow-up in 1 month.

## 2016-11-07 ENCOUNTER — Encounter: Payer: Self-pay | Admitting: Physician Assistant

## 2016-11-07 ENCOUNTER — Telehealth: Payer: Self-pay | Admitting: *Deleted

## 2016-11-07 NOTE — Telephone Encounter (Signed)
Patient called and states she took a home pregnancy test and she seen a faint line. Patient states she is pregnant and wants to talk to [facility] about that and also some other questions that she has.. Her contact number is [phone]. Please advise. Thank you

## 2016-11-08 NOTE — Telephone Encounter (Signed)
Please see message. °

## 2016-11-08 NOTE — Telephone Encounter (Signed)
I attempted to call patient and she did not answer.  If she returns phone call, please send her to me or Butch Penny.  If she would like an appointment, please add her to my schedule.  [name] PA-C 11/08/16

## 2016-11-08 NOTE — Telephone Encounter (Signed)
I spoke with patient on the telephone after she returned call.  She reports that she took 3 urine pregnancy tests this morning and all three had a faint positive line. She has an appointment later this month with her ob-gyn office for ultrasound around 6 weeks, and she estimates that she is probably around 3-4 weeks at this time.  She called to ask if it would be okay to resume her zoloft 25 mg. At her last visit on 10/1 we discussed tapering (see office note) but she is beginning to have anxiety about her pregnancy and suspects that she will do better staying on the 25 mg zoloft than coming off of it. I told her to resume her dosage of 25 mg zoloft daily, and she may discuss with ob-gyn if further concerns.  I also discussed with patient to continue prenatal MVI. Discontinue prn ibuprofen. Avoid all alcohol. If she has any bleeding or cramping to call her ob-gyn office for further management, or go to [facility]. Patient verbalized understanding.  [name] PA-C 11/08/16

## 2016-11-13 ENCOUNTER — Other Ambulatory Visit: Payer: Self-pay | Admitting: Physician Assistant

## 2016-11-13 MED ORDER — SERTRALINE HCL 25 MG PO TABS: 25.0000 mg | ORAL_TABLET | Freq: Every day | ORAL | 0 refills | Status: DC

## 2016-11-27 ENCOUNTER — Ambulatory Visit (INDEPENDENT_AMBULATORY_CARE_PROVIDER_SITE_OTHER): Payer: BLUE CROSS/BLUE SHIELD | Admitting: Psychology

## 2016-11-27 ENCOUNTER — Ambulatory Visit: Payer: BLUE CROSS/BLUE SHIELD | Admitting: Psychology

## 2016-11-27 DIAGNOSIS — F330 Major depressive disorder, recurrent, mild: Secondary | ICD-10-CM | POA: Diagnosis not present

## 2016-12-10 ENCOUNTER — Ambulatory Visit: Payer: BLUE CROSS/BLUE SHIELD | Admitting: Physician Assistant

## 2016-12-19 ENCOUNTER — Ambulatory Visit (INDEPENDENT_AMBULATORY_CARE_PROVIDER_SITE_OTHER): Payer: BLUE CROSS/BLUE SHIELD | Admitting: Physician Assistant

## 2016-12-19 ENCOUNTER — Ambulatory Visit: Payer: BLUE CROSS/BLUE SHIELD | Admitting: Physician Assistant

## 2016-12-19 ENCOUNTER — Encounter: Payer: Self-pay | Admitting: Physician Assistant

## 2016-12-19 VITALS — BP 100/60 | HR 74 | Temp 98.4°F | Ht 66.0 in | Wt 126.2 lb

## 2016-12-19 DIAGNOSIS — F329 Major depressive disorder, single episode, unspecified: Secondary | ICD-10-CM | POA: Diagnosis not present

## 2016-12-19 DIAGNOSIS — F419 Anxiety disorder, unspecified: Secondary | ICD-10-CM | POA: Diagnosis not present

## 2016-12-19 DIAGNOSIS — F32A Depression, unspecified: Secondary | ICD-10-CM

## 2016-12-19 MED ORDER — SERTRALINE HCL 25 MG PO TABS: 25.0000 mg | ORAL_TABLET | Freq: Every day | ORAL | 1 refills | Status: DC

## 2016-12-19 NOTE — Patient Instructions (Signed)
[facility] 6:15-7:30p on Tuesday Nights --> prenatal yoga  Topical Arnica should be safe, don't use several times a day  Call our office in 2-3 weeks and Dr. Paulla Fore should hopefully be doing osteopathic manipulations by then and can hopefully adjust you if you have continued back pain

## 2016-12-19 NOTE — Progress Notes (Signed)
Mary Cochran is a 21 y.o. female is here to discuss: Anxiety and Depression.  I acted as a Education administrator for [employer], PA-C Anselmo Pickler, LPN  History of Present Illness:   Chief Complaint  Patient presents with  . Anxiety  . Depression    Anxiety  Presents for follow-up visit. Symptoms include depressed mood, insomnia, malaise, nausea, nervous/anxious behavior and restlessness. Patient reports no chest pain, dizziness, excessive worry, palpitations, panic, shortness of breath or suicidal ideas. Primary symptoms comment:  Pt is pregnant some morning sickness. Symptoms occur most days. The severity of symptoms is moderate. The quality of sleep is fair. Nighttime awakenings: one to two.   Her past medical history is significant for depression. Compliance with medications is 76-100%.  Depression       The patient presents with depression.( Pt is pregnant some morning sickness)  This is a chronic problem.  The problem occurs daily.  The problem has been gradually improving since onset.  Associated symptoms include fatigue, insomnia, restlessness and indigestion.  Associated symptoms include no helplessness, no hopelessness, no body aches, no headaches, not sad and no suicidal ideas.     The symptoms are aggravated by family issues.  Past treatments include SSRIs - Selective serotonin reuptake inhibitors.  Compliance with treatment is good.  Previous treatment provided mild relief.  Past medical history includes anxiety and depression.    She has seen Trey Paula, LCSW x 1 and has an appointment to see her again soon.  Depression screen [facility] 2/9 12/19/2016 11/05/2016 07/16/2016  Decreased Interest 1 2 1   Down, Depressed, Hopeless 1 1 1   PHQ - 2 Score 2 3 2   Altered sleeping 1 1 1   Tired, decreased energy 1 1 1   Change in appetite 1 1 0  Feeling bad or failure about yourself  1 1 1   Trouble concentrating 1 2 0  Moving slowly or fidgety/restless 0 1 0  Suicidal thoughts 0 0 0    PHQ-9 Score 7 10 5   Difficult doing work/chores Somewhat difficult Somewhat difficult -   GAD 7 : Generalized Anxiety Score 12/19/2016 11/05/2016 07/16/2016  Nervous, Anxious, on Edge 1 2 1   Control/stop worrying 1 1 1   Worry too much - different things 1 1 2   Trouble relaxing 1 1 1   Restless 0 0 1  Easily annoyed or irritable 1 2 1   Afraid - awful might happen 0 1 1  Total GAD 7 Score 5 8 8   Anxiety Difficulty Somewhat difficult - -      Health Maintenance Due  Topic Date Due  . HIV Screening  04/19/2008    Past Medical History:  Diagnosis Date  . Adenomatous polyp of colon 10/04/2015  . ALLERGIC RHINITIS 12/12/2006  . Allergy    SEASONAL  . Anxiety   . Depression 10/03/2011  . GILBERT'S SYNDROME 03/07/2010  . IBS (irritable bowel syndrome) 10/04/2015  . INSOMNIA-SLEEP DISORDER-UNSPEC 04/18/2007  . Tubular adenoma of colon 10/2014     Social History   Socioeconomic History  . Marital status: Single    Spouse name: Not on file  . Number of children: Not on file  . Years of education: Not on file  . Highest education level: Not on file  Social Needs  . Financial resource strain: Not on file  . Food insecurity - worry: Not on file  . Food insecurity - inability: Not on file  . Transportation needs - medical: Not on file  . Transportation needs -  non-medical: Not on file  Occupational History  . Occupation: Social worker  Tobacco Use  . Smoking status: Never Smoker  . Smokeless tobacco: Never Used  Substance and Sexual Activity  . Alcohol use: No    Alcohol/week: 0.0 oz    Comment: OCC.  . Drug use: No  . Sexual activity: Not on file  Other Topics Concern  . 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 MOUTH SURGERY    . TONSILLECTOMY      Family History  Problem Relation Age of Onset  . Breast cancer Maternal Grandmother   .  Heart disease Maternal Grandmother   . Pancreatic cancer Unknown        M . great grandfather  . Lung cancer Paternal Grandfather   . Kidney cancer Maternal Aunt   . Heart disease Father   . Heart disease Maternal Grandfather     PMHx, SurgHx, SocialHx, FamHx, Medications, and Allergies were reviewed in the Visit Navigator and updated as appropriate.   Patient Active Problem List   Diagnosis Date Noted  . Nonallopathic lesion of sacral region 10/26/2015  . Nonallopathic lesion of thoracic region 10/26/2015  . Nonallopathic lesion of lumbosacral region 10/26/2015  . Scapular dysfunction 10/26/2015  . IBS (irritable bowel syndrome) 10/04/2015  . Adenomatous polyp of colon 10/04/2015  . Polyarthralgia 10/04/2015  . Eczema of both hands 09/30/2013  . Skin lesion 10/16/2012  . Vertigo 04/06/2012  . Migraine with aura 04/06/2012  . Anxiety 10/30/2011  . Depression 10/03/2011  . GILBERT'S SYNDROME 03/07/2010  . SYNCOPE 10/07/2008  . INSOMNIA-SLEEP DISORDER-UNSPEC 04/18/2007  . ALLERGIC RHINITIS 12/12/2006    Social History   Tobacco Use  . Smoking status: Never Smoker  . Smokeless tobacco: Never Used  Substance Use Topics  . Alcohol use: No    Alcohol/week: 0.0 oz    Comment: OCC.  . Drug use: No    Current Medications and Allergies:    Current Outpatient Medications:  .  DICLEGIS 10-10 MG TBEC, Take 2 tablets at bedtime by mouth., Disp: , Rfl: 3 .  Prenatal Vit-Fe Fumarate-FA (PRENATAL VITAMIN PO), Take 1 tablet daily by mouth., Disp: , Rfl:  .  sertraline (ZOLOFT) 25 MG tablet, Take 1 tablet (25 mg total) daily by mouth., Disp: 90 tablet, Rfl: 1 .  albuterol (PROVENTIL HFA;VENTOLIN HFA) 108 (90 Base) MCG/ACT inhaler, Inhale 2 puffs into the lungs every 6 (six) hours as needed for wheezing or shortness of breath. (Patient not taking: Reported on 12/19/2016), Disp: 1 Inhaler, Rfl: 0 .  silver sulfADIAZINE (SILVADENE) 1 % cream, Apply 1 application topically daily.  (Patient not taking: Reported on 12/19/2016), Disp: 50 g, Rfl: 0   Allergies  Allergen Reactions  . Azithromycin   . Hydrocodone     REACTION: GI upset  . Morphine     Review of Systems   Review of Systems  Constitutional: Positive for fatigue.  Respiratory: Negative for shortness of breath.   Cardiovascular: Negative for chest pain and palpitations.  Gastrointestinal: Positive for nausea.  Neurological: Negative for dizziness and headaches.  Psychiatric/Behavioral: Positive for depression. Negative for suicidal ideas. The patient is nervous/anxious and has insomnia.     Vitals:   Vitals:   12/19/16 1641  BP: 100/60  Pulse: 74  Temp: 98.4 F (36.9 C)  TempSrc: Oral  SpO2: 96%  Weight: 126 lb 4 oz (57.3 kg)  Height: 5\' 6"  (1.676 m)     Body mass index is 20.38 kg/m.   Physical Exam:    Physical Exam  Constitutional: She appears well-developed. She is cooperative.  Non-toxic appearance. She does not have a sickly appearance. She does not appear ill. No distress.  Cardiovascular: Normal rate, regular rhythm, S1 normal, S2 normal, normal heart sounds and normal pulses.  No LE edema  Pulmonary/Chest: Effort normal and breath sounds normal.  Neurological: She is alert. GCS eye subscore is 4. GCS verbal subscore is 5. GCS motor subscore is 6.  Skin: Skin is warm, dry and intact.  Psychiatric: She has a normal mood and affect. Her speech is normal and behavior is normal.  Pleasant and cheerful  Nursing note and vitals reviewed.    Assessment and Plan:    Caraline was seen today for anxiety and depression.  Diagnoses and all orders for this visit:  Anxiety and Depression, unspecified depression type PHQ-9 and GAD-7 both improved today from last visit. She is doing well on Zoloft 25 mg and her ob-gyn, per patient's report, is agreeable to patient staying on this medication given her ongoing anxiety and depression. I discussed with patient that if they develop any  SI, to tell someone immediately and seek medical attention. Continue talk therapy with Trey Paula. Discussed pre-natal yoga for coping and social support. Follow-up in 6 months, sooner if needed.  Other orders -     sertraline (ZOLOFT) 25 MG tablet; Take 1 tablet (25 mg total) daily by mouth.  . Reviewed expectations re: course of current medical issues. . Discussed self-management of symptoms. . Outlined signs and symptoms indicating need for more acute intervention. . Patient verbalized understanding and all questions were answered. . See orders for this visit as documented in the electronic medical record. . Patient received an After Visit Summary.  CMA or LPN served as scribe during this visit. History, Physical, and Plan performed by medical provider. Documentation and orders reviewed and attested to.  [name], PA-C [place] 12/19/2016  Follow-up: No Follow-up on file.

## 2017-01-07 ENCOUNTER — Ambulatory Visit (INDEPENDENT_AMBULATORY_CARE_PROVIDER_SITE_OTHER): Payer: BLUE CROSS/BLUE SHIELD | Admitting: Psychology

## 2017-01-07 DIAGNOSIS — F330 Major depressive disorder, recurrent, mild: Secondary | ICD-10-CM

## 2017-01-14 ENCOUNTER — Ambulatory Visit: Payer: BLUE CROSS/BLUE SHIELD | Admitting: Psychology

## 2017-01-21 ENCOUNTER — Ambulatory Visit (INDEPENDENT_AMBULATORY_CARE_PROVIDER_SITE_OTHER): Payer: BLUE CROSS/BLUE SHIELD | Admitting: Psychology

## 2017-01-21 DIAGNOSIS — F330 Major depressive disorder, recurrent, mild: Secondary | ICD-10-CM

## 2017-02-05 NOTE — L&D Delivery Note (Signed)
Delivery Note  SVD viable female Apgars 9,9 over 2nd deg MLE. Nuchal x 1 reduced..  Placenta delivered spontaneously intact with 3VC. Repair with 2-0 Chromic with good support and hemostasis noted.  R/V exam confirms.  PH art was sent.   Mother and baby to couplet care and are doing well.  EBL 200cc  Louretta Shorten, MD

## 2017-02-18 ENCOUNTER — Ambulatory Visit: Payer: BLUE CROSS/BLUE SHIELD | Admitting: Psychology

## 2017-03-04 ENCOUNTER — Ambulatory Visit (INDEPENDENT_AMBULATORY_CARE_PROVIDER_SITE_OTHER): Payer: BLUE CROSS/BLUE SHIELD | Admitting: Psychology

## 2017-03-04 DIAGNOSIS — F330 Major depressive disorder, recurrent, mild: Secondary | ICD-10-CM

## 2017-03-19 ENCOUNTER — Encounter: Payer: Self-pay | Admitting: Physician Assistant

## 2017-03-19 ENCOUNTER — Ambulatory Visit (INDEPENDENT_AMBULATORY_CARE_PROVIDER_SITE_OTHER): Payer: BLUE CROSS/BLUE SHIELD | Admitting: Physician Assistant

## 2017-03-19 ENCOUNTER — Ambulatory Visit (INDEPENDENT_AMBULATORY_CARE_PROVIDER_SITE_OTHER): Payer: BLUE CROSS/BLUE SHIELD | Admitting: Sports Medicine

## 2017-03-19 ENCOUNTER — Encounter: Payer: Self-pay | Admitting: Sports Medicine

## 2017-03-19 VITALS — BP 98/56 | HR 82 | Ht 66.0 in | Wt 134.0 lb

## 2017-03-19 VITALS — BP 98/56 | HR 82 | Temp 98.0°F | Resp 16 | Ht 66.0 in | Wt 134.2 lb

## 2017-03-19 DIAGNOSIS — M899 Disorder of bone, unspecified: Secondary | ICD-10-CM | POA: Diagnosis not present

## 2017-03-19 DIAGNOSIS — M9904 Segmental and somatic dysfunction of sacral region: Secondary | ICD-10-CM

## 2017-03-19 DIAGNOSIS — M9905 Segmental and somatic dysfunction of pelvic region: Secondary | ICD-10-CM

## 2017-03-19 DIAGNOSIS — M9902 Segmental and somatic dysfunction of thoracic region: Secondary | ICD-10-CM | POA: Diagnosis not present

## 2017-03-19 DIAGNOSIS — M9903 Segmental and somatic dysfunction of lumbar region: Secondary | ICD-10-CM | POA: Diagnosis not present

## 2017-03-19 DIAGNOSIS — M9901 Segmental and somatic dysfunction of cervical region: Secondary | ICD-10-CM | POA: Diagnosis not present

## 2017-03-19 DIAGNOSIS — M25519 Pain in unspecified shoulder: Secondary | ICD-10-CM | POA: Diagnosis not present

## 2017-03-19 DIAGNOSIS — R55 Syncope and collapse: Secondary | ICD-10-CM

## 2017-03-19 DIAGNOSIS — Z3A22 22 weeks gestation of pregnancy: Secondary | ICD-10-CM | POA: Diagnosis not present

## 2017-03-19 NOTE — Assessment & Plan Note (Signed)
Limited from medication standpoint.  We did discuss prescription for Flexeril and if any lack of improvement I am happy to provide this for her with her understanding that this is a category C medication.

## 2017-03-19 NOTE — Patient Instructions (Signed)

## 2017-03-19 NOTE — Progress Notes (Signed)
Mary Cochran. [place], [place] at [place] - 22 y.o. female MRN [id number]  Date of birth: 1995-05-18  Visit Date: 03/19/2017  PCP: [name], PA   Referred by: [name], [place]   SUBJECTIVE:  Mary Cochran is here for evaluation of Initial Assessment (Neck and shoulder pain)  Patient has a history of low back as well as shoulder pain that has been chronic in nature and intermittently worsens.  Recently worsened with pregnancy.  She does not have any significant radicular symptoms at this time.  Denies any fevers or chills.  She has tried performing some therapeutic exercises including yoga with only minimal short-term relief.  She is unable take medications otherwise heat and ice were only minimally effective.   ROS Denies night time disturbances. Denies fevers, chills, or night sweats. Denies unexplained weight loss. Denies personal history of cancer. Denies changes in bowel or bladder habits. Denies recent unreported falls. Denies new or worsening dyspnea or wheezing. Denies headaches or dizziness.  Denies numbness, tingling or weakness  In the extremities.  Denies dizziness or presyncopal episodes Denies lower extremity edema    No vaginal bleeding.  + quickening, normal  HISTORY & PERTINENT PRIOR DATA:  Prior History reviewed and updated per electronic medical record.  Significant history, findings, studies and interim changes include:  reports that  has never smoked. she has never used smokeless tobacco. No results for input(s): HGBA1C, LABURIC, CREATINE in the last 8760 hours. ** DO PHQ-9 EVERY 3 MONTHS** Problem  [redacted] Weeks Gestation of Pregnancy  Scapular Dysfunction    OBJECTIVE:  VS:  HT:5\' 6"  (167.6 cm)   WT:134 lb (60.8 kg)  BMI:21.64    BP:(!) 98/56  HR:82bpm  TEMP: ( )  RESP:98 %   Estimated Date of Delivery: 07/19/17  -  [redacted]w[redacted]d  PHYSICAL EXAM: Constitutional:  WDWN, Non-toxic appearing. Psychiatric: Alert & appropriately interactive.  Not depressed or anxious appearing. Respiratory: No increased work of breathing.  Trachea Midline Eyes: Pupils are equal.  EOM intact without nystagmus.  No scleral icterus  NEUROVASCULAR exam: No clubbing or cyanosis appreciated No significant venous stasis changes Capillary Refill: normal, less than 2 seconds   Overhead full range of motion of the shoulders.  Neck has normal range of motion as well.  Abdomen is soft and gravid   Otherwise per procedure note  No additional findings.   ASSESSMENT & PLAN:   1. Acute shoulder pain, unspecified laterality   2. [redacted] weeks gestation of pregnancy   3. Somatic dysfunction of cervical region   4. Somatic dysfunction of thoracic region   5. Somatic dysfunction of lumbar region   6. Somatic dysfunction of sacral region   7. Somatic dysfunction of pelvis region   8. Scapular dysfunction    PLAN:    [redacted] weeks gestation of pregnancy Limited from medication standpoint.  We did discuss prescription for Flexeril and if any lack of improvement I am happy to provide this for her with her understanding that this is a category C medication.  Scapular dysfunction She has had long-standing issues with shoulder and lumbar spine issues as well but currently has been doing well.  Given the pregnancy postural changes have occurred and she will benefit from daily exercises these were reviewed per AVS.   ++++++++++++++++++++++++++++++++++++++++++++ Follow-up: Return in about 4 weeks (around 04/16/2017).   Pertinent documentation may be included in additional procedure notes, imaging studies, problem based  documentation and patient instructions. Please see these sections of the encounter for additional information regarding this visit. CMA/ATC served as Education administrator during this visit. History, Physical, and Plan performed by medical provider. Documentation and orders reviewed and attested  to.      Gerda Diss, [facility] Physician

## 2017-03-19 NOTE — Procedures (Signed)
PROCEDURE NOTE : OSTEOPATHIC MANIPULATION The decision today to treat with Osteopathic Manipulative Therapy (OMT) was based on physical exam findings. Verbal consent was obtained following a discussion with the patient regarding the of risks, benefits and potential side effects, including an acute pain flare,post manipulation soreness and need for repeat treatments. Additionally, we specifically discussed the minimal risk of  injury to neurovascular structures associated with Cervical manipulation.   Also discussed the safety regarding pregnancy in regards to cervical in general manipulation.  Care was taken to avoid HVLA to the cranial base Contraindications to OMT reviewed and include: NONE  Manipulation was performed as below: Regions Treated: cervial spine, lumbar spine, thoracic spine, sacrum and pelvis Techniques used: HVLA, muscle energy, myofascial release and Articulatory The patient tolerated the treatment well and reported Improved symptoms following treatment today. Patient was given medications, exercises, stretches and lifestyle modifications per AVS and verbally.     OSTEOPATHIC/STRUCTURAL EXAM FINDINGS:    C2 through C4 rotated left, side bent right  C7 ERS right  T2 FRS right  T4 FRS right  T6 through T8 neutral rotated left, side bent right  L3 FRS right  Right anterior innominate  Left on left sacral torsion

## 2017-03-19 NOTE — Assessment & Plan Note (Signed)
She has had long-standing issues with shoulder and lumbar spine issues as well but currently has been doing well.  Given the pregnancy postural changes have occurred and she will benefit from daily exercises these were reviewed per AVS.

## 2017-03-19 NOTE — Patient Instructions (Signed)
It was great to see you!  Consider compression stockings and stay hydrated. Continue eating high fiber foods. If symptoms persist, consider asking ob at next appointment!   Vasovagal Syncope, Adult Syncope, which is commonly known as fainting or passing out, is a temporary loss of consciousness. It occurs when the blood flow to the brain is reduced. Vasovagal syncope, also called neurocardiogenic syncope, is a fainting spell that happens when blood flow to the brain is reduced because of a sudden drop in heart rate and blood pressure. Vasovagal syncope is usually harmless. However, you can get injured if you fall during a fainting spell. What are the causes? This condition is caused by a drop in heart rate and blood pressure, usually in response to a trigger. Many things and situations can trigger an episode, including:  Pain.  Fear.  The sight of blood. This may occur during medical procedures, such as when blood is being drawn from a vein.  Common activities, such as coughing, swallowing, stretching, or going to the bathroom.  Emotional stress.  Being in a confined space.  Prolonged standing, especially in a warm environment.  Lack of sleep or rest.  Not eating for a long time.  Not drinking enough liquids.  Recent illness.  Drinking alcohol.  Taking drugs that affect blood pressure, such as marijuana, cocaine, opiates, or inhalants.  What are the signs or symptoms? Before a fainting episode, you may:  Feel dizzy or light-headed.  Become pale.  Sense that you are going to faint.  Feel like the room is spinning.  Only see directly ahead (tunnel vision).  Feel sick to your stomach (nauseous).  See spots.  Slowly lose vision.  Hear ringing in your ears.  Have a headache.  Feel warm and sweaty.  Feel a sensation of pins and needles.  During the fainting spell, you may twitch or make jerky movements. Fainting spells usually last no longer than a few  minutes before you wake up. If you get up too quickly before your body can recover, you may faint again. How is this diagnosed? This condition is diagnosed based on your symptoms, your medical history, and a physical exam. Tests may be done to rule out other causes of fainting. Tests may include:  Blood tests.  Heart tests, such as an electrocardiogram (ECG), echocardiogram, or electrophysiology study.  A test to check your response to changes in position (tilt table test).  How is this treated? Usually, treatment is not needed for this condition. Your health care provider may suggest ways to help prevent fainting episodes. These may include:  Drinking additional fluids if you are exposed to a trigger.  Sitting or lying down if you notice signs that an episode is coming.  If your fainting spells continue, your health care provider may recommend that you:  Take medicines to prevent fainting or to help reduce further episodes of fainting.  Do certain exercises.  Wear compression stockings.  Have surgery to place a pacemaker in your body (rare).  Follow these instructions at home:  Learn to identify the signs that an episode is coming.  Sit or lie down at the first sign of a fainting spell. If you sit down, put your head down between your legs. If you lie down, swing your legs up in the air to increase blood flow to the brain.  Avoid hot tubs and saunas.  Avoid standing for a long time. If you have to stand for a long time, try: ? Crossing  your legs. ? Flexing and stretching your leg muscles. ? Squatting. ? Moving your legs. ? Bending over.  Drink enough fluid to keep your urine clear or pale yellow.  Make changes to your diet that your health care provider recommends. You may be told to: ? Avoid caffeine. ? Eat more salt.  Take over-the-counter and prescription medicines only as told by your health care provider. Contact a health care provider if:  You continue to  have fainting spells despite treatment.  You faint more often despite treatment.  You lose consciousness for more than a few minutes.  You faint during or after exercising or after being startled.  You have twitching or jerky movements for longer than a few seconds during a fainting spell.  You have an episode of twitching or jerky movements without fainting. Get help right away if:  A fainting spell leads to an injury or bleeding.  You have new symptoms that occur with the fainting spells, such as: ? Shortness of breath. ? Chest pain. ? Irregular heartbeat.  You twitch or make jerky movements for more than 5 minutes.  You twitch or make jerky movements during more than one fainting spell. This information is not intended to replace advice given to you by your health care provider. Make sure you discuss any questions you have with your health care provider. Document Released: 01/09/2012 Document Revised: 07/06/2015 Document Reviewed: 11/20/2014 Elsevier Interactive Patient Education  [employer].

## 2017-03-19 NOTE — Progress Notes (Addendum)
Mary Cochran is a 22 y.o. female here for a new problem.   History of Present Illness:   Chief Complaint  Patient presents with  . Shoulder Pain    HPI   Patient reports that she has had ongoing shoulder pain during her pregnancy. She typically sleeps on her back and is unable to do this because she is pregnant. So she is therefore sleeping on her side which is causing her to have shoulder pain. She is not taking anything for this. She also has a history of bulging disc in her back, and she has seen Dr. Hulan Saas for manipulations in the past. She reports that her husband has been massaging her shoulders however the pain persists. She cannot get comfortable. She denies fever, chills, bony pain.  Patient also reports that she has been having vasovagal-like symptoms, without syncope, over the past few weeks. It is usually with defecation. She does have a history of IBS. She is unsure if certain foods are causing these symptoms. Her appetite is normal. She reports that she drinks water all day. She reports that she is eating high-fiber foods. However sometimes she does have to strain when using the bathroom. She had these episodes prior to getting pregnant. She also has a significant history of anxiety.   Past Medical History:  Diagnosis Date  . Adenomatous polyp of colon 10/04/2015  . ALLERGIC RHINITIS 12/12/2006  . Allergy    SEASONAL  . Anxiety   . Depression 10/03/2011  . GILBERT'S SYNDROME 03/07/2010  . IBS (irritable bowel syndrome) 10/04/2015  . INSOMNIA-SLEEP DISORDER-UNSPEC 04/18/2007  . Tubular adenoma of colon 10/2014     Social History   Socioeconomic History  . Marital status: Single    Spouse name: Not on file  . Number of children: Not on file  . Years of education: Not on file  . Highest education level: Not on file  Social Needs  . Financial resource strain: Not on file  . Food insecurity - worry: Not on file  . Food insecurity - inability: Not on file  .  Transportation needs - medical: Not on file  . Transportation needs - non-medical: Not on file  Occupational History  . Occupation: Social worker  Tobacco Use  . Smoking status: Never Smoker  . Smokeless tobacco: Never Used  Substance and Sexual Activity  . Alcohol use: No    Alcohol/week: 0.0 oz    Comment: OCC.  . Drug use: No  . Sexual activity: Not on file  Other Topics Concern  . 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 MOUTH SURGERY    . TONSILLECTOMY      Family History  Problem Relation Age of Onset  . Breast cancer Maternal Grandmother   . Heart disease Maternal Grandmother   . Pancreatic cancer Unknown        M . great grandfather  . Lung cancer Paternal Grandfather   . Kidney cancer Maternal Aunt   . Heart disease Father   . Heart disease Maternal Grandfather     Allergies  Allergen Reactions  . Azithromycin   . Hydrocodone     REACTION: GI upset  . Morphine     Current Medications:   Current Outpatient Medications:  .  DICLEGIS 10-10 MG TBEC, Take 2 tablets at bedtime by mouth., Disp: , Rfl:  3 .  Prenatal Vit-Fe Fumarate-FA (PRENATAL VITAMIN PO), Take 1 tablet daily by mouth., Disp: , Rfl:  .  sertraline (ZOLOFT) 25 MG tablet, Take 1 tablet (25 mg total) daily by mouth., Disp: 90 tablet, Rfl: 1   Review of Systems:   ROS  Vitals:   Vitals:   03/19/17 1517  BP: (!) 98/56  Pulse: 82  Resp: 16  Temp: 98 F (36.7 C)  TempSrc: Oral  SpO2: 98%  Weight: 134 lb 3.2 oz (60.9 kg)  Height: 5\' 6"  (1.676 m)     Body mass index is 21.66 kg/m.  Physical Exam:   Physical Exam  Constitutional: She is oriented to person, place, and time. She appears well-developed and well-nourished.  HENT:  Head: Normocephalic and atraumatic.  Eyes: Conjunctivae and EOM are normal.  Neck: Normal range of motion. Neck supple.   Pulmonary/Chest: Effort normal.  Musculoskeletal: Normal range of motion.  No decrease in range of motion. No obvious deformities, rash, ecchymosis. Paraspinal cervical muscle tenderness with deep palpation. No bony tenderness.  Neurological: She is alert and oriented to person, place, and time.  Skin: Skin is warm and dry.  Psychiatric: She has a normal mood and affect. Her behavior is normal. Judgment and thought content normal.    Negative unless otherwise specified per history of present illness   Assessment and Plan:    Lynia was seen today for shoulder pain.  Diagnoses and all orders for this visit:  Acute shoulder pain, unspecified laterality Patient would like to see Dr. Teresa Coombs for manipulation today. He does have availability to schedule and was able to work her in for this. I recommended further treatment per his recommendation. She is agreeable to this.  Vasovagal symptom We discussed adequate hydration, adequate fiber, possible use of compression stockings. Discussed with OB/GYN at next visit for any further recommendations. I also suspect that a level of anxiety is possibly contributing.   . Reviewed expectations re: course of current medical issues. . Discussed self-management of symptoms. . Outlined signs and symptoms indicating need for more acute intervention. . Patient verbalized understanding and all questions were answered. . See orders for this visit as documented in the electronic medical record. . Patient received an After-Visit Summary.   [name], PA-C

## 2017-04-15 ENCOUNTER — Ambulatory Visit: Payer: BLUE CROSS/BLUE SHIELD | Admitting: Psychology

## 2017-04-22 ENCOUNTER — Ambulatory Visit: Payer: BLUE CROSS/BLUE SHIELD | Admitting: Sports Medicine

## 2017-04-22 ENCOUNTER — Ambulatory Visit (INDEPENDENT_AMBULATORY_CARE_PROVIDER_SITE_OTHER): Payer: BLUE CROSS/BLUE SHIELD | Admitting: Sports Medicine

## 2017-04-22 ENCOUNTER — Encounter: Payer: Self-pay | Admitting: Sports Medicine

## 2017-04-22 VITALS — BP 90/52 | HR 91 | Ht 66.0 in | Wt 136.4 lb

## 2017-04-22 DIAGNOSIS — M25519 Pain in unspecified shoulder: Secondary | ICD-10-CM | POA: Diagnosis not present

## 2017-04-22 DIAGNOSIS — M9908 Segmental and somatic dysfunction of rib cage: Secondary | ICD-10-CM

## 2017-04-22 DIAGNOSIS — M545 Low back pain: Secondary | ICD-10-CM

## 2017-04-22 DIAGNOSIS — M9902 Segmental and somatic dysfunction of thoracic region: Secondary | ICD-10-CM | POA: Diagnosis not present

## 2017-04-22 DIAGNOSIS — M9903 Segmental and somatic dysfunction of lumbar region: Secondary | ICD-10-CM | POA: Diagnosis not present

## 2017-04-22 DIAGNOSIS — M542 Cervicalgia: Secondary | ICD-10-CM | POA: Diagnosis not present

## 2017-04-22 DIAGNOSIS — M9901 Segmental and somatic dysfunction of cervical region: Secondary | ICD-10-CM | POA: Diagnosis not present

## 2017-04-22 DIAGNOSIS — M9905 Segmental and somatic dysfunction of pelvic region: Secondary | ICD-10-CM | POA: Diagnosis not present

## 2017-04-22 NOTE — Progress Notes (Signed)
   Mary Cochran. Mary Cochran, [place] at [place] - 22 y.o. female MRN [id number]  Date of birth: [birth year]  Visit Date:   PCP: [name], PA   Referred by: [name], [place]  Please see additional documentation for HPI, review of systems.  HISTORY & PERTINENT PRIOR DATA:  Prior History reviewed and updated per electronic medical record.  Significant history, findings, studies and interim changes include:  reports that  has never smoked. she has never used smokeless tobacco. No results for input(s): HGBA1C, LABURIC, CREATINE in the last 8760 hours. No specialty comments available. No problems updated.  OBJECTIVE:  VS:  HT:5\' 6"  (167.6 cm)   WT:136 lb 6.4 oz (61.9 kg)  BMI:22.03    BP: (!) 90/52   HR:91bpm  TEMP: ( )  RESP:97 %   PHYSICAL EXAM: Constitutional: WDWN, Non-toxic appearing. Psychiatric: Alert & appropriately interactive.  Not depressed or anxious appearing. Respiratory: No increased work of breathing.  Trachea Midline Eyes: Pupils are equal.  EOM intact without nystagmus.  No scleral icterus  NEUROVASCULAR exam: No clubbing or cyanosis appreciated No significant venous stasis changes Capillary Refill: normal, less than 2 seconds  Gravid abdomen.  Full overhead range of motion of the shoulders.  Neck in a held forward position.   ASSESSMENT & PLAN:   1. Somatic dysfunction of cervical region   2. Somatic dysfunction of thoracic region   3. Somatic dysfunction of lumbar region   4. Somatic dysfunction of rib cage region   5. Somatic dysfunction of pelvis region   6. Acute shoulder pain, unspecified laterality    Orders & Meds: No orders of the defined types were placed in this encounter. No orders of the defined types were placed in this encounter.  PLAN: Osteopathic manipulation was performed today based on physical exam findings.  Please see procedure note for further  information including Osteopathic Exam findings.  Home therapeutic exercises reviewed.  Avoidance of NSAIDs reemphasized.   No problem-specific Assessment & Plan notes found for this encounter.  Follow-up: Return in about 4 weeks (around 05/20/2017) for consideration of repeat osteopathic manipulation.

## 2017-04-22 NOTE — Progress Notes (Signed)
PROCEDURE NOTE : OSTEOPATHIC MANIPULATION The decision today to treat with Osteopathic Manipulative Therapy (OMT) was based on physical exam findings. Verbal consent was obtained following a discussion with the patient regarding the of risks, benefits and potential side effects, including an acute pain flare,post manipulation soreness and need for repeat treatments.     relative = pregnancy  Manipulation was performed as below: Regions Treated: cervial spine, lumbar spine, thoracic spine, sacrum and Ribs Techniques used: HVLA, muscle energy, myofascial release and Articulatory   The patient tolerated the treatment well and reported Improved symptoms following treatment today. Patient was given medications, exercises, stretches and lifestyle modifications per AVS and verbally.    OSTEOPATHIC/STRUCTURAL EXAM:   C2 through C4 FRS left C5 FRS right T2 extended side bent right T4 through T6 rotated left L3 FRS right Left on left sacral torsion Posterior right rib 8

## 2017-04-22 NOTE — Progress Notes (Signed)
  Mary Cochran - 22 y.o. female MRN [id number]  Date of birth: 1995/04/10  Scribe for today's visit: Josepha Pigg, CMA     SUBJECTIVE:  Mary Cochran is here for Follow-up (shoulder, neck, and lower back pain)  03/19/17: Patient has a history of low back as well as shoulder pain that has been chronic in nature and intermittently worsens.  Recently worsened with pregnancy.  She does not have any significant radicular symptoms at this time.  Denies any fevers or chills.  She has tried performing some therapeutic exercises including yoga with only minimal short-term relief.  She is unable take medications otherwise heat and ice were only minimally effective.  04/22/17: Compared to the last office visit, her previously described symptoms of neck and shoulder are improving, she continues to have lower back pain. She normally sleeps on her back but d/t pregnancy she has been sleeping on her side and feels that is contributing to her pain.   Current symptoms of LBP are severe & are radiating to R leg. Shoulder and neck pains are moderate-severe depending on the day.  She has been using heat and ice. She received OMT 03/19/16 and reports good effect.    ROS Reports night time disturbances. Denies fevers, chills, or night sweats. Denies unexplained weight loss. Denies personal history of cancer. Reports changes in bowel or bladder habits, increased urination and decreased BM w/ pregnancy. Denies recent unreported falls. Reports new or worsening dyspnea or wheezing. Reports headaches.  Reports numbness, tingling or weakness  In the extremities.  Denies dizziness or presyncopal episodes Denies lower extremity edema      Please see additional documentation for Objective, Assessment and Plan sections. Pertinent additional documentation may be included in corresponding procedure notes, imaging studies, problem based documentation and patient instructions. Please see these sections of the  encounter for additional information regarding this visit.  CMA/ATC served as Education administrator during this visit. History, Physical, and Plan performed by medical provider. Documentation and orders reviewed and attested to.      Gerda Diss, [facility] Physician

## 2017-04-29 ENCOUNTER — Encounter: Payer: Self-pay | Admitting: Sports Medicine

## 2017-05-20 ENCOUNTER — Ambulatory Visit: Payer: BLUE CROSS/BLUE SHIELD | Admitting: Sports Medicine

## 2017-05-27 ENCOUNTER — Ambulatory Visit: Payer: BLUE CROSS/BLUE SHIELD | Admitting: Sports Medicine

## 2017-05-27 ENCOUNTER — Ambulatory Visit: Payer: BLUE CROSS/BLUE SHIELD | Admitting: Psychology

## 2017-06-14 ENCOUNTER — Other Ambulatory Visit: Payer: Self-pay | Admitting: Physician Assistant

## 2017-07-05 ENCOUNTER — Other Ambulatory Visit: Payer: Self-pay

## 2017-07-05 ENCOUNTER — Encounter (HOSPITAL_COMMUNITY): Payer: Self-pay

## 2017-07-05 ENCOUNTER — Inpatient Hospital Stay (HOSPITAL_COMMUNITY)
Admission: AD | Admit: 2017-07-05 | Discharge: 2017-07-05 | Disposition: A | Discharge status: Home / Self Care | Payer: BLUE CROSS/BLUE SHIELD | Source: Ambulatory Visit | Attending: Obstetrics and Gynecology | Admitting: Obstetrics and Gynecology

## 2017-07-05 DIAGNOSIS — Z3689 Encounter for other specified antenatal screening: Secondary | ICD-10-CM

## 2017-07-05 DIAGNOSIS — O479 False labor, unspecified: Secondary | ICD-10-CM | POA: Insufficient documentation

## 2017-07-05 NOTE — Discharge Instructions (Signed)
Braxton Hicks Contractions °Contractions of the uterus can occur throughout pregnancy, but they are not always a sign that you are in labor. You may have practice contractions called Braxton Hicks contractions. These false labor contractions are sometimes confused with true labor. °What are Braxton Hicks contractions? °Braxton Hicks contractions are tightening movements that occur in the muscles of the uterus before labor. Unlike true labor contractions, these contractions do not result in opening (dilation) and thinning of the cervix. Toward the end of pregnancy (32-34 weeks), Braxton Hicks contractions can happen more often and may become stronger. These contractions are sometimes difficult to tell apart from true labor because they can be very uncomfortable. You should not feel embarrassed if you go to the hospital with false labor. °Sometimes, the only way to tell if you are in true labor is for your health care provider to look for changes in the cervix. The health care provider will do a physical exam and may monitor your contractions. If you are not in true labor, the exam should show that your cervix is not dilating and your water has not broken. °If there are other health problems associated with your pregnancy, it is completely safe for you to be sent home with false labor. You may continue to have Braxton Hicks contractions until you go into true labor. °How to tell the difference between true labor and false labor °True labor °· Contractions last 30-70 seconds. °· Contractions become very regular. °· Discomfort is usually felt in the top of the uterus, and it spreads to the lower abdomen and low back. °· Contractions do not go away with walking. °· Contractions usually become more intense and increase in frequency. °· The cervix dilates and gets thinner. °False labor °· Contractions are usually shorter and not as strong as true labor contractions. °· Contractions are usually irregular. °· Contractions  are often felt in the front of the lower abdomen and in the groin. °· Contractions may go away when you walk around or change positions while lying down. °· Contractions get weaker and are shorter-lasting as time goes on. °· The cervix usually does not dilate or become thin. °Follow these instructions at home: °· Take over-the-counter and prescription medicines only as told by your health care provider. °· Keep up with your usual exercises and follow other instructions from your health care provider. °· Eat and drink lightly if you think you are going into labor. °· If Braxton Hicks contractions are making you uncomfortable: °? Change your position from lying down or resting to walking, or change from walking to resting. °? Sit and rest in a tub of warm water. °? Drink enough fluid to keep your urine pale yellow. Dehydration may cause these contractions. °? Do slow and deep breathing several times an hour. °· Keep all follow-up prenatal visits as told by your health care provider. This is important. °Contact a health care provider if: °· You have a fever. °· You have continuous pain in your abdomen. °Get help right away if: °· Your contractions become stronger, more regular, and closer together. °· You have fluid leaking or gushing from your vagina. °· You pass blood-tinged mucus (bloody show). °· You have bleeding from your vagina. °· You have low back pain that you never had before. °· You feel your baby’s head pushing down and causing pelvic pressure. °· Your baby is not moving inside you as much as it used to. °Summary °· Contractions that occur before labor are called Braxton   Hicks contractions, false labor, or practice contractions. °· Braxton Hicks contractions are usually shorter, weaker, farther apart, and less regular than true labor contractions. True labor contractions usually become progressively stronger and regular and they become more frequent. °· Manage discomfort from Braxton Hicks contractions by  changing position, resting in a warm bath, drinking plenty of water, or practicing deep breathing. °This information is not intended to replace advice given to you by your health care provider. Make sure you discuss any questions you have with your health care provider. °Document Released: 06/07/2016 Document Revised: 06/07/2016 Document Reviewed: 06/07/2016 °Elsevier Interactive Patient Education © 2018 [employer] ° °Fetal Movement Counts °Patient Name: ________________________________________________ Patient Due Date: ____________________ °What is a fetal movement count? °A fetal movement count is the number of times that you feel your baby move during a certain amount of time. This may also be called a fetal kick count. A fetal movement count is recommended for every pregnant woman. You may be asked to start counting fetal movements as early as week 28 of your pregnancy. °Pay attention to when your baby is most active. You may notice your baby's sleep and wake cycles. You may also notice things that make your baby move more. You should do a fetal movement count: °· When your baby is normally most active. °· At the same time each day. ° °A good time to count movements is while you are resting, after having something to eat and drink. °How do I count fetal movements? °1. Find a quiet, comfortable area. Sit, or lie down on your side. °2. Write down the date, the start time and stop time, and the number of movements that you felt between those two times. Take this information with you to your health care visits. °3. For 2 hours, count kicks, flutters, swishes, rolls, and jabs. You should feel at least 10 movements during 2 hours. °4. You may stop counting after you have felt 10 movements. °5. If you do not feel 10 movements in 2 hours, have something to eat and drink. Then, keep resting and counting for 1 hour. If you feel at least 4 movements during that hour, you may stop counting. °Contact a health care  provider if: °· You feel fewer than 4 movements in 2 hours. °· Your baby is not moving like he or she usually does. °Date: ____________ Start time: ____________ Stop time: ____________ Movements: ____________ °Date: ____________ Start time: ____________ Stop time: ____________ Movements: ____________ °Date: ____________ Start time: ____________ Stop time: ____________ Movements: ____________ °Date: ____________ Start time: ____________ Stop time: ____________ Movements: ____________ °Date: ____________ Start time: ____________ Stop time: ____________ Movements: ____________ °Date: ____________ Start time: ____________ Stop time: ____________ Movements: ____________ °Date: ____________ Start time: ____________ Stop time: ____________ Movements: ____________ °Date: ____________ Start time: ____________ Stop time: ____________ Movements: ____________ °Date: ____________ Start time: ____________ Stop time: ____________ Movements: ____________ °This information is not intended to replace advice given to you by your health care provider. Make sure you discuss any questions you have with your health care provider. °Document Released: 02/21/2006 Document Revised: 09/21/2015 Document Reviewed: 03/03/2015 °Elsevier Interactive Patient Education © 2018 [employer] ° °

## 2017-07-05 NOTE — Progress Notes (Signed)
NST:  Baseline: 125 bpm, Variability: Good {> 6 bpm), Accelerations: Reactive and Decelerations: Absent  Reactive NST.  RN labor eval. MD unable to enter discharge order. Strip reviewed and dispo entered.   Jorje Guild, NP

## 2017-07-05 NOTE — MAU Note (Signed)
I have communicated with Dr. Corinna Capra and reviewed vital signs:  Vitals:   07/05/17 1412  BP: 119/79  Pulse: 70  Resp: 16  Temp: 98.8 F (37.1 C)    Vaginal exam:  Dilation: 1 Effacement (%): 60, 70 Cervical Position: Posterior Station: -2, -3 Presentation: Vertex Exam by:: B. Shylin Keizer, RN ,   Also reviewed contraction pattern and that non-stress test is reactive.  It has been documented that patient is contracting every 3-5 minutes with minimal cervical change since last OB office visit not indicating active labor.  Patient denies any other complaints.  Based on this report provider has given order for discharge.  A discharge order and diagnosis entered by a provider.   Labor discharge instructions reviewed with patient.

## 2017-07-05 NOTE — MAU Note (Signed)
Started feeling sick a few days ago.  Ctx's every 6-7 min.  Just doesn't feel right. Was 1 cm on Tues.  No bleeding or leaking.  Been having loose stools, nauseated. No problems with preg.

## 2017-07-07 ENCOUNTER — Inpatient Hospital Stay (HOSPITAL_COMMUNITY): Payer: BLUE CROSS/BLUE SHIELD | Admitting: Anesthesiology

## 2017-07-07 ENCOUNTER — Inpatient Hospital Stay (HOSPITAL_COMMUNITY)
Admission: AD | Admit: 2017-07-07 | Discharge: 2017-07-09 | DRG: 807 | Disposition: A | Discharge status: Home / Self Care | Payer: BLUE CROSS/BLUE SHIELD | Attending: Obstetrics and Gynecology | Admitting: Obstetrics and Gynecology

## 2017-07-07 ENCOUNTER — Encounter (HOSPITAL_COMMUNITY): Payer: Self-pay | Admitting: *Deleted

## 2017-07-07 DIAGNOSIS — O4292 Full-term premature rupture of membranes, unspecified as to length of time between rupture and onset of labor: Secondary | ICD-10-CM | POA: Diagnosis present

## 2017-07-07 DIAGNOSIS — Z3A38 38 weeks gestation of pregnancy: Secondary | ICD-10-CM | POA: Diagnosis not present

## 2017-07-07 HISTORY — DX: Headache: R51

## 2017-07-07 HISTORY — DX: Headache, unspecified: R51.9

## 2017-07-07 LAB — CBC
HEMATOCRIT: 37.9 % (ref 36.0–46.0)
HEMOGLOBIN: 13.1 g/dL (ref 12.0–15.0)
MCH: 32.3 pg (ref 26.0–34.0)
MCHC: 34.6 g/dL (ref 30.0–36.0)
MCV: 93.6 fL (ref 78.0–100.0)
Platelets: 238 10*3/uL (ref 150–400)
RBC: 4.05 MIL/uL (ref 3.87–5.11)
RDW: 13.3 % (ref 11.5–15.5)
WBC: 10 10*3/uL (ref 4.0–10.5)

## 2017-07-07 LAB — POCT FERN TEST: POCT Fern Test: POSITIVE

## 2017-07-07 LAB — RPR: RPR Ser Ql: NONREACTIVE

## 2017-07-07 MED ORDER — LACTATED RINGERS IV SOLN
500.0000 mL | INTRAVENOUS | Status: DC | PRN
  Administered 2017-07-07 (×2): 1000 mL via INTRAVENOUS

## 2017-07-07 MED ORDER — DIPHENHYDRAMINE HCL 25 MG PO CAPS: 25.0000 mg | ORAL_CAPSULE | Freq: Four times a day (QID) | ORAL | Status: DC | PRN

## 2017-07-07 MED ORDER — TETANUS-DIPHTH-ACELL PERTUSSIS 5-2.5-18.5 LF-MCG/0.5 IM SUSP: 0.5000 mL | Freq: Once | INTRAMUSCULAR | Status: DC

## 2017-07-07 MED ORDER — LACTATED RINGERS IV SOLN
500.0000 mL | Freq: Once | INTRAVENOUS | Status: AC
  Administered 2017-07-07: 500 mL via INTRAVENOUS

## 2017-07-07 MED ORDER — OXYTOCIN 40 UNITS IN LACTATED RINGERS INFUSION - SIMPLE MED
1.0000 m[IU]/min | INTRAVENOUS | Status: DC
  Administered 2017-07-07: 2 m[IU]/min via INTRAVENOUS

## 2017-07-07 MED ORDER — PRENATAL MULTIVITAMIN CH
1.0000 | ORAL_TABLET | Freq: Every day | ORAL | Status: DC
  Administered 2017-07-08: 1 via ORAL
  Filled 2017-07-07: qty 1

## 2017-07-07 MED ORDER — OXYTOCIN BOLUS FROM INFUSION
500.0000 mL | Freq: Once | INTRAVENOUS | Status: AC
  Administered 2017-07-07: 500 mL via INTRAVENOUS

## 2017-07-07 MED ORDER — ACETAMINOPHEN 325 MG PO TABS
650.0000 mg | ORAL_TABLET | ORAL | Status: DC | PRN
  Administered 2017-07-09 (×2): 650 mg via ORAL
  Filled 2017-07-07 (×2): qty 2

## 2017-07-07 MED ORDER — LIDOCAINE HCL (PF) 1 % IJ SOLN
30.0000 mL | INTRAMUSCULAR | Status: DC | PRN
  Filled 2017-07-07: qty 30

## 2017-07-07 MED ORDER — ACETAMINOPHEN 325 MG PO TABS: 650.0000 mg | ORAL_TABLET | ORAL | Status: DC | PRN

## 2017-07-07 MED ORDER — DIBUCAINE 1 % RE OINT: 1.0000 "application " | TOPICAL_OINTMENT | RECTAL | Status: DC | PRN

## 2017-07-07 MED ORDER — WITCH HAZEL-GLYCERIN EX PADS: 1.0000 "application " | MEDICATED_PAD | CUTANEOUS | Status: DC | PRN

## 2017-07-07 MED ORDER — SOD CITRATE-CITRIC ACID 500-334 MG/5ML PO SOLN: 30.0000 mL | ORAL | Status: DC | PRN

## 2017-07-07 MED ORDER — IBUPROFEN 600 MG PO TABS
600.0000 mg | ORAL_TABLET | Freq: Four times a day (QID) | ORAL | Status: DC
  Administered 2017-07-07 – 2017-07-09 (×8): 600 mg via ORAL
  Filled 2017-07-07 (×8): qty 1

## 2017-07-07 MED ORDER — EPHEDRINE 5 MG/ML INJ
10.0000 mg | INTRAVENOUS | Status: DC | PRN
  Filled 2017-07-07: qty 2

## 2017-07-07 MED ORDER — PHENYLEPHRINE 40 MCG/ML (10ML) SYRINGE FOR IV PUSH (FOR BLOOD PRESSURE SUPPORT)
80.0000 ug | PREFILLED_SYRINGE | INTRAVENOUS | Status: DC | PRN
  Filled 2017-07-07: qty 5
  Filled 2017-07-07: qty 10

## 2017-07-07 MED ORDER — SERTRALINE HCL 25 MG PO TABS
25.0000 mg | ORAL_TABLET | Freq: Every day | ORAL | Status: DC
  Administered 2017-07-07 – 2017-07-09 (×3): 25 mg via ORAL
  Filled 2017-07-07 (×4): qty 1

## 2017-07-07 MED ORDER — FENTANYL 2.5 MCG/ML BUPIVACAINE 1/10 % EPIDURAL INFUSION (WH - ANES)
14.0000 mL/h | INTRAMUSCULAR | Status: DC | PRN
  Administered 2017-07-07: 14 mL/h via EPIDURAL
  Filled 2017-07-07: qty 100

## 2017-07-07 MED ORDER — ONDANSETRON HCL 4 MG/2ML IJ SOLN: 4.0000 mg | Freq: Four times a day (QID) | INTRAMUSCULAR | Status: DC | PRN

## 2017-07-07 MED ORDER — LIDOCAINE HCL (PF) 1 % IJ SOLN
INTRAMUSCULAR | Status: DC | PRN
  Administered 2017-07-07 (×2): 4 mL via EPIDURAL

## 2017-07-07 MED ORDER — LACTATED RINGERS IV SOLN
INTRAVENOUS | Status: DC
  Administered 2017-07-07 (×2): via INTRAVENOUS

## 2017-07-07 MED ORDER — ONDANSETRON HCL 4 MG PO TABS: 4.0000 mg | ORAL_TABLET | ORAL | Status: DC | PRN

## 2017-07-07 MED ORDER — COCONUT OIL OIL
1.0000 "application " | TOPICAL_OIL | Status: DC | PRN
  Administered 2017-07-09: 1 via TOPICAL
  Filled 2017-07-07: qty 120

## 2017-07-07 MED ORDER — MEASLES, MUMPS & RUBELLA VAC ~~LOC~~ INJ
0.5000 mL | INJECTION | Freq: Once | SUBCUTANEOUS | Status: DC
  Filled 2017-07-07: qty 0.5

## 2017-07-07 MED ORDER — SENNOSIDES-DOCUSATE SODIUM 8.6-50 MG PO TABS
2.0000 | ORAL_TABLET | ORAL | Status: DC
  Administered 2017-07-07 – 2017-07-08 (×2): 2 via ORAL
  Filled 2017-07-07 (×2): qty 2

## 2017-07-07 MED ORDER — OXYCODONE-ACETAMINOPHEN 5-325 MG PO TABS: 2.0000 | ORAL_TABLET | ORAL | Status: DC | PRN

## 2017-07-07 MED ORDER — ONDANSETRON HCL 4 MG/2ML IJ SOLN: 4.0000 mg | INTRAMUSCULAR | Status: DC | PRN

## 2017-07-07 MED ORDER — BENZOCAINE-MENTHOL 20-0.5 % EX AERO
1.0000 "application " | INHALATION_SPRAY | CUTANEOUS | Status: DC | PRN
  Administered 2017-07-08: 1 via TOPICAL
  Filled 2017-07-07: qty 56

## 2017-07-07 MED ORDER — OXYTOCIN 40 UNITS IN LACTATED RINGERS INFUSION - SIMPLE MED: 2.5000 [IU]/h | INTRAVENOUS | Status: DC

## 2017-07-07 MED ORDER — DIPHENHYDRAMINE HCL 50 MG/ML IJ SOLN: 12.5000 mg | INTRAMUSCULAR | Status: DC | PRN

## 2017-07-07 MED ORDER — FENTANYL 2.5 MCG/ML BUPIVACAINE 1/10 % EPIDURAL INFUSION (WH - ANES): 14.0000 mL/h | INTRAMUSCULAR | Status: DC | PRN

## 2017-07-07 MED ORDER — FLEET ENEMA 7-19 GM/118ML RE ENEM: 1.0000 | ENEMA | RECTAL | Status: DC | PRN

## 2017-07-07 MED ORDER — SIMETHICONE 80 MG PO CHEW: 80.0000 mg | CHEWABLE_TABLET | ORAL | Status: DC | PRN

## 2017-07-07 MED ORDER — MEDROXYPROGESTERONE ACETATE 150 MG/ML IM SUSP: 150.0000 mg | INTRAMUSCULAR | Status: DC | PRN

## 2017-07-07 MED ORDER — OXYCODONE-ACETAMINOPHEN 5-325 MG PO TABS: 1.0000 | ORAL_TABLET | ORAL | Status: DC | PRN

## 2017-07-07 MED ORDER — PHENYLEPHRINE 40 MCG/ML (10ML) SYRINGE FOR IV PUSH (FOR BLOOD PRESSURE SUPPORT)
80.0000 ug | PREFILLED_SYRINGE | INTRAVENOUS | Status: DC | PRN
  Filled 2017-07-07: qty 5

## 2017-07-07 MED ORDER — ZOLPIDEM TARTRATE 5 MG PO TABS: 5.0000 mg | ORAL_TABLET | Freq: Every evening | ORAL | Status: DC | PRN

## 2017-07-07 NOTE — Lactation Note (Signed)
This note was copied from a baby's chart. Lactation Consultation Note  Patient Name: Mary Cochran [id number]'V Date: 07/07/2017 Reason for consult: Initial assessment;1st time breastfeeding;Primapara;Early term 37-38.6wks  P1 mother whose infant is now 97 hours old.  Baby is sleeping in bassinet as I arrived.   She is not showing any feeding cues at this time.  Reviewed with parents feeding cues, feeding 8-12 times/24 hours or earlier if she shows feeding cues, STS, breast massage and hand expression.  Mother is very knowledgeable about breastfeeding and it is obvious she has read a lot.  Parents also have taken a breastfeeding class.    Mother feels like baby has been latching well so far and parents have no questions/concerns at this time.  Her breasts are soft and non tender with everted nipples.  Mother has no breakdown or soreness to nipples.  Encouraged her to have RN/LC observe a latch approximately every shift.  Mom made aware of O/P services, breastfeeding support groups, community resources, and our phone # for post-discharge questions. Mother will call for assistance as needed.  Father present and supportive.   Maternal Data Formula Feeding for Exclusion: No Has patient been taught Hand Expression?: Yes Does the patient have breastfeeding experience prior to this delivery?: No  Feeding    LATCH Score                   Interventions    Lactation Tools Discussed/Used WIC Program: No   Consult Status Consult Status: Follow-up Date: 07/08/17 Follow-up type: In-patient    Elvenia Godden R Laurana Magistro 07/07/2017, 10:04 PM

## 2017-07-07 NOTE — Progress Notes (Signed)
To 164 via w/c

## 2017-07-07 NOTE — Progress Notes (Signed)
Pt able to ambulate to BR with stand by assist but on toilet pt lost consciousness.  Pt assisted on steady via multiple nurses and back to bed. BP wnl, IV bolus started and juice given. Pt states she's starting to feel better.

## 2017-07-07 NOTE — Anesthesia Preprocedure Evaluation (Signed)
Anesthesia Evaluation  Patient identified by MRN, date of birth, ID band Patient awake    Reviewed: Allergy & Precautions, NPO status , Patient's Chart, lab work & pertinent test results  Airway Mallampati: II  TM Distance: >3 FB Neck ROM: Full    Dental no notable dental hx.    Pulmonary neg pulmonary ROS,    Pulmonary exam normal breath sounds clear to auscultation       Cardiovascular negative cardio ROS Normal cardiovascular exam Rhythm:Regular Rate:Normal     Neuro/Psych  Headaches, PSYCHIATRIC DISORDERS Anxiety Depression    GI/Hepatic negative GI ROS, Neg liver ROS,   Endo/Other  negative endocrine ROS  Renal/GU negative Renal ROS     Musculoskeletal negative musculoskeletal ROS (+)   Abdominal   Peds  Hematology negative hematology ROS (+)   Anesthesia Other Findings   Reproductive/Obstetrics (+) Pregnancy                             Anesthesia Physical Anesthesia Plan  ASA: II  Anesthesia Plan: Epidural   Post-op Pain Management:    Induction:   PONV Risk Score and Plan:   Airway Management Planned:   Additional Equipment:   Intra-op Plan:   Post-operative Plan:   Informed Consent: I have reviewed the patients History and Physical, chart, labs and discussed the procedure including the risks, benefits and alternatives for the proposed anesthesia with the patient or authorized representative who has indicated his/her understanding and acceptance.     Plan Discussed with:   Anesthesia Plan Comments:         Anesthesia Quick Evaluation

## 2017-07-07 NOTE — Anesthesia Procedure Notes (Signed)
Epidural Patient location during procedure: OB Start time: 07/07/2017 8:57 AM End time: 07/07/2017 9:07 AM  Staffing Anesthesiologist: Nolon Nations, MD Performed: anesthesiologist   Preanesthetic Checklist Completed: patient identified, pre-op evaluation, timeout performed, IV checked, risks and benefits discussed and monitors and equipment checked  Epidural Patient position: sitting Prep: site prepped and draped and DuraPrep Patient monitoring: heart rate, continuous pulse ox and blood pressure Approach: midline Location: L3-L4 Injection technique: LOR air and LOR saline  Needle:  Needle type: Tuohy  Needle gauge: 17 G Needle length: 9 cm Needle insertion depth: 5 cm Catheter type: closed end flexible Catheter size: 19 Gauge Test dose: negative  Assessment Sensory level: T8 Events: blood not aspirated, injection not painful, no injection resistance, negative IV test and no paresthesia  Additional Notes Reason for block:procedure for pain

## 2017-07-07 NOTE — Progress Notes (Signed)
Dr Corinna Capra notified of SROM with cl fld, ctx pattern, sve. Will admit to [facility]

## 2017-07-07 NOTE — H&P (Signed)
Mary Cochran is a 22 y.o. female presenting for PROM  0130 clear fluid and early labor sxs.  Pregnancy uncomplicated.  GBS-. OB History    Gravida  1   Para      Term      Preterm      AB      Living        SAB      TAB      Ectopic      Multiple      Live Births             Past Medical History:  Diagnosis Date  . Adenomatous polyp of colon 10/04/2015  . ALLERGIC RHINITIS 12/12/2006  . Allergy    SEASONAL  . Anxiety   . Depression 10/03/2011  . GILBERT'S SYNDROME 03/07/2010  . Headache    Migraines  . IBS (irritable bowel syndrome) 10/04/2015  . INSOMNIA-SLEEP DISORDER-UNSPEC 04/18/2007  . Tubular adenoma of colon 10/2014   Past Surgical History:  Procedure Laterality Date  . MOUTH SURGERY    . TONSILLECTOMY     Family History: family history includes Breast cancer in her maternal grandmother; Heart disease in her father, maternal grandfather, and maternal grandmother; Kidney cancer in her maternal aunt; Miscarriages / Stillbirths in her maternal grandmother; Pancreatic cancer in her unknown relative; Varicose Veins in her maternal grandfather and mother. Social History:  reports that she has never smoked. She has never used smokeless tobacco. She reports that she does not drink alcohol or use drugs.     Maternal Diabetes: No Genetic Screening: Normal Maternal Ultrasounds/Referrals: Normal Fetal Ultrasounds or other Referrals:  None Maternal Substance Abuse:  No Significant Maternal Medications:  None Significant Maternal Lab Results:  None Other Comments:  None  ROS History Dilation: 1 Effacement (%): 80 Station: -2 Exam by:: Blima Singer rNC Blood pressure 104/66, pulse 83, temperature 98.6 F (37 C), temperature source Oral, resp. rate 16, height 5\' 6"  (1.676 m), weight 143 lb (64.9 kg), last menstrual period 10/06/2016. Exam Physical Exam  Prenatal labs: ABO, Rh: --/--/O NEG (06/02 0353) Antibody: POS (06/02 0353) Rubella:   RPR:     HBsAg:    HIV:    GBS:     Assessment/Plan: IUP at term PROM Pit augmentation Anticipate SVD   Laberta Wilbon C 07/07/2017, 7:57 AM

## 2017-07-08 ENCOUNTER — Other Ambulatory Visit: Payer: Self-pay

## 2017-07-08 LAB — CBC
HCT: 32.2 % — ABNORMAL LOW (ref 36.0–46.0)
Hemoglobin: 10.8 g/dL — ABNORMAL LOW (ref 12.0–15.0)
MCH: 32 pg (ref 26.0–34.0)
MCHC: 33.5 g/dL (ref 30.0–36.0)
MCV: 95.3 fL (ref 78.0–100.0)
PLATELETS: 199 10*3/uL (ref 150–400)
RBC: 3.38 MIL/uL — ABNORMAL LOW (ref 3.87–5.11)
RDW: 13.6 % (ref 11.5–15.5)
WBC: 12.1 10*3/uL — ABNORMAL HIGH (ref 4.0–10.5)

## 2017-07-08 NOTE — Progress Notes (Signed)
MOB was referred for history of depression/anxiety. * Referral screened out by Clinical Social Worker because none of the following criteria appear to apply: ~ History of anxiety/depression during this pregnancy, or of post-partum depression. ~ Diagnosis of anxiety and/or depression within last 3 years OR * MOB's symptoms currently being treated with medication and/or therapy. Please contact the Clinical Social Worker if needs arise, by [facility] request, or if MOB scores greater than 9/yes to question 10 on Edinburgh Postpartum Depression Screen.  MOB has Rx for Zoloft.

## 2017-07-08 NOTE — Progress Notes (Signed)
Patient ambulated well to sink, brushed teeth and washed face.  Took out catheter at toilet.  Patient began to have BM and became faint. Used ammonia, patient was alert. Used steady to get her back to bed.  Notified Dr. Helane Rima. Will continue to monitor activity closely

## 2017-07-08 NOTE — Progress Notes (Signed)
Patient is doing well. Foley catheter is still in? I have advised the nurse to remove the catheter this morning.  BP 103/67 (BP Location: Left Arm)   Pulse 67   Temp 98.2 F (36.8 C) (Oral)   Resp 16   Ht 5\' 6"  (1.676 m)   Wt 64.9 kg (143 lb)   LMP 10/06/2016   SpO2 99%   Breastfeeding? Unknown   BMI 23.08 kg/m  Abdomen is soft and non tender Perineum healing well No edema  Results for orders placed or performed during the hospital encounter of 07/07/17 (from the past 24 hour(s))  CBC     Status: Abnormal   Collection Time: 07/08/17  5:32 AM  Result Value Ref Range   WBC 12.1 (H) 4.0 - 10.5 K/uL   RBC 3.38 (L) 3.87 - 5.11 MIL/uL   Hemoglobin 10.8 (L) 12.0 - 15.0 g/dL   HCT 32.2 (L) 36.0 - 46.0 %   MCV 95.3 78.0 - 100.0 fL   MCH 32.0 26.0 - 34.0 pg   MCHC 33.5 30.0 - 36.0 g/dL   RDW 13.6 11.5 - 15.5 %   Platelets 199 150 - 400 K/uL   Impression:  PPD # 1   PLAN Remove catheter Routine care Discharge home tomorrow

## 2017-07-08 NOTE — Anesthesia Postprocedure Evaluation (Signed)
Anesthesia Post Note  Patient: Mary Cochran  Procedure(s) Performed: AN AD Angie     Patient location during evaluation: Mother Baby Anesthesia Type: Epidural Level of consciousness: awake and alert Pain management: pain level controlled Vital Signs Assessment: post-procedure vital signs reviewed and stable Respiratory status: spontaneous breathing, nonlabored ventilation and respiratory function stable Cardiovascular status: stable Postop Assessment: no headache, no backache and epidural receding Anesthetic complications: no    Last Vitals:  Vitals:   07/08/17 0536 07/08/17 0623  BP:  103/67  Pulse:  67  Resp:  16  Temp:  36.8 C  SpO2: 99% 99%    Last Pain:  Vitals:   07/08/17 0830  TempSrc:   PainSc: 1    Pain Goal:                 [employer]

## 2017-07-09 MED ORDER — IBUPROFEN 600 MG PO TABS: 600.0000 mg | ORAL_TABLET | Freq: Four times a day (QID) | ORAL | 0 refills | Status: DC | PRN

## 2017-07-09 MED ORDER — ACETAMINOPHEN 325 MG PO TABS: 650.0000 mg | ORAL_TABLET | Freq: Four times a day (QID) | ORAL | 0 refills | Status: DC | PRN

## 2017-07-09 NOTE — Lactation Note (Addendum)
This note was copied from a baby's chart. Lactation Consultation Note  Patient Name: Mary Cochran Date: 07/09/2017 Reason for consult: Follow-up assessment   P1, Baby 32 hours old.  < 6 lbs. 7.1% weight loss. Some stools have been green.  Parents state baby went for 6 hours without feeding yesterday.  Bilirubin being evaluated.  Provided education regarding feeding behavior of 5 lb baby.  Discussed setting an alarm to wake baby and feed and give her supplemental breastmilk if she will not latch.  Parents seem conscientious.  Observed feeding for 15 min with frequent swallows. Mother had expressed good flow of colostrum before latching which was spoon fed to infant. Mother's nipples have abrasions on tips.  She has been applying ebm and started using coconut oil. Provided mother w/ comfort gels to alternate with. Mom encouraged to feed baby 8-12 times/24 hours and with feeding cues at least q 3 hours.  Suggest if baby becomes sleepy and will not sustain latch mother will need to start pumping and giving volume back to baby. Reviewed engorgement care and monitoring voids/stools.      Maternal Data Has patient been taught Hand Expression?: Yes  Feeding Feeding Type: Breast Fed Length of feed: 15 min  LATCH Score Latch: Grasps breast easily, tongue down, lips flanged, rhythmical sucking.  Audible Swallowing: Spontaneous and intermittent  Type of Nipple: Everted at rest and after stimulation  Comfort (Breast/Nipple): Filling, red/small blisters or bruises, mild/mod discomfort  Hold (Positioning): No assistance needed to correctly position infant at breast.  LATCH Score: 9  Interventions Interventions: Breast feeding basics reviewed;Hand express;Comfort gels  Lactation Tools Discussed/Used     Consult Status Consult Status: Complete    Carlye Grippe 07/09/2017, 11:21 AM

## 2017-07-09 NOTE — Discharge Summary (Signed)
Obstetric Discharge Summary Reason for Admission: rupture of membranes Prenatal Procedures: none Intrapartum Procedures: spontaneous vaginal delivery Postpartum Procedures: none Complications-Operative and Postpartum: none Hemoglobin  Date Value Ref Range Status  07/08/2017 10.8 (L) 12.0 - 15.0 g/dL Final   HCT  Date Value Ref Range Status  07/08/2017 32.2 (L) 36.0 - 46.0 % Final    Physical Exam:  General: alert, cooperative and no distress Lochia: appropriate Uterine Fundus: firm Incision: healing well DVT Evaluation: No evidence of DVT seen on physical exam.  Discharge Diagnoses: Term Pregnancy-delivered  Discharge Information: Date: 07/09/2017 Activity: pelvic rest Diet: routine Medications: PNV and Ibuprofen Condition: stable Instructions: refer to practice specific booklet Discharge to: home   Newborn Data: Live born female  Birth Weight: 6 lb 3.1 oz (2810 g) APGAR: 9, 9  Newborn Delivery   Birth date/time:  07/07/2017 13:08:00 Delivery type:  Vaginal, Spontaneous     Home with mother.  Shon Millet II 07/09/2017, 7:21 AM

## 2017-07-10 ENCOUNTER — Ambulatory Visit: Payer: Self-pay

## 2017-07-10 NOTE — Lactation Note (Signed)
This note was copied from a baby's chart. Lactation Consultation Note: infant 52 hours old and was on double photo tx. Infant now off , but mother reports infant is still sleepy. Mother reports that infant is feeding well, mother reports that she had several tiny blisters on her nipples but are healed now. Mother reports that she is breastfeeding and then supplementing using a curved tip syringe. Mother last pumped 30 ml . Infant was given 31ml of ebm with a curved tip syringe and finger.  Mother has a DEBP at home. Mother advised to continue to post pump after feedings for 15 mins . She was advised to continue to do frequent skin to skin and cue base feeding. Advised to feed infant at least 8-12 times in 24 hours. Mother to continue to supplement infant after breastfeeding. Parents were given instructions on methods of supplementing at the breast using a #5 french feeding tube and or cup feeding. Discussed cluster feeding. Discussed treatment and prevention of engorgement. Mother was given a harmony hand pump with instructions.  Mother has comfort gels. She was advised to continue to hand express frequently.  Recommend that mother follow up with [facility] for outpatient visit. Discussed coming to [facility]. Mother receptive to all teaching.   Patient Name: Mary Cochran'P Date: 07/10/2017 Reason for consult: Follow-up assessment   Maternal Data    Feeding Feeding Type: Breast Milk Length of feed: 10 min  LATCH Score                   Interventions Interventions: Skin to skin;Breast massage;Breast compression;Position options;Expressed milk;Comfort gels;Hand pump;DEBP;Ice  Lactation Tools Discussed/Used     Consult Status Consult Status: Complete    Darla Lesches 07/10/2017, 11:45 AM

## 2017-07-11 LAB — TYPE AND SCREEN
ABO/RH(D): O NEG
Antibody Screen: POSITIVE
UNIT DIVISION: 0
Unit division: 0

## 2017-07-11 LAB — BPAM RBC
BLOOD PRODUCT EXPIRATION DATE: 201906272359
BLOOD PRODUCT EXPIRATION DATE: 201906272359
UNIT TYPE AND RH: 9500
Unit Type and Rh: 9500

## 2017-08-16 ENCOUNTER — Ambulatory Visit: Payer: Self-pay

## 2017-08-16 NOTE — Telephone Encounter (Signed)
See note

## 2017-08-16 NOTE — Telephone Encounter (Signed)
Patient call in with c/o "dizziness and nausea." She says "I went to the UC yesterday and I had blood work drawn, which I haven't received the results. They told me I may have a virus. I have been taking dramamine and ginger for the dizziness, but the UC mentioned about my breast milk drying up. Is there anything you can recommend for me to take OTC to help with my dizziness?" I advised the patient that she can be seen in the office tomorrow at [facility] for her dizziness. She says "I will wait until 6 pm and give the UC time to call me with my lab results. If I feel like I need to come in tomorrow, can I just call in the morning or is it a walk-in clinic?" I advised that the office will be closed and the only way to be seen is to schedule an appointment today. She says "I will just wait to see how I feel on Monday and if I am still having the dizziness, I will call and make an appointment." I advised her to call her local pharmacist to ask what medication OTC is safe to take while breastfeeding for dizziness, nausea. She verbalized understanding.   Reason for Disposition . Health Information question, no triage required and triager able to answer question  Answer Assessment - Initial Assessment Questions 1. REASON FOR CALL or QUESTION: "What is your reason for calling today?" or "How can I best help you?" or "What question do you have that I can help answer?"     I want to know what can I take for dizziness  Protocols used: INFORMATION ONLY CALL-A-AH

## 2017-08-19 NOTE — Telephone Encounter (Signed)
Spoke to pt, told her following up on Triage call from Friday. Asked pt if she is feeling better anymore dizziness? Pt said she is feeling better and dizziness is gone. Asked pt if she got anything OTC to take? Pt said no due to breastfeeding just took Ibuprofen and a little Sudafed. Told pt okay I am glad you are feeling better. Also told pt due for annual exam please schedule at your convience.  Pt verbalized understanding and will call back and schedule.

## 2017-12-18 ENCOUNTER — Other Ambulatory Visit: Payer: Self-pay | Admitting: Physician Assistant

## 2017-12-23 ENCOUNTER — Other Ambulatory Visit: Payer: Self-pay | Admitting: Physician Assistant

## 2017-12-25 ENCOUNTER — Encounter: Payer: Self-pay | Admitting: Physician Assistant

## 2017-12-25 ENCOUNTER — Ambulatory Visit (INDEPENDENT_AMBULATORY_CARE_PROVIDER_SITE_OTHER): Payer: BLUE CROSS/BLUE SHIELD | Admitting: Physician Assistant

## 2017-12-25 VITALS — BP 100/56 | HR 77 | Temp 98.8°F | Ht 66.0 in | Wt 123.0 lb

## 2017-12-25 DIAGNOSIS — F419 Anxiety disorder, unspecified: Secondary | ICD-10-CM | POA: Diagnosis not present

## 2017-12-25 MED ORDER — SERTRALINE HCL 25 MG PO TABS: 25.0000 mg | ORAL_TABLET | Freq: Every day | ORAL | 3 refills | Status: DC

## 2017-12-25 NOTE — Patient Instructions (Signed)
Congrats!!!!  Let us know if you need anything :)

## 2017-12-25 NOTE — Progress Notes (Signed)
Mary Cochran is a 22 y.o. female is here to discuss: Anxiety  I acted as a Education administrator for [employer], PA-C Anselmo Pickler, LPN  History of Present Illness:   Chief Complaint  Patient presents with  . Anxiety  . Depression    Anxiety  Presents for follow-up visit. Symptoms include irritability, nausea (Pt pregnant) and nervous/anxious behavior. Patient reports no chest pain, decreased concentration, depressed mood, dizziness, dry mouth, excessive worry, insomnia, malaise, palpitations, panic, restlessness, shortness of breath or suicidal ideas. Symptoms occur most days. The severity of symptoms is mild. The quality of sleep is good. Nighttime awakenings: several.   Compliance with medications is 76-100%. Treatment side effects: Pt tolerating medication well.  Depression         This is a chronic problem.  The problem occurs daily (but some days worse than others).  The most recent episode lasted 1 day.    The problem has been waxing and waning since onset.  Associated symptoms include fatigue, headaches and indigestion.  Associated symptoms include no decreased concentration, no helplessness, no hopelessness, does not have insomnia, no restlessness, no decreased interest, no appetite change, no body aches, not sad and no suicidal ideas.     The symptoms are aggravated by family issues.  Compliance with treatment is good.  Previous treatment provided mild relief.  Past medical history includes anxiety.    Patient has a 76 month old. She just found out that she is pregnant again!  Edinburgh Postnatal Depression Scale - 12/25/17 1404      Edinburgh Postnatal Depression Scale:  In the Past 7 Days   I have been able to laugh and see the funny side of things.  2    I have looked forward with enjoyment to things.  0    I have blamed myself unnecessarily when things went wrong.  1    I have been anxious or worried for no good reason.  2    I have felt scared or panicky for no good  reason.  2    Things have been getting on top of me.  2    I have been so unhappy that I have had difficulty sleeping.  2    I have felt sad or miserable.  2    I have been so unhappy that I have been crying.  1    The thought of harming myself has occurred to me.  0    Edinburgh Postnatal Depression Scale Total  14      Depression screen [facility] 2/9 12/19/2016 11/05/2016 07/16/2016 10/04/2015  Decreased Interest 1 2 1  0  Down, Depressed, Hopeless 1 1 1  0  PHQ - 2 Score 2 3 2  0  Altered sleeping 1 1 1  -  Tired, decreased energy 1 1 1  -  Change in appetite 1 1 0 -  Feeling bad or failure about yourself  1 1 1  -  Trouble concentrating 1 2 0 -  Moving slowly or fidgety/restless 0 1 0 -  Suicidal thoughts 0 0 0 -  PHQ-9 Score 7 10 5  -  Difficult doing work/chores Somewhat difficult Somewhat difficult - -   GAD 7 : Generalized Anxiety Score 12/25/2017 12/19/2016 11/05/2016 07/16/2016  Nervous, Anxious, on Edge 2 1 2 1   Control/stop worrying 2 1 1 1   Worry too much - different things 2 1 1 2   Trouble relaxing 2 1 1 1   Restless 1 0 0 1  Easily annoyed or  irritable 2 1 2 1   Afraid - awful might happen 2 0 1 1  Total GAD 7 Score 13 5 8 8   Anxiety Difficulty Somewhat difficult Somewhat difficult - -      There are no preventive care reminders to display for this patient.  Past Medical History:  Diagnosis Date  . Adenomatous polyp of colon 10/04/2015  . ALLERGIC RHINITIS 12/12/2006  . Allergy    SEASONAL  . Anxiety   . Depression 10/03/2011  . GILBERT'S SYNDROME 03/07/2010  . Headache    Migraines  . IBS (irritable bowel syndrome) 10/04/2015  . INSOMNIA-SLEEP DISORDER-UNSPEC 04/18/2007  . Tubular adenoma of colon 10/2014     Social History   Socioeconomic History  . Marital status: Married    Spouse name: Not on file  . Number of children: Not on file  . Years of education: Not on file  . Highest education level: Not on file  Occupational History  . Occupation: Forensic scientist  Social Needs  . Financial resource strain: Not on file  . Food insecurity:    Worry: Not on file    Inability: Not on file  . Transportation needs:    Medical: Not on file    Non-medical: Not on file  Tobacco Use  . Smoking status: Never Smoker  . Smokeless tobacco: Never Used  Substance and Sexual Activity  . Alcohol use: No    Alcohol/week: 0.0 standard drinks    Comment: OCC.  . Drug use: No  . Sexual activity: Yes    Birth control/protection: None  Lifestyle  . Physical activity:    Days per week: Not on file    Minutes per session: Not on file  . Stress: Not on file  Relationships  . Social connections:    Talks on phone: Not on file    Gets together: Not on file    Attends religious service: Not on file    Active member of club or organization: Not on file    Attends meetings of clubs or organizations: Not on file    Relationship status: Not on file  . Intimate partner violence:    Fear of current or ex partner: Not on file    Emotionally abused: Not on file    Physically abused: Not on file    Forced sexual activity: Not on file  Other Topics Concern  . 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 MOUTH SURGERY    . TONSILLECTOMY      Family History  Problem Relation Age of Onset  . Breast cancer Maternal Grandmother   . Heart disease Maternal Grandmother   . Miscarriages / Stillbirths Maternal Grandmother   . Pancreatic cancer Unknown   . Kidney cancer Maternal Aunt   . Heart disease Father   . Heart disease Maternal Grandfather   . Varicose Veins Maternal Grandfather   . Varicose Veins Mother     PMHx, SurgHx, SocialHx, FamHx, Medications, and Allergies were reviewed in the Visit Navigator and updated as appropriate.   Patient Active Problem List   Diagnosis Date Noted  . Indication for care in labor or  delivery 07/07/2017  . [redacted] weeks gestation of pregnancy 03/19/2017  . Nonallopathic lesion of sacral region 10/26/2015  . Nonallopathic lesion of thoracic region 10/26/2015  . Nonallopathic lesion of lumbosacral region 10/26/2015  .  Scapular dysfunction 10/26/2015  . IBS (irritable bowel syndrome) 10/04/2015  . Adenomatous polyp of colon 10/04/2015  . Polyarthralgia 10/04/2015  . Eczema of both hands 09/30/2013  . Skin lesion 10/16/2012  . Vertigo 04/06/2012  . Migraine with aura 04/06/2012  . Anxiety 10/30/2011  . Depression 10/03/2011  . GILBERT'S SYNDROME 03/07/2010  . SYNCOPE 10/07/2008  . INSOMNIA-SLEEP DISORDER-UNSPEC 04/18/2007  . ALLERGIC RHINITIS 12/12/2006    Social History   Tobacco Use  . Smoking status: Never Smoker  . Smokeless tobacco: Never Used  Substance Use Topics  . Alcohol use: No    Alcohol/week: 0.0 standard drinks    Comment: OCC.  . Drug use: No    Current Medications and Allergies:    Current Outpatient Medications:  .  acetaminophen (TYLENOL) 325 MG tablet, Take 2 tablets (650 mg total) by mouth every 6 (six) hours as needed (for pain scale < 4)., Disp: 60 tablet, Rfl: 0 .  doxylamine, Sleep, (UNISOM) 25 MG tablet, Take 25 mg by mouth at bedtime., Disp: , Rfl:  .  ibuprofen (ADVIL,MOTRIN) 600 MG tablet, Take 1 tablet (600 mg total) by mouth every 6 (six) hours as needed., Disp: 60 tablet, Rfl: 0 .  Prenatal Vit-Fe Fumarate-FA (PRENATAL MULTIVITAMIN) TABS tablet, Take 1 tablet by mouth at bedtime., Disp: , Rfl:  .  pyridOXINE (VITAMIN B-6) 100 MG tablet, Take 100 mg by mouth 3 (three) times daily., Disp: , Rfl:  .  sertraline (ZOLOFT) 25 MG tablet, Take 1 tablet (25 mg total) by mouth daily., Disp: 90 tablet, Rfl: 3   Allergies  Allergen Reactions  . Amoxicillin   . Azithromycin Other (See Comments)    Severe stomach ache  . Hydrocodone Other (See Comments)    GI upset  . Morphine     Unknown, childhood reaction  . Ciprofloxacin Hcl Hives     Review of Systems   Review of Systems  Constitutional: Positive for fatigue and irritability. Negative for appetite change.  Respiratory: Negative for shortness of breath.   Cardiovascular: Negative for chest pain and palpitations.  Gastrointestinal: Positive for nausea (Pt pregnant).  Neurological: Positive for headaches. Negative for dizziness.  Psychiatric/Behavioral: Positive for depression. Negative for decreased concentration and suicidal ideas. The patient is nervous/anxious. The patient does not have insomnia.     Vitals:   Vitals:   12/25/17 1401  BP: (!) 100/56  Pulse: 77  Temp: 98.8 F (37.1 C)  TempSrc: Oral  SpO2: 97%  Weight: 123 lb (55.8 kg)  Height: 5\' 6"  (1.676 m)     Body mass index is 19.85 kg/m.   Physical Exam:    Physical Exam  Constitutional: She appears well-developed. She is cooperative.  Non-toxic appearance. She does not have a sickly appearance. She does not appear ill. No distress.  Cardiovascular: Normal rate, regular rhythm, S1 normal, S2 normal, normal heart sounds and normal pulses.  No LE edema  Pulmonary/Chest: Effort normal and breath sounds normal.  Neurological: She is alert. GCS eye subscore is 4. GCS verbal subscore is 5. GCS motor subscore is 6.  Skin: Skin is warm, dry and intact.  Psychiatric: She has a normal mood and affect. Her speech is normal and behavior is normal.  Nursing note and vitals reviewed.    Assessment and Plan:    Efrata was seen today for anxiety and depression.  Diagnoses and all orders for this visit:  Anxiety  Other orders -     sertraline (ZOLOFT) 25 MG tablet; Take  1 tablet (25 mg total) by mouth daily.   Overall doing well. Continue Zoloft 25 mg daily. Follow-up in 1 year, sooner if needed.  . Reviewed expectations re: course of current medical issues. . Discussed self-management of symptoms. . Outlined signs and symptoms indicating need for more acute intervention. . Patient  verbalized understanding and all questions were answered. . See orders for this visit as documented in the electronic medical record. . Patient received an After Visit Summary.  CMA or LPN served as scribe during this visit. History, Physical, and Plan performed by medical provider. The above documentation has been reviewed and is accurate and complete.  [name], PA-C [place], [place] 12/25/2017  Follow-up: No follow-ups on file.

## 2018-01-23 ENCOUNTER — Inpatient Hospital Stay (HOSPITAL_COMMUNITY)
Admission: AD | Admit: 2018-01-23 | Discharge: 2018-01-23 | Disposition: A | Discharge status: Home / Self Care | Payer: BLUE CROSS/BLUE SHIELD | Attending: Obstetrics and Gynecology | Admitting: Obstetrics and Gynecology

## 2018-01-23 ENCOUNTER — Inpatient Hospital Stay (HOSPITAL_COMMUNITY): Payer: BLUE CROSS/BLUE SHIELD

## 2018-01-23 ENCOUNTER — Encounter (HOSPITAL_COMMUNITY): Payer: Self-pay | Admitting: *Deleted

## 2018-01-23 DIAGNOSIS — Z3A10 10 weeks gestation of pregnancy: Secondary | ICD-10-CM

## 2018-01-23 DIAGNOSIS — Z6791 Unspecified blood type, Rh negative: Secondary | ICD-10-CM | POA: Diagnosis not present

## 2018-01-23 DIAGNOSIS — Z880 Allergy status to penicillin: Secondary | ICD-10-CM | POA: Diagnosis not present

## 2018-01-23 DIAGNOSIS — Z3A11 11 weeks gestation of pregnancy: Secondary | ICD-10-CM | POA: Diagnosis not present

## 2018-01-23 DIAGNOSIS — O209 Hemorrhage in early pregnancy, unspecified: Secondary | ICD-10-CM

## 2018-01-23 DIAGNOSIS — O26891 Other specified pregnancy related conditions, first trimester: Secondary | ICD-10-CM | POA: Diagnosis not present

## 2018-01-23 DIAGNOSIS — O208 Other hemorrhage in early pregnancy: Secondary | ICD-10-CM | POA: Insufficient documentation

## 2018-01-23 DIAGNOSIS — O26899 Other specified pregnancy related conditions, unspecified trimester: Secondary | ICD-10-CM

## 2018-01-23 LAB — CBC
HCT: 39.1 % (ref 36.0–46.0)
HEMOGLOBIN: 13.3 g/dL (ref 12.0–15.0)
MCH: 30.6 pg (ref 26.0–34.0)
MCHC: 34 g/dL (ref 30.0–36.0)
MCV: 90.1 fL (ref 80.0–100.0)
NRBC: 0 % (ref 0.0–0.2)
PLATELETS: 269 10*3/uL (ref 150–400)
RBC: 4.34 MIL/uL (ref 3.87–5.11)
RDW: 12.7 % (ref 11.5–15.5)
WBC: 8.2 10*3/uL (ref 4.0–10.5)

## 2018-01-23 LAB — POCT PREGNANCY, URINE: PREG TEST UR: POSITIVE — AB

## 2018-01-23 LAB — URINALYSIS, ROUTINE W REFLEX MICROSCOPIC
Bilirubin Urine: NEGATIVE
Glucose, UA: NEGATIVE mg/dL
Hgb urine dipstick: NEGATIVE
Ketones, ur: NEGATIVE mg/dL
LEUKOCYTES UA: NEGATIVE
NITRITE: NEGATIVE
PROTEIN: NEGATIVE mg/dL
SPECIFIC GRAVITY, URINE: 1.003 — AB (ref 1.005–1.030)
pH: 6 (ref 5.0–8.0)

## 2018-01-23 LAB — HCG, QUANTITATIVE, PREGNANCY: HCG, BETA CHAIN, QUANT, S: 76250 m[IU]/mL — AB (ref <5)

## 2018-01-23 MED ORDER — RHO D IMMUNE GLOBULIN 1500 UNIT/2ML IJ SOSY
300.0000 ug | PREFILLED_SYRINGE | Freq: Once | INTRAMUSCULAR | Status: AC
  Administered 2018-01-23: 300 ug via INTRAMUSCULAR
  Filled 2018-01-23: qty 2

## 2018-01-23 NOTE — MAU Provider Note (Signed)
History     CSN: [id number]  Arrival date and time: 01/23/18 1450   First Provider Initiated Contact with Patient 01/23/18 1808      Chief Complaint  Patient presents with  . Pelvic Pain  . Vaginal Bleeding   Mary Cochran is a 22 y.o. G2P1001 at [redacted]w[redacted]d who presents today with vaginal bleeding. She states that she has started care at [facility], and had an [place] there last week. She was told she had a subchorionic hemorrhage. She reports that she has had bleeding that started today.   Vaginal Bleeding  The patient's primary symptoms include vaginal bleeding. This is a new problem. The current episode started today. The problem occurs constantly. The problem has been unchanged. The patient is experiencing no pain. She is pregnant. The vaginal discharge was bloody. The vaginal bleeding is spotting. She has not been passing clots. She has not been passing tissue. Nothing aggravates the symptoms. She has tried nothing for the symptoms.    OB History    Gravida  2   Para  1   Term  1   Preterm      AB      Living  1     SAB      TAB      Ectopic      Multiple  0   Live Births  1           Past Medical History:  Diagnosis Date  . Adenomatous polyp of colon 10/04/2015  . ALLERGIC RHINITIS 12/12/2006  . Allergy    SEASONAL  . Anxiety   . Depression 10/03/2011  . GILBERT'S SYNDROME 03/07/2010  . Headache    Migraines  . IBS (irritable bowel syndrome) 10/04/2015  . INSOMNIA-SLEEP DISORDER-UNSPEC 04/18/2007  . Tubular adenoma of colon 10/2014    Past Surgical History:  Procedure Laterality Date  . MOUTH SURGERY    . TONSILLECTOMY      Family History  Problem Relation Age of Onset  . Breast cancer Maternal Grandmother   . Heart disease Maternal Grandmother   . Miscarriages / Stillbirths Maternal Grandmother   . Pancreatic cancer Unknown   . Kidney cancer Maternal Aunt   . Heart disease Father   . Heart disease Maternal Grandfather   . Varicose Veins  Maternal Grandfather   . Varicose Veins Mother     Social History   Tobacco Use  . Smoking status: Never Smoker  . Smokeless tobacco: Never Used  Substance Use Topics  . Alcohol use: No    Alcohol/week: 0.0 standard drinks    Comment: OCC.  . Drug use: No    Allergies:  Allergies  Allergen Reactions  . Amoxicillin   . Azithromycin Other (See Comments)    Severe stomach ache  . Hydrocodone Other (See Comments)    GI upset  . Morphine     Unknown, childhood reaction  . Ciprofloxacin Hcl Hives    No medications prior to admission.    Review of Systems  Genitourinary: Positive for vaginal bleeding.  All other systems reviewed and are negative.  Physical Exam   Blood pressure 108/67, pulse 84, temperature 98.3 F (36.8 C), temperature source Oral, resp. rate 16, height 5\' 7"  (1.702 m), weight 54.5 kg, last menstrual period 11/05/2017, not currently breastfeeding.  Physical Exam  Nursing note and vitals reviewed. Constitutional: She is oriented to person, place, and time. She appears well-developed and well-nourished. No distress.  HENT:  Head: Normocephalic.  Cardiovascular:  Normal rate.  Respiratory: Effort normal.  GI: Soft. There is no abdominal tenderness. There is no rebound.  Neurological: She is alert and oriented to person, place, and time.  Skin: Skin is warm and dry.  Psychiatric: She has a normal mood and affect.     Results for orders placed or performed during the hospital encounter of 01/23/18 (from the past 24 hour(s))  Urinalysis, Routine w reflex microscopic     Status: Abnormal   Collection Time: 01/23/18  3:12 PM  Result Value Ref Range   Color, Urine STRAW (A) YELLOW   APPearance CLEAR CLEAR   Specific Gravity, Urine 1.003 (L) 1.005 - 1.030   pH 6.0 5.0 - 8.0   Glucose, UA NEGATIVE NEGATIVE mg/dL   Hgb urine dipstick NEGATIVE NEGATIVE   Bilirubin Urine NEGATIVE NEGATIVE   Ketones, ur NEGATIVE NEGATIVE mg/dL   Protein, ur NEGATIVE  NEGATIVE mg/dL   Nitrite NEGATIVE NEGATIVE   Leukocytes, UA NEGATIVE NEGATIVE  Pregnancy, urine POC     Status: Abnormal   Collection Time: 01/23/18  3:15 PM  Result Value Ref Range   Preg Test, Ur POSITIVE (A) NEGATIVE  hCG, quantitative, pregnancy     Status: Abnormal   Collection Time: 01/23/18  4:14 PM  Result Value Ref Range   hCG, Beta Chain, Quant, S 76,250 (H) <5 mIU/mL  Rh IG workup (includes ABO/Rh)     Status: None (Preliminary result)   Collection Time: 01/23/18  4:14 PM  Result Value Ref Range   Gestational Age(Wks) 10    ABO/RH(D) O NEG    Antibody Screen NEG    Unit Number [id number]    Blood Component Type RHIG    Unit division 00    Status of Unit ISSUED    Transfusion Status      OK TO TRANSFUSE Performed at [facility], [address], [place], [place] [address]   CBC     Status: None   Collection Time: 01/23/18  4:18 PM  Result Value Ref Range   WBC 8.2 4.0 - 10.5 K/uL   RBC 4.34 3.87 - 5.11 MIL/uL   Hemoglobin 13.3 12.0 - 15.0 g/dL   HCT 39.1 36.0 - 46.0 %   MCV 90.1 80.0 - 100.0 fL   MCH 30.6 26.0 - 34.0 pg   MCHC 34.0 30.0 - 36.0 g/dL   RDW 12.7 11.5 - 15.5 %   Platelets 269 150 - 400 K/uL   nRBC 0.0 0.0 - 0.2 %   US Ob Less Than 14 Weeks With Ob Transvaginal  Result Date: 01/23/2018 CLINICAL DATA:  Vaginal bleeding in first trimester pregnancy, pelvic pain; no quantitative beta HCG for correlation.; unknown LMP EXAM: OBSTETRIC <14 WK [place] AND TRANSVAGINAL OB US TECHNIQUE: Both transabdominal and transvaginal ultrasound examinations were performed for complete evaluation of the gestation as well as the maternal uterus, adnexal regions, and pelvic cul-de-sac. Transvaginal technique was performed to assess early pregnancy. COMPARISON:  None FINDINGS: Intrauterine gestational sac: Present, single Yolk sac:  Not visualized Embryo:  Present Cardiac Activity: Present Heart Rate: 159 bpm CRL:  39.5 mm   10 w   6 d                  [place] EDC: 08/15/2018  Subchorionic hemorrhage:  Small subchorionic hemorrhage identified Maternal uterus/adnexae: RIGHT ovary normal size and morphology, 1.9 x 2.5 x 1.9 cm, containing a small corpus luteum. LEFT ovary normal size and morphology, 2.5 x 1.2 x 2.0 cm.  No free pelvic fluid or adnexal masses. IMPRESSION: Single live intrauterine gestation at 10 weeks 6 days EGA by crown-rump length. Small subchronic hemorrhage. Electronically Signed   By: Lavonia Dana M.D.   On: 01/23/2018 17:39     MAU Course  Procedures  MDM   Assessment and Plan   1. [redacted] weeks gestation of pregnancy   2. Vaginal bleeding in pregnancy, first trimester   3. Rh negative state in antepartum period    Rhogam given here in MAU today   DC home Comfort measures reviewed  1st Trimester precautions  Bleeding precautions RX: none  Return to MAU as needed FU with OB as planned   [place], CNM  01/23/18  10:16 PM

## 2018-01-23 NOTE — Discharge Instructions (Signed)
Rh0 [D] Immune Globulin injection What is this medicine? RhO [D] IMMUNE GLOBULIN (i MYOON GLOB yoo lin) is used to treat idiopathic thrombocytopenic purpura (ITP). This medicine is used in RhO negative mothers who are pregnant with a RhO positive child. It is also used after a transfusion of RhO positive blood into a RhO negative person. This medicine may be used for other purposes; ask your health care provider or pharmacist if you have questions. COMMON BRAND NAME(S): BayRho-D, HyperRHO S/D, MICRhoGAM, RhoGAM, Rhophylac, WinRho SDF What should I tell my health care provider before I take this medicine? They need to know if you have any of these conditions: -bleeding disorders -low levels of immunoglobulin A in the body -no spleen -an unusual or allergic reaction to human immune globulin, other medicines, foods, dyes, or preservatives -pregnant or trying to get pregnant -breast-feeding How should I use this medicine? This medicine is for injection into a muscle or into a vein. It is given by a health care professional in a hospital or clinic setting. Talk to your pediatrician regarding the use of this medicine in children. This medicine is not approved for use in children. Overdosage: If you think you have taken too much of this medicine contact a poison control center or emergency room at once. NOTE: This medicine is only for you. Do not share this medicine with others. What if I miss a dose? It is important not to miss your dose. Call your doctor or health care professional if you are unable to keep an appointment. What may interact with this medicine? -live virus vaccines, like measles, mumps, or rubella This list may not describe all possible interactions. Give your health care provider a list of all the medicines, herbs, non-prescription drugs, or dietary supplements you use. Also tell them if you smoke, drink alcohol, or use illegal drugs. Some items may interact with your  medicine. What should I watch for while using this medicine? This medicine is made from human blood. It may be possible to pass an infection in this medicine. Talk to your doctor about the risks and benefits of this medicine. This medicine may interfere with live virus vaccines. Before you get live virus vaccines tell your health care professional if you have received this medicine within the past 3 months. What side effects may I notice from receiving this medicine? Side effects that you should report to your doctor or health care professional as soon as possible: -allergic reactions like skin rash, itching or hives, swelling of the face, lips, or tongue -breathing problems -chest pain or tightness -yellowing of the eyes or skin Side effects that usually do not require medical attention (report to your doctor or health care professional if they continue or are bothersome): -fever -pain and tenderness at site where injected This list may not describe all possible side effects. Call your doctor for medical advice about side effects. You may report side effects to FDA at 1-800-FDA-1088. Where should I keep my medicine? This drug is given in a hospital or clinic and will not be stored at home. NOTE: This sheet is a summary. It may not cover all possible information. If you have questions about this medicine, talk to your doctor, pharmacist, or health care provider.  2019 Elsevier/Gold Standard (2007-09-22 14:06:10)

## 2018-01-23 NOTE — MAU Note (Signed)
Pt C/O small amount of bleeding today, woke up having severe cramps this morning. Describes bleeding as spotting.  Pos HPT & UPT in MD office.

## 2018-01-24 LAB — RH IG WORKUP (INCLUDES ABO/RH)
ABO/RH(D): O NEG
ANTIBODY SCREEN: NEGATIVE
GESTATIONAL AGE(WKS): 10
Unit division: 0

## 2018-03-25 DIAGNOSIS — Z363 Encounter for antenatal screening for malformations: Secondary | ICD-10-CM | POA: Diagnosis not present

## 2018-04-04 ENCOUNTER — Encounter: Payer: Self-pay | Admitting: Gastroenterology

## 2018-04-28 ENCOUNTER — Ambulatory Visit: Payer: Self-pay

## 2018-04-28 NOTE — Telephone Encounter (Signed)
Patient called and says since Friday, she's had a sore throat from her sinuses draining. She says her nose is stuffy and she's been taking mucinex liquid every 4 hours for the congestion since Saturday. She says she has pain to her nose, forehead and she's been taking Tylenol every 4 hours since yesterday. She says when she blows her nose, it's clear with a greenish tint. She says her pain is a 6-8 to her throat and it's a stabbing pain on the left side. She says her throat red, she has no tonsils. Other symptoms are headache today and she says her ears feel clogged up. According to protocol, see PCP within 24 hours. Patient advised someone from the office will call her tomorrow with advice from [facility], advised she may decide to do a webex visit instead of coming in the office, patient's email confirmed in chart, care advice given, patient verbalized understanding.  Reason for Disposition . Earache also present  Answer Assessment - Initial Assessment Questions 1. ONSET: "When did the throat start hurting?" (Hours or days ago)      Friday 2. SEVERITY: "How bad is the sore throat?" (Scale 1-10; mild, moderate or severe)   - MILD (1-3):  doesn't interfere with eating or normal activities   - MODERATE (4-7): interferes with eating some solids and normal activities   - SEVERE (8-10):  excruciating pain, interferes with most normal activities   - SEVERE DYSPHAGIA: can't swallow liquids, drooling     6-8 when not on medicine on the left side, stabbing pain 3. STREP EXPOSURE: "Has there been any exposure to strep within the past week?" If so, ask: "What type of contact occurred?"      No 4.  VIRAL SYMPTOMS: "Are there any symptoms of a cold, such as a runny nose, cough, hoarse voice or red eyes?"      Yes, runny nose, sinus congestion/pain 5. FEVER: "Do you have a fever?" If so, ask: "What is your temperature, how was it measured, and when did it start?"     No 6. PUS ON THE TONSILS: "Is there pus on  the tonsils in the back of your throat?"     No tonsils 7. OTHER SYMPTOMS: "Do you have any other symptoms?" (e.g., difficulty breathing, headache, rash)     Ears clogged up, headache today 8. PREGNANCY: "Is there any chance you are pregnant?" "When was your last menstrual period?"     Pregnant now 24 weeks  Protocols used: SORE THROAT-A-AH

## 2018-04-29 ENCOUNTER — Telehealth: Payer: Self-pay | Admitting: *Deleted

## 2018-04-29 ENCOUNTER — Encounter: Payer: Self-pay | Admitting: Physician Assistant

## 2018-04-29 ENCOUNTER — Ambulatory Visit (INDEPENDENT_AMBULATORY_CARE_PROVIDER_SITE_OTHER): Payer: Medicaid Other | Admitting: Physician Assistant

## 2018-04-29 VITALS — BP 90/56 | HR 88 | Temp 97.7°F | Ht 67.0 in | Wt 126.0 lb

## 2018-04-29 DIAGNOSIS — J029 Acute pharyngitis, unspecified: Secondary | ICD-10-CM | POA: Diagnosis not present

## 2018-04-29 LAB — POCT RAPID STREP A (OFFICE): Rapid Strep A Screen: NEGATIVE

## 2018-04-29 NOTE — Telephone Encounter (Signed)
Routing comment     Matilde Sprang, RN 14 hours ago (6:54 PM)      Patient called and says since Friday, she's had a sore throat from her sinuses draining. She says her nose is stuffy and she's been taking mucinex liquid every 4 hours for the congestion since Saturday. She says she has pain to her nose, forehead and she's been taking Tylenol every 4 hours since yesterday. She says when she blows her nose, it's clear with a greenish tint. She says her pain is a 6-8 to her throat and it's a stabbing pain on the left side. She says her throat red, she has no tonsils. Other symptoms are headache today and she says her ears feel clogged up. According to protocol, see PCP within 24 hours. Patient advised someone from the office will call her tomorrow with advice from [facility], advised she may decide to do a webex visit instead of coming in the office, patient's email confirmed in chart, care advice given, patient verbalized understanding.  Reason for Disposition . Earache also present  Answer Assessment - Initial Assessment Questions 1. ONSET: "When did the throat start hurting?" (Hours or days ago)      Friday 2. SEVERITY: "How bad is the sore throat?" (Scale 1-10; mild, moderate or severe)   - MILD (1-3):  doesn't interfere with eating or normal activities   - MODERATE (4-7): interferes with eating some solids and normal activities   - SEVERE (8-10):  excruciating pain, interferes with most normal activities   - SEVERE DYSPHAGIA: can't swallow liquids, drooling     6-8 when not on medicine on the left side, stabbing pain 3. STREP EXPOSURE: "Has there been any exposure to strep within the past week?" If so, ask: "What type of contact occurred?"      No 4.  VIRAL SYMPTOMS: "Are there any symptoms of a cold, such as a runny nose, cough, hoarse voice or red eyes?"      Yes, runny nose, sinus congestion/pain 5. FEVER: "Do you have a fever?" If so, ask: "What is your temperature, how was it  measured, and when did it start?"     No 6. PUS ON THE TONSILS: "Is there pus on the tonsils in the back of your throat?"     No tonsils 7. OTHER SYMPTOMS: "Do you have any other symptoms?" (e.g., difficulty breathing, headache, rash)     Ears clogged up, headache today 8. PREGNANCY: "Is there any chance you are pregnant?" "When was your last menstrual period?"     Pregnant now 24 weeks  Protocols used: SORE THROAT-A-AH

## 2018-04-29 NOTE — Telephone Encounter (Signed)
See note. Please confirm with Hailey if webex is appropriate

## 2018-04-29 NOTE — Telephone Encounter (Signed)
Webex is appropriate, please schedule.

## 2018-04-29 NOTE — Progress Notes (Signed)
Virtual Visit via Video   I connected with Mary Cochran on 04/29/18 at 11:00 AM EDT by a video enabled telemedicine application and verified that I am speaking with the correct person using two identifiers. Location patient: Home Location provider: [facility], Office Persons participating in the virtual visit: [name], PA-C, Anselmo Pickler, LPN and [employer].  I discussed the limitations of evaluation and management by telemedicine and the availability of in person appointments. The patient expressed understanding and agreed to proceed.  Subjective:  I acted as a Education administrator for [employer], PA-C [employer], LPN  HPI:  Pt c/o sore throat, head and nasal congestion started on Friday.  Having dry non-productive cough. Ears feel plugged, having clear / light green nasal drainage. Pt said has patches of red in the back of her throat. Pt states symptoms are getting worse. Having fatigue and has been staying home. Practicing social distancing. Denies fever but is having night sweats. Pt's infant daughter has been sick last 10 days, was seen at pediatrician and said that she likely has a virus. Pt is [redacted] weeks pregnant and blood pressure has been running low at OB visit last week, per her report.   Denies: shortness of breath, travel, known sick exposures with COVID  ROS: See pertinent positives and negatives per HPI.  Patient Active Problem List   Diagnosis Date Noted  . Indication for care in labor or delivery 07/07/2017  . [redacted] weeks gestation of pregnancy 03/19/2017  . Nonallopathic lesion of sacral region 10/26/2015  . Nonallopathic lesion of thoracic region 10/26/2015  . Nonallopathic lesion of lumbosacral region 10/26/2015  . Scapular dysfunction 10/26/2015  . IBS (irritable bowel syndrome) 10/04/2015  . Adenomatous polyp of colon 10/04/2015  . Polyarthralgia 10/04/2015  . Eczema of both hands 09/30/2013  . Skin lesion 10/16/2012  . Vertigo 04/06/2012  .  Migraine with aura 04/06/2012  . Anxiety 10/30/2011  . Depression 10/03/2011  . GILBERT'S SYNDROME 03/07/2010  . SYNCOPE 10/07/2008  . INSOMNIA-SLEEP DISORDER-UNSPEC 04/18/2007  . ALLERGIC RHINITIS 12/12/2006    Social History   Tobacco Use  . Smoking status: Never Smoker  . Smokeless tobacco: Never Used  Substance Use Topics  . Alcohol use: No    Alcohol/week: 0.0 standard drinks    Comment: OCC.    Current Outpatient Medications:  .  acetaminophen (TYLENOL) 325 MG tablet, Take 2 tablets (650 mg total) by mouth every 6 (six) hours as needed (for pain scale < 4)., Disp: 60 tablet, Rfl: 0 .  BONJESTA 20-20 MG TBCR, Take 1 tablet by mouth 2 (two) times daily., Disp: , Rfl:  .  Prenatal Vit-Fe Fumarate-FA (PRENATAL MULTIVITAMIN) TABS tablet, Take 1 tablet by mouth at bedtime., Disp: , Rfl:  .  sertraline (ZOLOFT) 25 MG tablet, Take 1 tablet (25 mg total) by mouth daily., Disp: 90 tablet, Rfl: 3  Allergies  Allergen Reactions  . Amoxicillin   . Azithromycin Other (See Comments)    Severe stomach ache  . Hydrocodone Other (See Comments)    GI upset  . Morphine     Unknown, childhood reaction  . Ciprofloxacin Hcl Hives   Vitals:   04/29/18 1100  BP: (!) 90/56  Pulse: 88  Temp: 97.7 F (36.5 C)  SpO2: 97%    Objective:   VITALS: Per patient if applicable, see vitals. GENERAL: Alert, appears well and in no acute distress. HEENT: Atraumatic, conjunctiva clear, no obvious abnormalities on inspection of external nose and ears. (during  car visit, I inspected b/l TM and throat, no abnormalities) NECK: Normal movements of the head and neck. CARDIOPULMONARY: No increased WOB. Speaking in clear sentences. I:E ratio WNL. (during car visit I listened to lungs, CTA bilaterally) MS: Moves all visible extremities without noticeable abnormality. PSYCH: Pleasant and cooperative, well-groomed. Speech normal rate and rhythm. Affect is appropriate. Insight and judgement are appropriate.  Attention is focused, linear, and appropriate.  NEURO: CN grossly intact. Oriented as arrived to appointment on time with no prompting. Moves both UE equally.  SKIN: No obvious lesions, wounds, erythema, or cyanosis noted on face or hands.  Results for orders placed or performed in visit on 04/29/18  POCT rapid strep A  Result Value Ref Range   Rapid Strep A Screen Negative Negative    Assessment and Plan:    Mary Cochran was seen today for sore throat and cough.  Diagnoses and all orders for this visit:  Sore throat -     POCT rapid strep A   No red flags on exam. Car visit performed to r/o strep after Webex meeting performed. Strep negative. Suspect viral URI, low-suspicion for COVID at this time. No COVID or flu testing performed.  Discussed supportive care and continueDiscussed taking medications as prescribed. Reviewed return precautions including worsening fever, SOB, worsening cough or other concerns. Push fluids and rest. I recommend that patient follow-up if symptoms worsen or persist despite treatment x 7-10 days, sooner if needed.   . Reviewed expectations re: course of current medical issues. . Discussed self-management of symptoms. . Outlined signs and symptoms indicating need for more acute intervention. . Patient verbalized understanding and all questions were answered. Marland Kitchen Health Maintenance issues including appropriate healthy diet, exercise, and smoking avoidance were discussed with patient. . See orders for this visit as documented in the electronic medical record.  [place], [place] 04/29/2018

## 2018-04-29 NOTE — Patient Instructions (Signed)
Strep is negative.  Here are current guidelines: Patients who have mild symptoms consistent with COVID-19 should self-isolate for at least 7 days from symptom onset and ?72 hours after recovery (absence of fever without the use of fever-reducing medication and improvement in respiratory symptoms). Notably, patients with clinical COVID-19 infection do NOT need a negative COVID-19 test result to document recovery, if they meet the clinical criteria.  Household close contacts of a patients with mild symptoms who is self-isolating at home should selfmonitor their temperature and symptoms of COVID-19, limit outside interaction as much as possible for 14 days, and self-isolate if they develop symptoms.  Push fluids and get plenty of rest. Please return if you are not improving as expected, or if you have high fevers (>101.5) or difficulty swallowing or worsening productive cough.  Call clinic with questions.  I hope you start feeling better soon!

## 2018-04-29 NOTE — Telephone Encounter (Signed)
Please see message and advise if WebEx is appropriate?

## 2018-05-01 ENCOUNTER — Telehealth: Payer: Self-pay | Admitting: Gastroenterology

## 2018-05-07 ENCOUNTER — Other Ambulatory Visit: Payer: Self-pay

## 2018-05-07 ENCOUNTER — Ambulatory Visit: Payer: Medicaid Other | Admitting: Gastroenterology

## 2018-05-07 ENCOUNTER — Encounter: Payer: Self-pay | Admitting: Gastroenterology

## 2018-05-07 DIAGNOSIS — K580 Irritable bowel syndrome with diarrhea: Secondary | ICD-10-CM

## 2018-05-07 DIAGNOSIS — R197 Diarrhea, unspecified: Secondary | ICD-10-CM

## 2018-05-07 DIAGNOSIS — R1030 Lower abdominal pain, unspecified: Secondary | ICD-10-CM

## 2018-05-07 NOTE — Progress Notes (Signed)
History of Present Illness: This is a 23 year old female referred by [name], PA for the evaluation of diarrhea, cramping. She is [redacted] weeks pregnant.  She relates intermittent but frequent crampy abdominal pain associated with urgent postprandial diarrhea following meals.  The symptoms have worsened over the course of her pregnancy.  She noted the same difficulties during her last pregnancy.  Following her last pregnancy her symptoms were infrequent.  She cannot pinpoint a specific food or beverage.  Her pregnancy otherwise has been uneventful. Denies weight loss, constipation, change in stool caliber, melena, hematochezia, nausea, vomiting, dysphagia, reflux symptoms, chest pain.    Allergies  Allergen Reactions  . Amoxicillin   . Azithromycin Other (See Comments)    Severe stomach ache  . Hydrocodone Other (See Comments)    GI upset  . Morphine     Unknown, childhood reaction  . Ciprofloxacin Hcl Hives   Outpatient Medications Prior to Visit  Medication Sig Dispense Refill  . acetaminophen (TYLENOL) 325 MG tablet Take 2 tablets (650 mg total) by mouth every 6 (six) hours as needed (for pain scale < 4). 60 tablet 0  . BONJESTA 20-20 MG TBCR Take 1 tablet by mouth 2 (two) times daily.    . Prenatal Vit-Fe Fumarate-FA (PRENATAL MULTIVITAMIN) TABS tablet Take 1 tablet by mouth at bedtime.    . sertraline (ZOLOFT) 25 MG tablet Take 1 tablet (25 mg total) by mouth daily. 90 tablet 3   No facility-administered medications prior to visit.    Past Medical History:  Diagnosis Date  . ALLERGIC RHINITIS 12/12/2006  . Allergy    SEASONAL  . Anxiety   . Depression 10/03/2011  . GILBERT'S SYNDROME 03/07/2010  . Headache    Migraines  . IBS (irritable bowel syndrome) 10/04/2015  . INSOMNIA-SLEEP DISORDER-UNSPEC 04/18/2007  . Tubular adenoma of colon 10/2014   Past Surgical History:  Procedure Laterality Date  . MOUTH SURGERY    . TONSILLECTOMY     Social History   Socioeconomic  History  . Marital status: Married    Spouse name: Not on file  . Number of children: Not on file  . Years of education: Not on file  . Highest education level: Not on file  Occupational History  . Occupation: Social worker  Social Needs  . Financial resource strain: Not on file  . Food insecurity:    Worry: Not on file    Inability: Not on file  . Transportation needs:    Medical: Not on file    Non-medical: Not on file  Tobacco Use  . Smoking status: Never Smoker  . Smokeless tobacco: Never Used  Substance and Sexual Activity  . Alcohol use: No    Alcohol/week: 0.0 standard drinks    Comment: OCC.  . Drug use: No  . Sexual activity: Yes    Birth control/protection: None  Lifestyle  . Physical activity:    Days per week: Not on file    Minutes per session: Not on file  . Stress: Not on file  Relationships  . Social connections:    Talks on phone: Not on file    Gets together: Not on file    Attends religious service: Not on file    Active member of club or organization: Not on file    Attends meetings of clubs or organizations: Not on file    Relationship status: Not on file  Other Topics Concern  . 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 Breast cancer Maternal Grandmother   . Heart disease Maternal Grandmother   . Miscarriages / Stillbirths Maternal Grandmother   . Pancreatic cancer Other   . Kidney cancer Maternal Aunt   . Heart disease Father   . Heart disease Maternal Grandfather   . Varicose Veins Maternal Grandfather   . Varicose Veins Mother       Review of Systems: Pertinent positive and negative review of systems were noted in the above HPI section. All other review of systems were otherwise negative.   Physical Exam: Not done - telehealth   Assessment and Recommendations:  1. IBS-D.Given COVID-19 restrictions no  additional testing at this time however if she does not improve significantly with treatment for IBS-D will check tTG, IgA, GI pathogen panel and consider further evaluation. Lactose free diet for 5-7 days. Minimize/avoid caffeine. If not effective then begin a trial of a low FODMAP diet. Dicyclomine 10 mg po tid ac or IBGard, 1-2 po tid ac or glycopyrrolate 1 mg po bid ac per approval of her OB.  She is advised to contact her OB and call us back.  REV in 1 month if not significantly improved, otherwise REV after COVID-19 restrictions have been relaxed.  2. Personal history of adenomatous colon polyps.  A 5-year interval surveillance colonoscopy is recommended in September 2021.    This service was provided via telehealth  The patient was located at home with her daughter, who is about 35 months old.  The provider was located in my office, alone.  The patient consented to this telehealth visit and is aware of possible charges for this visit.  The other persons participating in this telehealth service was Marlon Pel, [place] and their role was reviewing medications, past history, AVS.  Time spent on call: 15 minutes Total time spent on call with me, with CMA, reviewing records, coordinating care: 35 minutes   cc: [name], [place] [address] [place],  [address]

## 2018-05-07 NOTE — Patient Instructions (Addendum)
Start a lactose free diet x 5-7 days. If this is not effective then try a low-fodmap diet. These diets have been mailed to you as well.   Minimize/Avoid the use of caffeine.   Dr. Fuller Plan recommends you start a prescription called dicyclomine 10 mg three times a before meals or glycopyrrolate 1 mg twice daily before meals. Also you can take an over the counter IB gard 1-2 capsules by mouth three times a day before meals. We have not prescribed these medications until approval with your OB physician.  Follow up with Dr. Fuller Plan  after covid-19 restrictions if symptoms have not resolved.   Thank you for choosing me and [facility].  Pricilla Riffle. Dagoberto Ligas., MD., Marval Regal

## 2018-05-14 ENCOUNTER — Encounter: Payer: Self-pay | Admitting: Physician Assistant

## 2018-05-15 ENCOUNTER — Encounter: Payer: Self-pay | Admitting: Physician Assistant

## 2018-05-15 ENCOUNTER — Ambulatory Visit (INDEPENDENT_AMBULATORY_CARE_PROVIDER_SITE_OTHER): Payer: Medicaid Other | Admitting: Physician Assistant

## 2018-05-15 DIAGNOSIS — R0981 Nasal congestion: Secondary | ICD-10-CM

## 2018-05-15 MED ORDER — CEFDINIR 300 MG PO CAPS: 300.0000 mg | ORAL_CAPSULE | Freq: Two times a day (BID) | ORAL | 0 refills | Status: AC

## 2018-05-15 NOTE — Progress Notes (Signed)
Virtual Visit via Video   I connected with Mary Cochran on 05/15/18 at  1:00 PM EDT by a video enabled telemedicine application and verified that I am speaking with the correct person using two identifiers. Location patient: Home Location provider: [facility], Office Persons participating in the virtual visit: Mary Cochran, Mary Cochran, Mary Cochran, Mary Cochran, Mary Cochran.  I discussed the limitations of evaluation and management by telemedicine and the availability of in person appointments. The patient expressed understanding and agreed to proceed.  Subjective:   HPI:  Nasal Congestion Pt was seen virtually on 3/24 for sore throat. She was checked for Pt is feeling better then she was but now c/o having to blow her nose a lot with yellow drainage. Throat feels thick with mucus. Pt is [redacted] weeks pregnant.  She has tried some tylenol but not much other medication.  Denies: fever, chills, cough, SOB  ROS: See pertinent positives and negatives per HPI.  Patient Active Problem List   Diagnosis Date Noted  . Indication for care in labor or delivery 07/07/2017  . [redacted] weeks gestation of pregnancy 03/19/2017  . Nonallopathic lesion of sacral region 10/26/2015  . Nonallopathic lesion of thoracic region 10/26/2015  . Nonallopathic lesion of lumbosacral region 10/26/2015  . Scapular dysfunction 10/26/2015  . IBS (irritable bowel syndrome) 10/04/2015  . Adenomatous polyp of colon 10/04/2015  . Polyarthralgia 10/04/2015  . Eczema of both hands 09/30/2013  . Skin lesion 10/16/2012  . Vertigo 04/06/2012  . Migraine with aura 04/06/2012  . Anxiety 10/30/2011  . Depression 10/03/2011  . GILBERT'S SYNDROME 03/07/2010  . SYNCOPE 10/07/2008  . INSOMNIA-SLEEP DISORDER-UNSPEC 04/18/2007  . ALLERGIC RHINITIS 12/12/2006    Social History   Tobacco Use  . Smoking status: Never Smoker  . Smokeless tobacco: Never Used  Substance Use Topics  . Alcohol use: No    Alcohol/week: 0.0  standard drinks    Comment: OCC.    Current Outpatient Medications:  .  acetaminophen (TYLENOL) 325 MG tablet, Take 2 tablets (650 mg total) by mouth every 6 (six) hours as needed (for pain scale < 4)., Disp: 60 tablet, Rfl: 0 .  BONJESTA 20-20 MG TBCR, Take 1 tablet by mouth 2 (two) times daily., Disp: , Rfl:  .  Prenatal Vit-Fe Fumarate-FA (PRENATAL MULTIVITAMIN) TABS tablet, Take 1 tablet by mouth at bedtime., Disp: , Rfl:  .  sertraline (ZOLOFT) 25 MG tablet, Take 1 tablet (25 mg total) by mouth daily., Disp: 90 tablet, Rfl: 3 .  cefdinir (OMNICEF) 300 MG capsule, Take 1 capsule (300 mg total) by mouth 2 (two) times daily for 5 days., Disp: 10 capsule, Rfl: 0  Allergies  Allergen Reactions  . Amoxicillin Hives  . Azithromycin Other (See Comments)    Severe stomach ache  . Hydrocodone Other (See Comments)    GI upset  . Morphine     Unknown, childhood reaction  . Ciprofloxacin Hcl Hives    Objective:   VITALS: Per patient if applicable, see vitals. GENERAL: Alert, appears well and in no acute distress. HEENT: Atraumatic, conjunctiva clear, no obvious abnormalities on inspection of external nose and ears. NECK: Normal movements of the head and neck. CARDIOPULMONARY: No increased WOB. Speaking in clear sentences. I:E ratio WNL.  MS: Moves all visible extremities without noticeable abnormality. PSYCH: Pleasant and cooperative, well-groomed. Speech normal rate and rhythm. Affect is appropriate. Insight and judgement are appropriate. Attention is focused, linear, and appropriate.  NEURO: CN grossly intact. Oriented as arrived  to appointment on time with no prompting. Moves both UE equally.  SKIN: No obvious lesions, wounds, erythema, or cyanosis noted on face or hands.  Assessment and Plan:   Mary Cochran was seen today for nasal congestion.  Diagnoses and all orders for this visit:  Nasal congestion  Other orders -     cefdinir (OMNICEF) 300 MG capsule; Take 1 capsule (300 mg  total) by mouth 2 (two) times daily for 5 days.   No red flags on exam.  Suspect early sinusitis. Discussed use of mucinex. Did give safety rx of omnicef and indications of when to use. She is PCN allergic and I recommended close monitoring of symptoms while on this, she states that she "may have had hives with PCN as a baby, I'm not sure."   Discussed taking medications as prescribed. Reviewed return precautions including worsening fever, SOB, worsening cough or other concerns. Push fluids and rest. I recommend that patient follow-up if symptoms worsen or persist despite treatment x 7-10 days, sooner if needed.  . Reviewed expectations re: course of current medical issues. . Discussed self-management of symptoms. . Outlined signs and symptoms indicating need for more acute intervention. . Patient verbalized understanding and all questions were answered. Marland Kitchen Health Maintenance issues including appropriate healthy diet, exercise, and smoking avoidance were discussed with patient. . See orders for this visit as documented in the electronic medical record.  I discussed the assessment and treatment plan with the patient. The patient was provided an opportunity to ask questions and all were answered. The patient agreed with the plan and demonstrated an understanding of the instructions.   The patient was advised to call back or seek an in-person evaluation if the symptoms worsen or if the condition fails to improve as anticipated.  CMA or LPN served as scribe during this visit. History, Physical, and Plan performed by medical provider. The above documentation has been reviewed and is accurate and complete.   Mary Cochran, Mary Cochran 05/15/2018

## 2018-05-20 DIAGNOSIS — Z130 Encounter for screening for diseases of the blood and blood-forming organs and certain disorders involving the immune mechanism: Secondary | ICD-10-CM | POA: Diagnosis not present

## 2018-05-20 DIAGNOSIS — Z3402 Encounter for supervision of normal first pregnancy, second trimester: Secondary | ICD-10-CM | POA: Diagnosis not present

## 2018-05-20 DIAGNOSIS — Z3A27 27 weeks gestation of pregnancy: Secondary | ICD-10-CM | POA: Diagnosis not present

## 2018-06-10 DIAGNOSIS — O360931 Maternal care for other rhesus isoimmunization, third trimester, fetus 1: Secondary | ICD-10-CM | POA: Diagnosis not present

## 2018-06-10 DIAGNOSIS — Z418 Encounter for other procedures for purposes other than remedying health state: Secondary | ICD-10-CM | POA: Diagnosis not present

## 2018-06-12 ENCOUNTER — Other Ambulatory Visit: Payer: Self-pay | Admitting: Family Medicine

## 2018-06-12 ENCOUNTER — Ambulatory Visit (INDEPENDENT_AMBULATORY_CARE_PROVIDER_SITE_OTHER): Payer: Medicaid Other | Admitting: Family Medicine

## 2018-06-12 ENCOUNTER — Ambulatory Visit: Payer: Medicaid Other | Admitting: Family Medicine

## 2018-06-12 ENCOUNTER — Other Ambulatory Visit: Payer: Self-pay

## 2018-06-12 DIAGNOSIS — J329 Chronic sinusitis, unspecified: Secondary | ICD-10-CM | POA: Diagnosis not present

## 2018-06-12 MED ORDER — CEFDINIR 300 MG PO CAPS: 300.0000 mg | ORAL_CAPSULE | Freq: Two times a day (BID) | ORAL | 0 refills | Status: DC

## 2018-06-12 MED ORDER — AZELASTINE HCL 0.1 % NA SOLN: 2.0000 | Freq: Two times a day (BID) | NASAL | 12 refills | Status: DC

## 2018-06-12 MED ORDER — IPRATROPIUM BROMIDE 0.06 % NA SOLN: 2.0000 | Freq: Four times a day (QID) | NASAL | 12 refills | Status: DC

## 2018-06-12 NOTE — Telephone Encounter (Signed)
Atrovent sent to pharmacy.

## 2018-06-12 NOTE — Telephone Encounter (Signed)
Please advise.  Pharmacy states Medicaid will not cover.  Appears Atrovent may be covered.

## 2018-06-12 NOTE — Progress Notes (Signed)
    Chief Complaint:  Mary Cochran is a 23 y.o. female who presents today for a virtual office visit with a chief complaint of sore throat.   Assessment/Plan:  SInusitis Likely secondary to viral URI. No signs of bacterial infection.  No signs of COVID19. Start astelin for rhinorrhea/sinus congestion. Sent in a "pocket prescription" for cefdinir with strict instruction to not start unless symptoms worsen or fail to improve within the next several days. Recommended tylenol as needed for low grade fever and pain. Encouraged good oral hydration. Return precautions reviewed. Follow up as needed.     Subjective:  HPI:  Sore Throat  Started 2 days ago. Worsened over that time.  Associated with nasal congestion, ear pressure, and cough.  No fevers or chills.  No malaise.  No known sick contacts.  Had similar episode about a month ago that resolved with Ceftin ear.  No other specific treatments tried.  She is [redacted] weeks pregnant.  No other obvious alleviating or aggravating factors.   ROS: Per HPI  PMH: She reports that she has never smoked. She has never used smokeless tobacco. She reports that she does not drink alcohol or use drugs.      Objective/Observations  Physical Exam: Gen: NAD, resting comfortably HEENT: Oropharynx erythematous with no exudate. Pulm: Normal work of breathing Neuro: Grossly normal, moves all extremities Psych: Normal affect and thought content  Virtual Visit via Video   I connected with Mary Cochran on 06/12/18 at  2:20 PM EDT by a video enabled telemedicine application and verified that I am speaking with the correct person using two identifiers. I discussed the limitations of evaluation and management by telemedicine and the availability of in person appointments. The patient expressed understanding and agreed to proceed.   Patient location: Home Provider location: [place] participating in the virtual visit: Myself and  Patient     Algis Greenhouse. Jerline Pain, MD 06/12/2018 2:34 PM

## 2018-06-12 NOTE — Telephone Encounter (Signed)
Ok with atrovent.  Algis Greenhouse. Jerline Pain, MD 06/12/2018 2:56 PM

## 2018-07-22 DIAGNOSIS — Z3685 Encounter for antenatal screening for Streptococcus B: Secondary | ICD-10-CM | POA: Diagnosis not present

## 2018-07-22 DIAGNOSIS — Z3483 Encounter for supervision of other normal pregnancy, third trimester: Secondary | ICD-10-CM | POA: Diagnosis not present

## 2018-08-05 DIAGNOSIS — Z6791 Unspecified blood type, Rh negative: Secondary | ICD-10-CM | POA: Diagnosis not present

## 2018-09-16 DIAGNOSIS — Z130 Encounter for screening for diseases of the blood and blood-forming organs and certain disorders involving the immune mechanism: Secondary | ICD-10-CM | POA: Diagnosis not present

## 2018-10-17 DIAGNOSIS — R140 Abdominal distension (gaseous): Secondary | ICD-10-CM | POA: Diagnosis not present

## 2018-10-17 DIAGNOSIS — R197 Diarrhea, unspecified: Secondary | ICD-10-CM | POA: Diagnosis not present

## 2018-10-17 DIAGNOSIS — R1030 Lower abdominal pain, unspecified: Secondary | ICD-10-CM | POA: Diagnosis not present

## 2018-11-04 ENCOUNTER — Encounter (HOSPITAL_COMMUNITY): Payer: Self-pay

## 2018-11-21 ENCOUNTER — Other Ambulatory Visit: Payer: Self-pay | Admitting: Physician Assistant

## 2018-12-10 ENCOUNTER — Ambulatory Visit (INDEPENDENT_AMBULATORY_CARE_PROVIDER_SITE_OTHER): Payer: Medicaid Other | Admitting: Physician Assistant

## 2018-12-10 ENCOUNTER — Other Ambulatory Visit: Payer: Self-pay

## 2018-12-10 ENCOUNTER — Encounter: Payer: Self-pay | Admitting: Physician Assistant

## 2018-12-10 VITALS — BP 100/60 | HR 77 | Temp 98.4°F | Ht 67.0 in | Wt 119.2 lb

## 2018-12-10 DIAGNOSIS — L03031 Cellulitis of right toe: Secondary | ICD-10-CM | POA: Diagnosis not present

## 2018-12-10 MED ORDER — MUPIROCIN 2 % EX OINT: TOPICAL_OINTMENT | CUTANEOUS | 0 refills | Status: DC

## 2018-12-10 NOTE — Patient Instructions (Signed)
Use the ointment we prescribed and try epsom salt soaks.   Follow these instructions at home: Foot care   Do not pick at your toenail or try to remove it yourself.  Soak your foot in warm, soapy water. Do this for 20 minutes, 3 times a day, or as often as told by your health care provider. This helps to keep your toe clean and keep your skin soft.  Wear shoes that fit well and are not too tight. Your health care provider may recommend that you wear open-toed shoes while you heal.  Trim your toenails regularly and carefully. Cut your toenails straight across to prevent injury to the skin at the corners of the toenail. Do not cut your nails in a curved shape.  Keep your feet clean and dry to help prevent infection.

## 2018-12-10 NOTE — Progress Notes (Signed)
Mary Cochran is a 23 y.o. female here for a new problem.  I acted as a Education administrator for [employer], PA-C Anselmo Pickler, LPN  History of Present Illness:   Chief Complaint  Patient presents with  . Great toe pain    HPI   Great toe pain Pt c/o Rt great toe pain, had ingrown toe nail she dug out 3 weeks ago and then went for pedicure and it was dug out again. The toe is painful and red and draining pus colored drainage.  She denies fever, chills, nausea, vomiting, malaise.  She has a history of ingrown toenails.  Past Medical History:  Diagnosis Date  . ALLERGIC RHINITIS 12/12/2006  . Allergy    SEASONAL  . Anxiety   . Depression 10/03/2011  . GILBERT'S SYNDROME 03/07/2010  . Headache    Migraines  . IBS (irritable bowel syndrome) 10/04/2015  . INSOMNIA-SLEEP DISORDER-UNSPEC 04/18/2007  . Tubular adenoma of colon 10/2014     Social History   Socioeconomic History  . Marital status: Married    Spouse name: Not on file  . Number of children: Not on file  . Years of education: Not on file  . Highest education level: Not on file  Occupational History  . Occupation: Social worker  Social Needs  . Financial resource strain: Not on file  . Food insecurity    Worry: Not on file    Inability: Not on file  . Transportation needs    Medical: Not on file    Non-medical: Not on file  Tobacco Use  . Smoking status: Never Smoker  . Smokeless tobacco: Never Used  Substance and Sexual Activity  . Alcohol use: No    Alcohol/week: 0.0 standard drinks    Comment: OCC.  . Drug use: No  . Sexual activity: Yes    Birth control/protection: None  Lifestyle  . Physical activity    Days per week: Not on file    Minutes per session: Not on file  . Stress: Not on file  Relationships  . Social Herbalist on phone: Not on file    Gets together: Not on file    Attends religious service: Not on file    Active member of club or organization: Not on file    Attends  meetings of clubs or organizations: Not on file    Relationship status: Not on file  . Intimate partner violence    Fear of current or ex partner: Not on file    Emotionally abused: Not on file    Physically abused: Not on file    Forced sexual activity: Not on file  Other Topics Concern  . 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 MOUTH SURGERY    . TONSILLECTOMY      Family History  Problem Relation Age of Onset  . Breast cancer Maternal Grandmother   . Heart disease Maternal Grandmother   . Miscarriages / Stillbirths Maternal Grandmother   . Pancreatic cancer Other   . Kidney cancer Maternal Aunt   . Heart disease Father   . Heart disease Maternal Grandfather   . Varicose Veins Maternal Grandfather   . Varicose Veins Mother     Allergies  Allergen Reactions  . Amoxicillin Hives  . Azithromycin Other (See Comments)    Severe stomach ache  .  Hydrocodone Other (See Comments)    GI upset  . Morphine     Unknown, childhood reaction  . Ciprofloxacin Hcl Hives    Current Medications:   Current Outpatient Medications:  .  acetaminophen (TYLENOL) 325 MG tablet, Take 2 tablets (650 mg total) by mouth every 6 (six) hours as needed (for pain scale < 4)., Disp: 60 tablet, Rfl: 0 .  azelastine (ASTELIN) 0.1 % nasal spray, PLACE 2 SPRAYS INTO BOTH NOSTRILS 2 (TWO) TIMES DAILY. USE IN EACH NOSTRIL AS DIRECTED, Disp: 30 mL, Rfl: 12 .  ipratropium (ATROVENT) 0.06 % nasal spray, PLACE 2 SPRAYS INTO BOTH NOSTRILS 4 (FOUR) TIMES DAILY., Disp: 15 mL, Rfl: 12 .  LO LOESTRIN FE 1 MG-10 MCG / 10 MCG tablet, Take 1 tablet by mouth daily., Disp: , Rfl:  .  sertraline (ZOLOFT) 25 MG tablet, TAKE ONE TABLET BY MOUTH EVERY DAY, Disp: 90 tablet, Rfl: 2 .  mupirocin ointment (BACTROBAN) 2 %, Apply to affected area 1-2 times daily, Disp: 22 g, Rfl: 0   Review of  Systems:   ROS  Negative unless otherwise specified per HPI.  Vitals:   Vitals:   12/10/18 0950  BP: 100/60  Pulse: 77  Temp: 98.4 F (36.9 C)  TempSrc: Temporal  SpO2: 97%  Weight: 119 lb 4 oz (54.1 kg)  Height: 5\' 7"  (1.702 m)     Body mass index is 18.68 kg/m.  Physical Exam:   Physical Exam Vitals signs and nursing note reviewed.  Constitutional:      General: She is not in acute distress.    Appearance: She is well-developed. She is not ill-appearing or toxic-appearing.  Cardiovascular:     Rate and Rhythm: Normal rate and regular rhythm.     Pulses: Normal pulses.     Heart sounds: Normal heart sounds, S1 normal and S2 normal.     Comments: No LE edema Pulmonary:     Effort: Pulmonary effort is normal.     Breath sounds: Normal breath sounds.  Skin:    General: Skin is warm and dry.     Comments: Erythema, swelling and tenderness to distal nail fold; slight fluctuance, no induration and no active discharge  Neurological:     Mental Status: She is alert.     GCS: GCS eye subscore is 4. GCS verbal subscore is 5. GCS motor subscore is 6.  Psychiatric:        Speech: Speech normal.        Behavior: Behavior normal. Behavior is cooperative.      Assessment and Plan:   Mary Cochran was seen today for great toe pain.  Diagnoses and all orders for this visit:  Paronychia of great toe of right foot  Other orders -     mupirocin ointment (BACTROBAN) 2 %; Apply to affected area 1-2 times daily   No indications to do any procedure today in office.  Will trial topical Bactroban to area and recommended Epsom salt water soaks.  Worsening precautions advised and instructions on further toenail hygiene given.  . Reviewed expectations re: course of current medical issues. . Discussed self-management of symptoms. . Outlined signs and symptoms indicating need for more acute intervention. . Patient verbalized understanding and all questions were answered. . See orders  for this visit as documented in the electronic medical record. . Patient received an After-Visit Summary.  CMA or LPN served as scribe during this visit. History, Physical, and Plan performed by medical provider. The above documentation  has been reviewed and is accurate and complete.   [name], PA-C

## 2019-01-02 ENCOUNTER — Encounter: Payer: Self-pay | Admitting: Physician Assistant

## 2019-02-03 ENCOUNTER — Encounter: Payer: Self-pay | Admitting: Physician Assistant

## 2019-02-03 ENCOUNTER — Ambulatory Visit (INDEPENDENT_AMBULATORY_CARE_PROVIDER_SITE_OTHER): Payer: Medicaid Other | Admitting: Physician Assistant

## 2019-02-03 VITALS — Ht 67.0 in | Wt 121.0 lb

## 2019-02-03 DIAGNOSIS — F418 Other specified anxiety disorders: Secondary | ICD-10-CM

## 2019-02-03 DIAGNOSIS — O99345 Other mental disorders complicating the puerperium: Secondary | ICD-10-CM

## 2019-02-03 MED ORDER — SERTRALINE HCL 50 MG PO TABS: 50.0000 mg | ORAL_TABLET | Freq: Every day | ORAL | 0 refills | Status: DC

## 2019-02-03 NOTE — Progress Notes (Signed)
Virtual Visit via Video   I connected with Mary Cochran on 02/03/19 at 11:00 AM EST by a video enabled telemedicine application and verified that I am speaking with the correct person using two identifiers. Location patient: Home Location provider: [facility], Office Persons participating in the virtual visit: KHARMA MESSIMER, [name] PA-C, Anselmo Pickler, LPN   I discussed the limitations of evaluation and management by telemedicine and the availability of in person appointments. The patient expressed understanding and agreed to proceed.  I acted as a Education administrator for [employer], PA-C [employer], LPN  Subjective:   HPI:   Anxiety & Depression Pt c/o increase in anxiety & depression the past 6 months since delivery of last baby in June. Pt says anxiety is worse, specifically about her kids. Having racing heart, feeling on edge, getting snappy with kids and husband. She restarted therapy, currently taking. Has significantly worsened in the past month. Has appointment with her ob-gyn to see if maybe her OCPs are causing this mood fluctuation. Difficulty sleeping.   Currently on Zoloft 25 mg daily and tolerating well.  ROS: See pertinent positives and negatives per HPI.  Patient Active Problem List   Diagnosis Date Noted  . Nonallopathic lesion of sacral region 10/26/2015  . Nonallopathic lesion of thoracic region 10/26/2015  . Nonallopathic lesion of lumbosacral region 10/26/2015  . Scapular dysfunction 10/26/2015  . IBS (irritable bowel syndrome) 10/04/2015  . Adenomatous polyp of colon 10/04/2015  . Polyarthralgia 10/04/2015  . Eczema of both hands 09/30/2013  . Skin lesion 10/16/2012  . Vertigo 04/06/2012  . Migraine with aura 04/06/2012  . Anxiety 10/30/2011  . Depression 10/03/2011  . GILBERT'S SYNDROME 03/07/2010  . SYNCOPE 10/07/2008  . INSOMNIA-SLEEP DISORDER-UNSPEC 04/18/2007  . ALLERGIC RHINITIS 12/12/2006    Social History   Tobacco Use    . Smoking status: Never Smoker  . Smokeless tobacco: Never Used  Substance Use Topics  . Alcohol use: No    Alcohol/week: 0.0 standard drinks    Comment: OCC.    Current Outpatient Medications:  .  acetaminophen (TYLENOL) 325 MG tablet, Take 2 tablets (650 mg total) by mouth every 6 (six) hours as needed (for pain scale < 4)., Disp: 60 tablet, Rfl: 0 .  LO LOESTRIN FE 1 MG-10 MCG / 10 MCG tablet, Take 1 tablet by mouth daily., Disp: , Rfl:  .  sertraline (ZOLOFT) 25 MG tablet, TAKE ONE TABLET BY MOUTH EVERY DAY, Disp: 90 tablet, Rfl: 2  Allergies  Allergen Reactions  . Amoxicillin Hives  . Azithromycin Other (See Comments)    Severe stomach ache  . Hydrocodone Other (See Comments)    GI upset  . Morphine     Unknown, childhood reaction  . Ciprofloxacin Hcl Hives    Objective:   VITALS: Per patient if applicable, see vitals. GENERAL: Alert, appears well and in no acute distress. HEENT: Atraumatic, conjunctiva clear, no obvious abnormalities on inspection of external nose and ears. NECK: Normal movements of the head and neck. CARDIOPULMONARY: No increased WOB. Speaking in clear sentences. I:E ratio WNL.  MS: Moves all visible extremities without noticeable abnormality. PSYCH: Pleasant and cooperative, well-groomed. Speech normal rate and rhythm. Affect is appropriate. Insight and judgement are appropriate. Attention is focused, linear, and appropriate.  NEURO: CN grossly intact. Oriented as arrived to appointment on time with no prompting. Moves both UE equally.  SKIN: No obvious lesions, wounds, erythema, or cyanosis noted on face or hands.  Assessment and  Plan:   [name] was seen today for depression and anxiety.  Diagnoses and all orders for this visit:  Postpartum anxiety   Uncontrolled. Increase Zoloft to 50 mg daily. Follow-up in 1 month, sooner if concerns. Continue talk therapy. Denies SI/HI at this time. I discussed with patient that if they develop any SI, to  tell someone immediately and seek medical attention.   . Reviewed expectations re: course of current medical issues. . Discussed self-management of symptoms. . Outlined signs and symptoms indicating need for more acute intervention. . Patient verbalized understanding and all questions were answered. Marland Kitchen Health Maintenance issues including appropriate healthy diet, exercise, and smoking avoidance were discussed with patient. . See orders for this visit as documented in the electronic medical record.  I discussed the assessment and treatment plan with the patient. The patient was provided an opportunity to ask questions and all were answered. The patient agreed with the plan and demonstrated an understanding of the instructions.   The patient was advised to call back or seek an in-person evaluation if the symptoms worsen or if the condition fails to improve as anticipated.   CMA or LPN served as scribe during this visit. History, Physical, and Plan performed by medical provider. The above documentation has been reviewed and is accurate and complete.  [place], [place] 02/03/2019

## 2019-02-16 DIAGNOSIS — Z3009 Encounter for other general counseling and advice on contraception: Secondary | ICD-10-CM | POA: Diagnosis not present

## 2019-03-17 DIAGNOSIS — O906 Postpartum mood disturbance: Secondary | ICD-10-CM | POA: Diagnosis not present

## 2019-04-12 ENCOUNTER — Encounter: Payer: Self-pay | Admitting: Physician Assistant

## 2019-05-12 ENCOUNTER — Encounter: Payer: Self-pay | Admitting: Neurology

## 2019-05-12 ENCOUNTER — Telehealth: Payer: Self-pay | Admitting: Neurology

## 2019-05-12 NOTE — Telephone Encounter (Signed)
Returning a call about her appt for 05-13-19 please call

## 2019-05-12 NOTE — Progress Notes (Signed)
Virtual Visit via Video Note The purpose of this virtual visit is to provide medical care while limiting exposure to the novel coronavirus.    Consent was obtained for video visit:  Yes.   Answered questions that patient had about telehealth interaction:  Yes.   I discussed the limitations, risks, security and privacy concerns of performing an evaluation and management service by telemedicine. I also discussed with the patient that there may be a patient responsible charge related to this service. The patient expressed understanding and agreed to proceed.  Pt location: Home Physician Location: office Name of referring provider:  [name], [place] I connected with Lenise Arena Frerking at patients initiation/request on 05/13/2019 at  9:10 AM EDT by video enabled telemedicine application and verified that I am speaking with the correct person using two identifiers. Pt MRN:  [id number] Pt DOB:  [birth year] Video Participants:  Lenise Arena Garron   History of Present Illness:  Mary Cochran is a 24 year old right-handed female with Gilbert's syndrome, IBS and depression and anxiety who follows up for migraine.  UPDATE: Last seen in February 2017.  Migraines resolved but they started to return about year ago, while still pregnant with her son. Intensity:  severe Duration:  3 to 4 hours Frequency:  Now occurring once a month Sometimes she may have just visual aura without headache. Rescue protocol:  Two Tylenol or two ibuprofen with essential oils or rest in bathtub or in bed and rest. Current NSAIDS:  none Current analgesics:  acetaminophen Current triptans:  none Current anti-nausea:  no Current muscle relaxants: none Current Antihypertensive medications:  no Current Antidepressant medications:  sertraline 50mg  at bedtime Current Anticonvulsant medications:  no Current Vitamins/Herbal/Supplements:  MVI Current Antihistamines/Decongestants:  no Birth control: None  Caffeine:   Hot tea daily.  Tries to avoid due to IBS Alcohol:  no Smoker:  no Diet:  Tries to eat healthy.  Drinks water Exercise:  no Depression/stress:  varies Sleep hygiene:  good She is not breastfeeding.  HISTORY: Onset: Around age 66.  Location: holocephalic Quality: pressure Initial intensity: 8/10 Aura: Preceded by visual aura "like a lava lamp" in the temporal half of visual field in right eye, lasting 30 to 60 minutes Prodrome: no Associated symptoms: Photophobia, phonophobia, sometimes nausea. No vomiting. Initial Duration: 2-3 hours Initial Frequency: 2 to 4 days a month Triggers/exacerbating factors: Stress, neck and shoulder pain Relieving factors: Laying down in dark, cold compress on back of head while sitting in warm bath Activity: Cannot function  12/19/2014 MRI BRAIN WO (performed due to worsening headache):  Negative.  Past NSAIDs:  Advil; naproxen; ketorolac Past analgesics:  Excedrin Past triptans:  Sumatriptan 100mg ; Maxalt 10mg ; Zomig 5mg  NS Past anti-emetic:  Zofran ODT 8mg  Past muscle relaxant:  tizanidine Past antihypertensive:  None Past antidepressant:  Lexapro Past antiepileptic:  topiramate (increased headache) Past CGRP inhibitor:  None  Family history of headache: Mother   Past Medical History: Past Medical History:  Diagnosis Date  . ALLERGIC RHINITIS 12/12/2006  . Allergy    SEASONAL  . Anxiety   . Depression 10/03/2011  . GILBERT'S SYNDROME 03/07/2010  . Headache    Migraines  . IBS (irritable bowel syndrome) 10/04/2015  . INSOMNIA-SLEEP DISORDER-UNSPEC 04/18/2007  . Tubular adenoma of colon 10/2014    Medications: Outpatient Encounter Medications as of 05/13/2019  Medication Sig  . acetaminophen (TYLENOL) 325 MG tablet Take 2 tablets (650 mg total) by mouth every 6 (six) hours as needed (for pain  scale < 4).  . LO LOESTRIN FE 1 MG-10 MCG / 10 MCG tablet Take 1 tablet by mouth daily.  . sertraline (ZOLOFT) 50 MG tablet Take  1 tablet (50 mg total) by mouth at bedtime.   No facility-administered encounter medications on file as of 05/13/2019.    Allergies: Allergies  Allergen Reactions  . Amoxicillin Hives  . Azithromycin Other (See Comments)    Severe stomach ache  . Hydrocodone Other (See Comments)    GI upset  . Morphine     Unknown, childhood reaction  . Ciprofloxacin Hcl Hives    Family History: Family History  Problem Relation Age of Onset  . Breast cancer Maternal Grandmother   . Heart disease Maternal Grandmother   . Miscarriages / Stillbirths Maternal Grandmother   . Pancreatic cancer Other   . Kidney cancer Maternal Aunt   . Heart disease Father   . Heart disease Maternal Grandfather   . Varicose Veins Maternal Grandfather   . Varicose Veins Mother     Social History: Social History   Socioeconomic History  . Marital status: Married    Spouse name: Not on file  . Number of children: Not on file  . Years of education: Not on file  . Highest education level: Not on file  Occupational History  . Occupation: Social worker  Tobacco Use  . Smoking status: Never Smoker  . Smokeless tobacco: Never Used  Substance and Sexual Activity  . Alcohol use: No    Alcohol/week: 0.0 standard drinks    Comment: OCC.  . Drug use: No  . Sexual activity: Yes    Birth control/protection: None  Other Topics Concern  . 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 Difficulty of Paying Living Expenses:   Food Insecurity:   . Worried About Charity fundraiser in the Last Year:   . Arboriculturist in the Last Year:   Transportation Needs:   . Film/video editor (Medical):   Marland Kitchen Lack of Transportation (Non-Medical):   Physical Activity:   . Days of Exercise per Week:   . Minutes of Exercise per Session:   Stress:   . Feeling of Stress :   Social  Connections:   . Frequency of Communication with Friends and Family:   . Frequency of Social Gatherings with Friends and Family:   . Attends Religious Services:   . Active Member of Clubs or Organizations:   . Attends Archivist Meetings:   Marland Kitchen Marital Status:   Intimate Partner Violence:   . Fear of Current or Ex-Partner:   . Emotionally Abused:   Marland Kitchen Physically Abused:   . Sexually Abused:     Observations/Objective:   Height 5\' 7"  (1.702 m), weight 115 lb (52.2 kg), not currently breastfeeding. No acute distress.  Alert and oriented.  Speech fluent and not dysarthric.  Language intact.  Eyes orthophoric on primary gaze.  Face symmetric.  Assessment and Plan:   Migraine with aura, without status migrainosus, not intractable  1.  As they are still infrequent, will defer preventative medication for now. 2.  For abortive therapy, Ubrelvy 100mg  3.  Limit use of pain relievers to no more than 2 days out of week to prevent risk of rebound or medication-overuse headache. 4.  Keep headache diary 5.  Exercise, hydration,  caffeine cessation, sleep hygiene, monitor for and avoid triggers 6. Follow up 4 months.   Follow Up Instructions:    -I discussed the assessment and treatment plan with the patient. The patient was provided an opportunity to ask questions and all were answered. The patient agreed with the plan and demonstrated an understanding of the instructions.   The patient was advised to call back or seek an in-person evaluation if the symptoms worsen or if the condition fails to improve as anticipated.  Dudley Major, DO

## 2019-05-12 NOTE — Telephone Encounter (Signed)
Spoke with patient.

## 2019-05-13 ENCOUNTER — Other Ambulatory Visit: Payer: Self-pay

## 2019-05-13 ENCOUNTER — Encounter: Payer: Self-pay | Admitting: *Deleted

## 2019-05-13 ENCOUNTER — Telehealth (INDEPENDENT_AMBULATORY_CARE_PROVIDER_SITE_OTHER): Payer: Medicaid Other | Admitting: Neurology

## 2019-05-13 VITALS — Ht 67.0 in | Wt 115.0 lb

## 2019-05-13 DIAGNOSIS — G43109 Migraine with aura, not intractable, without status migrainosus: Secondary | ICD-10-CM | POA: Diagnosis not present

## 2019-05-13 MED ORDER — UBRELVY 100 MG PO TABS: 1.0000 | ORAL_TABLET | ORAL | 11 refills | Status: DC | PRN

## 2019-05-13 NOTE — Progress Notes (Addendum)
Called [phone] [facility] Call interaction ID [phone] spoke with Beau Fanny to initiate PA  PA# [id number]  Sent for review call back for status in 24 hours  Confirmation #:[id number] PBenefit Plan:MCAIDHealth Plan:[employer] Prior Approval [id number] PA Type:PHARMACYRecipient:Kailey DAMERONRecipient [id number] RBilling Provider:Billing Provider [id number] Provider Geanie Logan JAFFERequesting Provider [id number] Submission Date:04/07/2021Status:APPROVEDEffective Begin Date:04/07/2021Effective End Date:04/02/2022Payer:[facility] BENEFITS# of Attachments:0

## 2019-08-06 DIAGNOSIS — Z419 Encounter for procedure for purposes other than remedying health state, unspecified: Secondary | ICD-10-CM | POA: Diagnosis not present

## 2019-09-06 DIAGNOSIS — Z419 Encounter for procedure for purposes other than remedying health state, unspecified: Secondary | ICD-10-CM | POA: Diagnosis not present

## 2019-09-23 NOTE — Progress Notes (Signed)
NEUROLOGY FOLLOW UP OFFICE NOTE  Mary Cochran [id number]  HISTORY OF PRESENT ILLNESS: Mary Cochran is a 24 year old right-handed female with Gilbert's syndrome, IBS and depression and anxiety who follows up for migraines  UPDATE: Migraines are moderate to severe, lasting 3 to 4 hours and occurring about once a month.  She hasn't tried the [place] yet.  She does have mild headaches 3 to 4 days a month, associated with neck and bilateral shoulder/upper back pain.  Rescue protocol:  Two Tylenol or two ibuprofen with essential oils or rest in bathtub or in bed and rest. Current NSAIDS: none Current analgesics: acetaminophen Current triptans: none Current anti-nausea: no Current muscle relaxants: none Current Antihypertensive medications: no Current Antidepressant medications: sertraline 50mg  at bedtime Current Anticonvulsant medications: no Current CGRP inhibitor:  Ubrelvy 100mg  Current Vitamins/Herbal/Supplements: MVI Current Antihistamines/Decongestants: no Birth control: None  Caffeine: Hot tea daily. Tries to avoid due to IBS Alcohol: no Smoker: no Diet: Tries to eat healthy. Drinks water Exercise: no Depression/stress: varies Sleep hygiene: good She is not breastfeeding.  HISTORY: Onset:Around age 54. Location:holocephalic Quality:pressure Initial intensity:8/10 Aura:Preceded by visual aura "like a lava lamp" in the temporal half of visual field in right eye, lasting 30 to 60 minutes Prodrome:no Associated symptoms:Photophobia, phonophobia, sometimes nausea.No vomiting. Initial Duration:2-3 hours Initial Frequency:2 to 4 days a month Triggers/exacerbating factors:Stress, neck and shoulder pain Relieving factors:Laying down in dark, cold compress on back of head while sitting in warm bath Activity:Cannot function Sometimes just visual aura without headache  12/19/2014 MRI BRAIN WO (performed due to  worsening headache):  Negative.  Past NSAIDs:  Advil; naproxen; ketorolac Past analgesics:  Excedrin Past triptans:  Sumatriptan 100mg ; Maxalt 10mg ; Zomig 5mg  NS Past anti-emetic:  Zofran ODT 8mg  Past muscle relaxant:  tizanidine Past antihypertensive:  None Past antidepressant:  Lexapro Past antiepileptic:  topiramate (increased headache) Past CGRP inhibitor:  None  Family history of headache:Mother  PAST MEDICAL HISTORY: Past Medical History:  Diagnosis Date  . ALLERGIC RHINITIS 12/12/2006  . Allergy    SEASONAL  . Anxiety   . Depression 10/03/2011  . GILBERT'S SYNDROME 03/07/2010  . Headache    Migraines  . IBS (irritable bowel syndrome) 10/04/2015  . INSOMNIA-SLEEP DISORDER-UNSPEC 04/18/2007  . Tubular adenoma of colon 10/2014    MEDICATIONS: Current Outpatient Medications on File Prior to Visit  Medication Sig Dispense Refill  . acetaminophen (TYLENOL) 325 MG tablet Take 2 tablets (650 mg total) by mouth every 6 (six) hours as needed (for pain scale < 4). 60 tablet 0  . Multiple Vitamin (MULTIVITAMIN PO) Take by mouth.    . sertraline (ZOLOFT) 50 MG tablet Take 1 tablet (50 mg total) by mouth at bedtime. 90 tablet 0  . Ubrogepant (UBRELVY) 100 MG TABS Take 1 tablet by mouth as needed (May repeat in 2 hours.  Maximum 2 tablets in 24 hours). 10 tablet 11   No current facility-administered medications on file prior to visit.    ALLERGIES: Allergies  Allergen Reactions  . Amoxicillin Hives  . Azithromycin Other (See Comments)    Severe stomach ache  . Hydrocodone Other (See Comments)    GI upset  . Morphine     Unknown, childhood reaction  . Ciprofloxacin Hcl Hives    FAMILY HISTORY: Family History  Problem Relation Age of Onset  . Breast cancer Maternal Grandmother   . Heart disease Maternal Grandmother   . Miscarriages / Stillbirths Maternal Grandmother   . Pancreatic cancer Other   .  Kidney cancer Maternal Aunt   . Heart disease Father   . Heart  disease Maternal Grandfather   . Varicose Veins Maternal Grandfather   . Varicose Veins Mother     SOCIAL HISTORY: Social History   Socioeconomic History  . Marital status: Married    Spouse name: Not on file  . Number of children: Not on file  . Years of education: Not on file  . Highest education level: Not on file  Occupational History  . Occupation: Social worker  Tobacco Use  . Smoking status: Never Smoker  . Smokeless tobacco: Never Used  Substance and Sexual Activity  . Alcohol use: No    Alcohol/week: 0.0 standard drinks    Comment: OCC.  . Drug use: No  . Sexual activity: Yes    Birth control/protection: None  Other Topics Concern  . 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 Difficulty of Paying Living Expenses:   Food Insecurity:   . Worried About Charity fundraiser in the Last Year:   . Arboriculturist in the Last Year:   Transportation Needs:   . Film/video editor (Medical):   Marland Kitchen Lack of Transportation (Non-Medical):   Physical Activity:   . Days of Exercise per Week:   . Minutes of Exercise per Session:   Stress:   . Feeling of Stress :   Social Connections:   . Frequency of Communication with Friends and Family:   . Frequency of Social Gatherings with Friends and Family:   . Attends Religious Services:   . Active Member of Clubs or Organizations:   . Attends Archivist Meetings:   Marland Kitchen Marital Status:   Intimate Partner Violence:   . Fear of Current or Ex-Partner:   . Emotionally Abused:   Marland Kitchen Physically Abused:   . Sexually Abused:     PHYSICAL EXAM: BP 105/67, P 86, O2 97, HT 5'7", WT 113.4 lb General: No acute distress.  Patient appears well-groomed.   Head:  Normocephalic/atraumatic Eyes:  Fundi examined but not visualized Neck: supple, no paraspinal tenderness, full range of  motion Heart:  Regular rate and rhythm Lungs:  Clear to auscultation bilaterally Back: No paraspinal tenderness Neurological Exam: alert and oriented to person, place, and time. Attention span and concentration intact, recent and remote memory intact, fund of knowledge intact.  Speech fluent and not dysarthric, language intact.  CN II-XII intact. Bulk and tone normal, muscle strength 5/5 throughout.  Sensation to light touch, temperature and vibration intact.  Deep tendon reflexes 2+ throughout, toes downgoing.  Finger to nose and heel to shin testing intact.  Gait normal, Romberg negative.  IMPRESSION: Migraine with aura, without status migrainosus, not intractable Tension-type headache with cervicalgia  PLAN: 1.  For abortive therapy, she will try Ubrelvy 100mg  2.  Refer to Dr. Hulan Saas of Sports Medicine for osteopathic manipulative therapy. 3.  Limit use of pain relievers to no more than 2 days out of week to prevent risk of rebound or medication-overuse headache. 4.  Keep headache diary 5.  Exercise, hydration, caffeine cessation, sleep hygiene, monitor for and avoid triggers 6.  Follow up 6 months.   Metta Clines, DO  CC:  [name], PA

## 2019-09-24 ENCOUNTER — Other Ambulatory Visit: Payer: Self-pay

## 2019-09-24 ENCOUNTER — Ambulatory Visit (INDEPENDENT_AMBULATORY_CARE_PROVIDER_SITE_OTHER): Payer: Medicaid Other | Admitting: Neurology

## 2019-09-24 ENCOUNTER — Encounter: Payer: Self-pay | Admitting: Neurology

## 2019-09-24 VITALS — BP 105/67 | HR 86 | Ht 67.0 in | Wt 113.4 lb

## 2019-09-24 DIAGNOSIS — G44219 Episodic tension-type headache, not intractable: Secondary | ICD-10-CM

## 2019-09-24 DIAGNOSIS — G43109 Migraine with aura, not intractable, without status migrainosus: Secondary | ICD-10-CM

## 2019-09-28 ENCOUNTER — Ambulatory Visit (INDEPENDENT_AMBULATORY_CARE_PROVIDER_SITE_OTHER): Payer: Medicaid Other | Admitting: Physician Assistant

## 2019-09-28 ENCOUNTER — Encounter: Payer: Self-pay | Admitting: Physician Assistant

## 2019-09-28 ENCOUNTER — Other Ambulatory Visit: Payer: Self-pay

## 2019-09-28 VITALS — BP 90/60 | HR 90 | Temp 98.5°F | Ht 67.0 in | Wt 113.2 lb

## 2019-09-28 DIAGNOSIS — R41840 Attention and concentration deficit: Secondary | ICD-10-CM

## 2019-09-28 NOTE — Patient Instructions (Signed)
It was great to see you!  You will be contacted about your referral to [facility].  Take care,  [name] PA-C

## 2019-09-28 NOTE — Progress Notes (Signed)
Mary Cochran is a 24 y.o. female here for a new problem.  I acted as a Education administrator for [employer], PA-C Anselmo Pickler, LPN   History of Present Illness:   Chief Complaint  Patient presents with  . Discuss ADHD    HPI   Discuss ADHD Pt would like to discuss ADHD, is having symptoms including lack of focus, trouble concentrating. Has several unfinished projects. When she was in grade school her teachers thought that she may have had ADD/ADHD but was never formally evaluated for this.  She works full-time but thankfully is able to do this on a very relaxed schedule. She is still having a difficult time focusing. She feels like her mind is easily overwhelmed.  Wt Readings from Last 5 Encounters:  09/28/19 113 lb 4 oz (51.4 kg)  09/24/19 113 lb 6.4 oz (51.4 kg)  05/12/19 115 lb (52.2 kg)  02/03/19 121 lb (54.9 kg)  12/10/18 119 lb 4 oz (54.1 kg)     Past Medical History:  Diagnosis Date  . ALLERGIC RHINITIS 12/12/2006  . Allergy    SEASONAL  . Anxiety   . Depression 10/03/2011  . GILBERT'S SYNDROME 03/07/2010  . Headache    Migraines  . IBS (irritable bowel syndrome) 10/04/2015  . INSOMNIA-SLEEP DISORDER-UNSPEC 04/18/2007  . Tubular adenoma of colon 10/2014     Social History   Tobacco Use  . Smoking status: Never Smoker  . Smokeless tobacco: Never Used  Substance Use Topics  . Alcohol use: No    Alcohol/week: 0.0 standard drinks    Comment: OCC.  . Drug use: No    Past Surgical History:  Procedure Laterality Date  . MOUTH SURGERY    . TONSILLECTOMY      Family History  Problem Relation Age of Onset  . Breast cancer Maternal Grandmother   . Heart disease Maternal Grandmother   . Miscarriages / Stillbirths Maternal Grandmother   . Pancreatic cancer Other   . Kidney cancer Maternal Aunt   . Heart disease Father   . Heart disease Maternal Grandfather   . Varicose Veins Maternal Grandfather   . Varicose Veins Mother     Allergies  Allergen Reactions   . Amoxicillin Hives  . Azithromycin Other (See Comments)    Severe stomach ache  . Hydrocodone Other (See Comments)    GI upset  . Morphine     Unknown, childhood reaction  . Ciprofloxacin Hcl Hives    Current Medications:   Current Outpatient Medications:  .  acetaminophen (TYLENOL) 325 MG tablet, Take 2 tablets (650 mg total) by mouth every 6 (six) hours as needed (for pain scale < 4)., Disp: 60 tablet, Rfl: 0 .  Multiple Vitamin (MULTIVITAMIN PO), Take by mouth., Disp: , Rfl:  .  sertraline (ZOLOFT) 50 MG tablet, Take 1 tablet (50 mg total) by mouth at bedtime., Disp: 90 tablet, Rfl: 0 .  Ubrogepant (UBRELVY) 100 MG TABS, Take 1 tablet by mouth as needed (May repeat in 2 hours.  Maximum 2 tablets in 24 hours)., Disp: 10 tablet, Rfl: 11   Review of Systems:   ROS  Negative unless otherwise specified per HPI.  Vitals:   Vitals:   09/28/19 1536  BP: 90/60  Pulse: 90  Temp: 98.5 F (36.9 C)  TempSrc: Temporal  SpO2: 96%  Weight: 113 lb 4 oz (51.4 kg)  Height: 5\' 7"  (1.702 m)     Body mass index is 17.74 kg/m.  Physical Exam:   Physical  Exam Vitals and nursing note reviewed.  Constitutional:      General: She is not in acute distress.    Appearance: She is well-developed. She is not ill-appearing or toxic-appearing.  Cardiovascular:     Rate and Rhythm: Normal rate and regular rhythm.     Pulses: Normal pulses.     Heart sounds: Normal heart sounds, S1 normal and S2 normal.     Comments: No LE edema Pulmonary:     Effort: Pulmonary effort is normal.     Breath sounds: Normal breath sounds.  Skin:    General: Skin is warm and dry.  Neurological:     Mental Status: She is alert.     GCS: GCS eye subscore is 4. GCS verbal subscore is 5. GCS motor subscore is 6.  Psychiatric:        Speech: Speech normal.        Behavior: Behavior normal. Behavior is cooperative.     Results for orders placed or performed in visit on 04/29/18  POCT rapid strep A  Result  Value Ref Range   Rapid Strep A Screen Negative Negative    Assessment and Plan:   Jakyria was seen today for discuss adhd.  Diagnoses and all orders for this visit:  Attention and concentration deficit -     Ambulatory referral to Psychiatry   Possible ADHD -- referral to [facility].  . Reviewed expectations re: course of current medical issues. . Discussed self-management of symptoms. . Outlined signs and symptoms indicating need for more acute intervention. . Patient verbalized understanding and all questions were answered. . See orders for this visit as documented in the electronic medical record. . Patient received an After-Visit Summary.  CMA or LPN served as scribe during this visit. History, Physical, and Plan performed by medical provider. The above documentation has been reviewed and is accurate and complete.  [name], PA-C

## 2019-09-30 ENCOUNTER — Encounter: Payer: Self-pay | Admitting: Physician Assistant

## 2019-09-30 NOTE — Telephone Encounter (Signed)
Mary Cochran, so where are we sending her?

## 2019-09-30 NOTE — Telephone Encounter (Signed)
Noted

## 2019-09-30 NOTE — Telephone Encounter (Signed)
Please see message. °

## 2019-10-07 ENCOUNTER — Other Ambulatory Visit: Payer: Self-pay | Admitting: Physician Assistant

## 2019-10-07 MED ORDER — LISDEXAMFETAMINE DIMESYLATE 20 MG PO CAPS: 20.0000 mg | ORAL_CAPSULE | Freq: Every day | ORAL | 0 refills | Status: DC

## 2019-10-20 ENCOUNTER — Telehealth: Payer: Medicaid Other | Admitting: Physician Assistant

## 2019-10-21 ENCOUNTER — Telehealth (INDEPENDENT_AMBULATORY_CARE_PROVIDER_SITE_OTHER): Payer: Medicaid Other | Admitting: Physician Assistant

## 2019-10-21 ENCOUNTER — Encounter: Payer: Self-pay | Admitting: Physician Assistant

## 2019-10-21 VITALS — Ht 67.0 in | Wt 113.0 lb

## 2019-10-21 DIAGNOSIS — F419 Anxiety disorder, unspecified: Secondary | ICD-10-CM | POA: Diagnosis not present

## 2019-10-21 DIAGNOSIS — R41840 Attention and concentration deficit: Secondary | ICD-10-CM | POA: Diagnosis not present

## 2019-10-21 NOTE — Progress Notes (Signed)
Virtual Visit via Video   I connected with Mary Cochran on 10/21/19 at 12:00 PM EDT by a video enabled telemedicine application and verified that I am speaking with the correct person using two identifiers. Location patient: Home Location provider: [facility], Office Persons participating in the virtual visit: RUDINE RIEGER, Neema Fluegge PA-C,Donna [place], LPN   I discussed the limitations of evaluation and management by telemedicine and the availability of in person appointments. The patient expressed understanding and agreed to proceed.  I acted as a Education administrator for [employer], PA-C [employer], LPN   Subjective:   HPI:   ADHD and Anxiety Pt was started on Vyvanse 20 mg at last visit. She felt good first few days but then did not seem to help. She hasn't been taking it consistently because she has been dealing with a lot of stressful events and she wasn't sure if the medication was causing worsening anxiety.  She is taking zoloft 50 mg but admits that up until recently she was taking an old prescription of 25 mg daily. She has increased her medication to 50 mg a few days ago.  She had a panic attack on Sunday, felt overwhelmed. She had significant crying and feeling of hopelessness. She feels improved and has met with her therapist since then.  ROS: See pertinent positives and negatives per HPI.  Patient Active Problem List   Diagnosis Date Noted  . Nonallopathic lesion of sacral region 10/26/2015  . Nonallopathic lesion of thoracic region 10/26/2015  . Nonallopathic lesion of lumbosacral region 10/26/2015  . Scapular dysfunction 10/26/2015  . IBS (irritable bowel syndrome) 10/04/2015  . Adenomatous polyp of colon 10/04/2015  . Polyarthralgia 10/04/2015  . Eczema of both hands 09/30/2013  . Skin lesion 10/16/2012  . Vertigo 04/06/2012  . Migraine with aura 04/06/2012  . Anxiety 10/30/2011  . Depression 10/03/2011  . GILBERT'S SYNDROME 03/07/2010  .  SYNCOPE 10/07/2008  . INSOMNIA-SLEEP DISORDER-UNSPEC 04/18/2007  . ALLERGIC RHINITIS 12/12/2006    Social History   Tobacco Use  . Smoking status: Never Smoker  . Smokeless tobacco: Never Used  Substance Use Topics  . Alcohol use: No    Alcohol/week: 0.0 standard drinks    Comment: OCC.    Current Outpatient Medications:  .  acetaminophen (TYLENOL) 325 MG tablet, Take 2 tablets (650 mg total) by mouth every 6 (six) hours as needed (for pain scale < 4)., Disp: 60 tablet, Rfl: 0 .  lisdexamfetamine (VYVANSE) 20 MG capsule, Take 1 capsule (20 mg total) by mouth daily., Disp: 30 capsule, Rfl: 0 .  Multiple Vitamin (MULTIVITAMIN PO), Take by mouth., Disp: , Rfl:  .  sertraline (ZOLOFT) 50 MG tablet, Take 1 tablet (50 mg total) by mouth at bedtime., Disp: 90 tablet, Rfl: 0 .  Ubrogepant (UBRELVY) 100 MG TABS, Take 1 tablet by mouth as needed (May repeat in 2 hours.  Maximum 2 tablets in 24 hours)., Disp: 10 tablet, Rfl: 11  Allergies  Allergen Reactions  . Amoxicillin Hives  . Azithromycin Other (See Comments)    Severe stomach ache  . Hydrocodone Other (See Comments)    GI upset  . Morphine     Unknown, childhood reaction  . Ciprofloxacin Hcl Hives    Objective:   VITALS: Per patient if applicable, see vitals. GENERAL: Alert, appears well and in no acute distress. HEENT: Atraumatic, conjunctiva clear, no obvious abnormalities on inspection of external nose and ears. NECK: Normal movements of the head and neck.  CARDIOPULMONARY: No increased WOB. Speaking in clear sentences. I:E ratio WNL.  MS: Moves all visible extremities without noticeable abnormality. PSYCH: Pleasant and cooperative, well-groomed. Speech normal rate and rhythm. Affect is appropriate. Insight and judgement are appropriate. Attention is focused, linear, and appropriate.  NEURO: CN grossly intact. Oriented as arrived to appointment on time with no prompting. Moves both UE equally.  SKIN: No obvious lesions,  wounds, erythema, or cyanosis noted on face or hands.  Assessment and Plan:   [name] was seen today for adhd.  Diagnoses and all orders for this visit:  Anxiety Uncontrolled. Continue current dosage of zoloft 50 mg daily. I recommended giving this increased dose at least 2-3 weeks before changes to vyvanse. Follow-up in 1-2 months, sooner if concerns. Offered prn ativan however patient declined.  Attention and concentration deficit Difficult to assess as she is not taking her Vyvanse 20 mg consistently. Continue dosage increase of Zoloft and asked patient to send me a mychart message if she would like to trial Vyvanse 30 mg after being on Vyvanse 20 mg consistently with increased Zoloft dosage  I discussed the assessment and treatment plan with the patient. The patient was provided an opportunity to ask questions and all were answered. The patient agreed with the plan and demonstrated an understanding of the instructions.   The patient was advised to call back or seek an in-person evaluation if the symptoms worsen or if the condition fails to improve as anticipated.   CMA or LPN served as scribe during this visit. History, Physical, and Plan performed by medical provider. The above documentation has been reviewed and is accurate and complete.  [place], [place] 10/21/2019

## 2019-10-23 ENCOUNTER — Other Ambulatory Visit: Payer: Self-pay | Admitting: Physician Assistant

## 2019-10-23 MED ORDER — LORAZEPAM 0.5 MG PO TABS: 0.5000 mg | ORAL_TABLET | Freq: Two times a day (BID) | ORAL | 0 refills | Status: DC | PRN

## 2019-11-12 ENCOUNTER — Other Ambulatory Visit: Payer: Self-pay | Admitting: Physician Assistant

## 2019-11-12 NOTE — Telephone Encounter (Signed)
Pt requesting refill on Vyvanse. Last OV 9/21.

## 2019-12-18 ENCOUNTER — Other Ambulatory Visit: Payer: Self-pay | Admitting: Physician Assistant

## 2019-12-18 ENCOUNTER — Encounter: Payer: Self-pay | Admitting: Physician Assistant

## 2019-12-18 MED ORDER — LISDEXAMFETAMINE DIMESYLATE 20 MG PO CAPS: 20.0000 mg | ORAL_CAPSULE | Freq: Every day | ORAL | 0 refills | Status: DC

## 2019-12-18 NOTE — Telephone Encounter (Signed)
I am sending in 3 months of refills for her.

## 2019-12-18 NOTE — Telephone Encounter (Signed)
Requesting refill but pt sent message that pharmacy was refilling today for her. Not sure if you need to send in.

## 2019-12-21 NOTE — Telephone Encounter (Signed)
Samantha, please refuse medication you already sent in.

## 2020-01-15 ENCOUNTER — Other Ambulatory Visit: Payer: Self-pay | Admitting: Physician Assistant

## 2020-01-15 MED ORDER — LISDEXAMFETAMINE DIMESYLATE 30 MG PO CAPS: 30.0000 mg | ORAL_CAPSULE | Freq: Every day | ORAL | 0 refills | Status: DC

## 2020-02-02 ENCOUNTER — Encounter: Payer: Self-pay | Admitting: Physician Assistant

## 2020-02-02 NOTE — Telephone Encounter (Signed)
Pt scheduled  

## 2020-02-03 ENCOUNTER — Encounter: Payer: Self-pay | Admitting: Physician Assistant

## 2020-02-03 ENCOUNTER — Telehealth (INDEPENDENT_AMBULATORY_CARE_PROVIDER_SITE_OTHER): Payer: Medicaid Other | Admitting: Physician Assistant

## 2020-02-03 VITALS — Ht 67.0 in | Wt 110.0 lb

## 2020-02-03 DIAGNOSIS — U071 COVID-19: Secondary | ICD-10-CM

## 2020-02-03 NOTE — Progress Notes (Signed)
Virtual Visit via Video   I connected with Mary Cochran on 02/03/20 at 12:30 PM EST by a video enabled telemedicine application and verified that I am speaking with the correct person using two identifiers. Location patient: Home Location provider: [facility], Office Persons participating in the virtual visit: IVANNA WIEMER, [name] PA-C, Anselmo Pickler, LPN   I discussed the limitations of evaluation and management by telemedicine and the availability of in person appointments. The patient expressed understanding and agreed to proceed.  I acted as a Education administrator for [employer], PA-C [employer], LPN   Subjective:   HPI:   Patient is requesting evaluation for possible COVID-19.  Symptom onset: Week ago  Travel/contacts: thinks exposed from children, she had rapid test done on last Thursday and was positive and PCR test is not back yet.  Vaccination status: yes  Patient endorses the following symptoms: sinus congestion, rhinorrhea, ear fullness, sore throat, dry cough (non-productive), shortness of breath and myalgia, loss sense of taste and smell, brain fog  Symptoms are improving each day.  Patient denies the following symptoms: Fever (none), sinus headache, wheezing, chest tightness and chest pain  Treatments tried: Mucinex, Fast Max cold & Flu  Patient risk factors: Current [id number] risk of complications score: 0 Smoking status: [name]  reports that she has never smoked. She has never used smokeless tobacco. If female, currently pregnant? []   Yes [x]   No  ROS: See pertinent positives and negatives per HPI.  Patient Active Problem List   Diagnosis Date Noted  . Nonallopathic lesion of sacral region 10/26/2015  . Nonallopathic lesion of thoracic region 10/26/2015  . Nonallopathic lesion of lumbosacral region 10/26/2015  . Scapular dysfunction 10/26/2015  . IBS (irritable bowel syndrome) 10/04/2015  . Adenomatous polyp of colon  10/04/2015  . Polyarthralgia 10/04/2015  . Eczema of both hands 09/30/2013  . Skin lesion 10/16/2012  . Vertigo 04/06/2012  . Migraine with aura 04/06/2012  . Anxiety 10/30/2011  . Depression 10/03/2011  . GILBERT'S SYNDROME 03/07/2010  . SYNCOPE 10/07/2008  . INSOMNIA-SLEEP DISORDER-UNSPEC 04/18/2007  . ALLERGIC RHINITIS 12/12/2006    Social History   Tobacco Use  . Smoking status: Never Smoker  . Smokeless tobacco: Never Used  Substance Use Topics  . Alcohol use: No    Alcohol/week: 0.0 standard drinks    Comment: OCC.    Current Outpatient Medications:  .  acetaminophen (TYLENOL) 325 MG tablet, Take 2 tablets (650 mg total) by mouth every 6 (six) hours as needed (for pain scale < 4)., Disp: 60 tablet, Rfl: 0 .  lisdexamfetamine (VYVANSE) 30 MG capsule, Take 1 capsule (30 mg total) by mouth daily., Disp: 30 capsule, Rfl: 0 .  LORazepam (ATIVAN) 0.5 MG tablet, Take 1 tablet (0.5 mg total) by mouth 2 (two) times daily as needed for anxiety., Disp: 30 tablet, Rfl: 0 .  Multiple Vitamin (MULTIVITAMIN PO), Take by mouth., Disp: , Rfl:  .  sertraline (ZOLOFT) 50 MG tablet, Take 1 tablet (50 mg total) by mouth at bedtime., Disp: 90 tablet, Rfl: 0 .  Ubrogepant (UBRELVY) 100 MG TABS, Take 1 tablet by mouth as needed (May repeat in 2 hours.  Maximum 2 tablets in 24 hours)., Disp: 10 tablet, Rfl: 11  Allergies  Allergen Reactions  . Amoxicillin Hives  . Azithromycin Other (See Comments)    Severe stomach ache  . Hydrocodone Other (See Comments)    GI upset  . Morphine     Unknown, childhood  reaction  . Ciprofloxacin Hcl Hives    Objective:   VITALS: Per patient if applicable, see vitals. GENERAL: Alert, appears well and in no acute distress. HEENT: Atraumatic, conjunctiva clear, no obvious abnormalities on inspection of external nose and ears. NECK: Normal movements of the head and neck. CARDIOPULMONARY: No increased WOB. Speaking in clear sentences. I:E ratio WNL.  MS:  Moves all visible extremities without noticeable abnormality. PSYCH: Pleasant and cooperative, well-groomed. Speech normal rate and rhythm. Affect is appropriate. Insight and judgement are appropriate. Attention is focused, linear, and appropriate.  NEURO: CN grossly intact. Oriented as arrived to appointment on time with no prompting. Moves both UE equally.  SKIN: No obvious lesions, wounds, erythema, or cyanosis noted on face or hands.  Assessment and Plan:   [name] was seen today for covid positive.  Diagnoses and all orders for this visit:  COVID-19   No red flags on discussion, patient is not in any obvious distress during our visit. Discussed progression of most viral illness, and recommended supportive care at this point in time.  Discussed over the counter supportive care options, with recommendations to push fluids and rest. Continue Tylenol and Mucinex as already doing. Reviewed return precautions including new/worsening fever, SOB, new/worsening cough or other concerns.  Recommended need to self-quarantine and practice social distancing until symptoms resolve. Discussed current recommendations for COVID testing. I recommend that patient follow-up if symptoms worsen or persist despite treatment x 7-10 days, sooner if needed.  I discussed the assessment and treatment plan with the patient. The patient was provided an opportunity to ask questions and all were answered. The patient agreed with the plan and demonstrated an understanding of the instructions.   The patient was advised to call back or seek an in-person evaluation if the symptoms worsen or if the condition fails to improve as anticipated.   CMA or LPN served as scribe during this visit. History, Physical, and Plan performed by medical provider. The above documentation has been reviewed and is accurate and complete.   [place], [place] 02/03/2020

## 2020-02-28 ENCOUNTER — Encounter: Payer: Self-pay | Admitting: Physician Assistant

## 2020-02-29 ENCOUNTER — Other Ambulatory Visit: Payer: Self-pay | Admitting: Physician Assistant

## 2020-02-29 MED ORDER — LISDEXAMFETAMINE DIMESYLATE 30 MG PO CAPS: 30.0000 mg | ORAL_CAPSULE | Freq: Every day | ORAL | 0 refills | Status: DC

## 2020-03-11 ENCOUNTER — Other Ambulatory Visit: Payer: Self-pay

## 2020-03-11 ENCOUNTER — Telehealth (INDEPENDENT_AMBULATORY_CARE_PROVIDER_SITE_OTHER): Payer: Medicaid Other | Admitting: Physician Assistant

## 2020-03-11 ENCOUNTER — Encounter: Payer: Self-pay | Admitting: Physician Assistant

## 2020-03-11 DIAGNOSIS — R41840 Attention and concentration deficit: Secondary | ICD-10-CM | POA: Diagnosis not present

## 2020-03-11 MED ORDER — AMPHETAMINE-DEXTROAMPHET ER 10 MG PO CP24: 10.0000 mg | ORAL_CAPSULE | Freq: Every day | ORAL | 0 refills | Status: DC

## 2020-03-11 NOTE — Progress Notes (Signed)
Virtual Visit via Video   I connected with Mary Cochran on 03/11/20 at 11:00 AM EST by a video enabled telemedicine application and verified that I am speaking with the correct person using two identifiers. Location patient: Home Location provider: [facility], Office Persons participating in the virtual visit: VALA RAFFO, [name] PA-C  I discussed the limitations of evaluation and management by telemedicine and the availability of in person appointments. The patient expressed understanding and agreed to proceed.   Subjective:   HPI:   ADHD Currently taking 30 mg Vyvanse most days of the week. She is finding that she is possibly becoming more sensitive to this medication. She does drink coffee daily, but this is not new. She will sometimes develop increased feelings of anxiety around noon or so, with some feelings of SOB. This is only on days that she takes the Vyvanse. When she was on a lower dose she felt like it didn't manage her ADHD as well.  Denies SI/HI.  Continues on zoloft 50 mg daily.   ROS: See pertinent positives and negatives per HPI.  Patient Active Problem List   Diagnosis Date Noted  . Nonallopathic lesion of sacral region 10/26/2015  . Nonallopathic lesion of thoracic region 10/26/2015  . Nonallopathic lesion of lumbosacral region 10/26/2015  . Scapular dysfunction 10/26/2015  . IBS (irritable bowel syndrome) 10/04/2015  . Adenomatous polyp of colon 10/04/2015  . Polyarthralgia 10/04/2015  . Eczema of both hands 09/30/2013  . Skin lesion 10/16/2012  . Vertigo 04/06/2012  . Migraine with aura 04/06/2012  . Anxiety 10/30/2011  . Depression 10/03/2011  . GILBERT'S SYNDROME 03/07/2010  . SYNCOPE 10/07/2008  . INSOMNIA-SLEEP DISORDER-UNSPEC 04/18/2007  . ALLERGIC RHINITIS 12/12/2006    Social History   Tobacco Use  . Smoking status: Never Smoker  . Smokeless tobacco: Never Used  Substance Use Topics  . Alcohol use: No     Alcohol/week: 0.0 standard drinks    Comment: OCC.    Current Outpatient Medications:  .  acetaminophen (TYLENOL) 325 MG tablet, Take 2 tablets (650 mg total) by mouth every 6 (six) hours as needed (for pain scale < 4)., Disp: 60 tablet, Rfl: 0 .  amphetamine-dextroamphetamine (ADDERALL XR) 10 MG 24 hr capsule, Take 1 capsule (10 mg total) by mouth daily., Disp: 30 capsule, Rfl: 0 .  LORazepam (ATIVAN) 0.5 MG tablet, Take 1 tablet (0.5 mg total) by mouth 2 (two) times daily as needed for anxiety., Disp: 30 tablet, Rfl: 0 .  Multiple Vitamin (MULTIVITAMIN PO), Take by mouth., Disp: , Rfl:  .  sertraline (ZOLOFT) 50 MG tablet, Take 1 tablet (50 mg total) by mouth at bedtime., Disp: 90 tablet, Rfl: 0 .  Ubrogepant (UBRELVY) 100 MG TABS, Take 1 tablet by mouth as needed (May repeat in 2 hours.  Maximum 2 tablets in 24 hours)., Disp: 10 tablet, Rfl: 11  Allergies  Allergen Reactions  . Amoxicillin Hives  . Azithromycin Other (See Comments)    Severe stomach ache  . Hydrocodone Other (See Comments)    GI upset  . Morphine     Unknown, childhood reaction  . Ciprofloxacin Hcl Hives    Objective:   VITALS: Per patient if applicable, see vitals. GENERAL: Alert, appears well and in no acute distress. HEENT: Atraumatic, conjunctiva clear, no obvious abnormalities on inspection of external nose and ears. NECK: Normal movements of the head and neck. CARDIOPULMONARY: No increased WOB. Speaking in clear sentences. I:E ratio WNL.  MS: Moves  all visible extremities without noticeable abnormality. PSYCH: Pleasant and cooperative, well-groomed. Speech normal rate and rhythm. Affect is appropriate. Insight and judgement are appropriate. Attention is focused, linear, and appropriate.  NEURO: CN grossly intact. Oriented as arrived to appointment on time with no prompting. Moves both UE equally.  SKIN: No obvious lesions, wounds, erythema, or cyanosis noted on face or hands.  Assessment and Plan:    Jacquelinne was seen today for medication refill.  Diagnoses and all orders for this visit:  Attention and concentration deficit  Other orders -     amphetamine-dextroamphetamine (ADDERALL XR) 10 MG 24 hr capsule; Take 1 capsule (10 mg total) by mouth daily.   ADHD symptoms controlled, but side effects of medication are quite bothersome. Stop vyvanse and trial adderall xr 10 mg daily. Follow-up via mychart message in 1-2 weeks to let us know how she is doing, sooner if concerns.  I discussed the assessment and treatment plan with the patient. The patient was provided an opportunity to ask questions and all were answered. The patient agreed with the plan and demonstrated an understanding of the instructions.   The patient was advised to call back or seek an in-person evaluation if the symptoms worsen or if the condition fails to improve as anticipated.   [place], [place] 03/11/2020

## 2020-03-14 ENCOUNTER — Telehealth: Payer: Self-pay | Admitting: *Deleted

## 2020-03-14 NOTE — Telephone Encounter (Signed)
Received fax from pharmacy PA needed for Adderall XR 10 mg. PA done through [employer]. KeyCheryll Dessert - PA Case ID: [id number] - Rx #: [id number] Approved. This drug has been approved.

## 2020-03-14 NOTE — Telephone Encounter (Signed)
[employer] and spoke to [place], told her PA for Adderall was approved. Kaylee verbalized understanding. She ran Rx and it went through said she will get ready for patient. Told her okay.    Pt notified Rx for Adderal was approved, pharmacy notified and they are getting Rx ready for you. Pt verbalized understanding.

## 2020-03-16 ENCOUNTER — Other Ambulatory Visit: Payer: Self-pay

## 2020-03-16 ENCOUNTER — Ambulatory Visit (AMBULATORY_SURGERY_CENTER): Payer: Self-pay

## 2020-03-16 VITALS — Ht 67.0 in | Wt 110.0 lb

## 2020-03-16 DIAGNOSIS — Z86010 Personal history of colonic polyps: Secondary | ICD-10-CM

## 2020-03-16 MED ORDER — NA SULFATE-K SULFATE-MG SULF 17.5-3.13-1.6 GM/177ML PO SOLN: 1.0000 | Freq: Once | ORAL | 0 refills | Status: AC

## 2020-03-16 NOTE — Progress Notes (Signed)
No allergies to soy or egg Pt is not on blood thinners or diet pills Denies issues with sedation/intubation Denies atrial flutter/fib Denies constipation   Emmi instructions given to pt  Pt is aware of Covid safety and care partner requirements.  

## 2020-03-25 NOTE — Progress Notes (Deleted)
NEUROLOGY FOLLOW UP OFFICE NOTE  Mary Cochran [id number]   Subjective:  Mary Cochran is a 25year old right-handed female with Gilbert's syndrome, IBS and depression and anxiety who follows up for migraines  UPDATE: Migraines are moderate to severe, lasting 3 to 4 hours and occurring about once a month.  She hasn't tried the [place] yet.  She does have mild headaches 3 to 4 days a month, associated with neck and bilateral shoulder/upper back pain.   Rescue protocol: Two Tylenol or two ibuprofen with essential oils or rest in bathtub or in bed and rest. Current NSAIDS:none Current analgesics:acetaminophen Current triptans:none Current anti-nausea: no Current muscle relaxants:none Current Antihypertensive medications: no Current Antidepressant medications:sertraline 50mg  at bedtime Current Anticonvulsant medications: no Current CGRP inhibitor:  Ubrelvy 100mg  Current Vitamins/Herbal/Supplements: MVI Current Antihistamines/Decongestants: no Birth control: None  Caffeine: Hot tea daily. Tries to avoid due to IBS Alcohol: no Smoker: no Diet: Tries to eat healthy. Drinks water Exercise: no Depression/stress: varies Sleep hygiene: good She is not breastfeeding.  HISTORY: Onset:Around age 31. Location:holocephalic Quality:pressure Initial intensity:8/10 Aura:Preceded by visual aura "like a lava lamp" in the temporal half of visual field in right eye, lasting 30 to 60 minutes Prodrome:no Associated symptoms:Photophobia, phonophobia, sometimes nausea.No vomiting. Initial Duration:2-3 hours Initial Frequency:2 to 4 days a month Triggers/exacerbating factors:Stress, neck and shoulder pain Relieving factors:Laying down in dark, cold compress on back of head while sitting in warm bath Activity:Cannot function Sometimes just visual aura without headache  12/19/2014 MRI BRAIN WO (performed due to worsening  headache): Negative.  Past NSAIDs: Advil; naproxen; ketorolac Past analgesics: Excedrin Past triptans: Sumatriptan 100mg ; Maxalt 10mg ; Zomig 5mg  NS Past anti-emetic: Zofran ODT 8mg  Past muscle relaxant: tizanidine Past antihypertensive: None Past antidepressant: Lexapro Past antiepileptic: topiramate (increased headache) Past CGRP inhibitor: None  Family history of headache:Mother  PAST MEDICAL HISTORY: Past Medical History:  Diagnosis Date  . ALLERGIC RHINITIS 12/12/2006  . Allergy    SEASONAL  . Anxiety   . Depression 10/03/2011  . GILBERT'S SYNDROME 03/07/2010  . Headache    Migraines  . IBS (irritable bowel syndrome) 10/04/2015  . INSOMNIA-SLEEP DISORDER-UNSPEC 04/18/2007  . Tubular adenoma of colon 10/2014    MEDICATIONS: Current Outpatient Medications on File Prior to Visit  Medication Sig Dispense Refill  . acetaminophen (TYLENOL) 325 MG tablet Take 2 tablets (650 mg total) by mouth every 6 (six) hours as needed (for pain scale < 4). 60 tablet 0  . amphetamine-dextroamphetamine (ADDERALL XR) 10 MG 24 hr capsule Take 1 capsule (10 mg total) by mouth daily. 30 capsule 0  . LORazepam (ATIVAN) 0.5 MG tablet Take 1 tablet (0.5 mg total) by mouth 2 (two) times daily as needed for anxiety. (Patient not taking: Reported on 03/16/2020) 30 tablet 0  . Multiple Vitamin (MULTIVITAMIN PO) Take by mouth.    . Probiotic Product (PROBIOTIC PO) Take by mouth.    . sertraline (ZOLOFT) 50 MG tablet Take 1 tablet (50 mg total) by mouth at bedtime. 90 tablet 0  . Ubrogepant (UBRELVY) 100 MG TABS Take 1 tablet by mouth as needed (May repeat in 2 hours.  Maximum 2 tablets in 24 hours). 10 tablet 11   No current facility-administered medications on file prior to visit.    ALLERGIES: Allergies  Allergen Reactions  . Amoxicillin Hives  . Azithromycin Other (See Comments)    Severe stomach ache  . Hydrocodone Other (See Comments)    GI upset  . Morphine     Unknown,  childhood  reaction  . Ciprofloxacin Hcl Hives    FAMILY HISTORY: Family History  Problem Relation Age of Onset  . Breast cancer Maternal Grandmother   . Heart disease Maternal Grandmother   . Miscarriages / Stillbirths Maternal Grandmother   . Pancreatic cancer Other   . Kidney cancer Maternal Aunt   . Heart disease Father   . Heart disease Maternal Grandfather   . Varicose Veins Maternal Grandfather   . Varicose Veins Mother   . Colon polyps Mother   . Colon cancer Neg Hx   . Esophageal cancer Neg Hx   . Rectal cancer Neg Hx   . Stomach cancer Neg Hx      SOCIAL HISTORY: Social History   Socioeconomic History  . Marital status: Married    Spouse name: Not on file  . Number of children: Not on file  . Years of education: Not on file  . Highest education level: Not on file  Occupational History  . Occupation: Social worker  Tobacco Use  . Smoking status: Never Smoker  . Smokeless tobacco: Never Used  Vaping Use  . Vaping Use: Never used  Substance and Sexual Activity  . Alcohol use: Yes    Alcohol/week: 0.0 standard drinks    Comment: OCC.  . Drug use: No  . Sexual activity: Yes    Birth control/protection: None  Other Topics Concern  . 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  Patient appears well-groomed.   Head:  Normocephalic/atraumatic Eyes:  Fundi examined but not visualized Neck: supple, no paraspinal tenderness, full range of motion Heart:  Regular rate and rhythm Lungs:  Clear to auscultation bilaterally Back: No paraspinal tenderness Neurological Exam: alert and  oriented to person, place, and time. Attention span and concentration intact, recent and remote memory intact, fund of knowledge intact.  Speech fluent and not dysarthric, language intact.  CN II-XII intact. Bulk and tone normal, muscle strength 5/5 throughout.  Sensation to light touch, temperature and vibration intact.  Deep tendon reflexes 2+ throughout, toes downgoing.  Finger to nose and heel to shin testing intact.  Gait normal, Romberg negative.   Assessment/Plan:   1.  Migraine with aura, without status migrainosus, not intractable 2.  Tension-type headache, possibly cervicogenic  1.  ***  Metta Clines, DO  CC: Mary Coke, PA

## 2020-03-28 ENCOUNTER — Ambulatory Visit: Payer: Medicaid Other | Admitting: Neurology

## 2020-03-30 ENCOUNTER — Encounter: Payer: Self-pay | Admitting: Gastroenterology

## 2020-03-30 ENCOUNTER — Ambulatory Visit (AMBULATORY_SURGERY_CENTER): Payer: Medicaid Other | Admitting: Gastroenterology

## 2020-03-30 ENCOUNTER — Other Ambulatory Visit: Payer: Self-pay

## 2020-03-30 VITALS — BP 102/49 | HR 54 | Temp 98.6°F | Resp 14 | Ht 67.0 in | Wt 110.0 lb

## 2020-03-30 DIAGNOSIS — K635 Polyp of colon: Secondary | ICD-10-CM | POA: Diagnosis not present

## 2020-03-30 DIAGNOSIS — Z86010 Personal history of colonic polyps: Secondary | ICD-10-CM | POA: Diagnosis not present

## 2020-03-30 DIAGNOSIS — D123 Benign neoplasm of transverse colon: Secondary | ICD-10-CM

## 2020-03-30 MED ORDER — SODIUM CHLORIDE 0.9 % IV SOLN
500.0000 mL | Freq: Once | INTRAVENOUS | Status: DC
Start: 2020-03-30 — End: 2020-03-30

## 2020-03-30 NOTE — Progress Notes (Signed)
To PACU, VSS. Report to RN.tb 

## 2020-03-30 NOTE — Op Note (Signed)
[facility] Patient Name: Mary Cochran Procedure Date: 03/30/2020 11:45 AM MRN: [id number] Endoscopist: Ladene Artist , MD Age: 25 Referring MD:  Date of Birth: 1995-04-11 Gender: Female Account #: [id number] Procedure:                Colonoscopy Indications:              Surveillance: Personal history of adenomatous                            polyps on last colonoscopy > 5 years ago Medicines:                Monitored Anesthesia Care Procedure:                Pre-Anesthesia Assessment:                           - Prior to the procedure, a History and Physical                            was performed, and patient medications and                            allergies were reviewed. The patient's tolerance of                            previous anesthesia was also reviewed. The risks                            and benefits of the procedure and the sedation                            options and risks were discussed with the patient.                            All questions were answered, and informed consent                            was obtained. Prior Anticoagulants: The patient has                            taken no previous anticoagulant or antiplatelet                            agents. ASA Grade Assessment: I - A normal, healthy                            patient. After reviewing the risks and benefits,                            the patient was deemed in satisfactory condition to                            undergo the procedure.  After obtaining informed consent, the colonoscope                            was passed under direct vision. Throughout the                            procedure, the patient's blood pressure, pulse, and                            oxygen saturations were monitored continuously. The                            Olympus PFC-[id number] (#[id number]) Colonoscope was                            introduced through the anus and  advanced to the the                            cecum, identified by appendiceal orifice and                            ileocecal valve. The ileocecal valve, appendiceal                            orifice, and rectum were photographed. The quality                            of the bowel preparation was good. The colonoscopy                            was performed without difficulty. The patient                            tolerated the procedure well. Scope In: 11:50:00 AM Scope Out: 12:06:24 PM Scope Withdrawal Time: 0 hours 12 minutes 47 seconds  Total Procedure Duration: 0 hours 16 minutes 24 seconds  Findings:                 The perianal and digital rectal examinations were                            normal.                           A 7 mm polyp was found in the hepatic flexure. The                            polyp was sessile. The polyp was removed with a                            cold snare. Resection and retrieval were complete.                           The exam was otherwise without abnormality on  direct and retroflexion views. Complications:            No immediate complications. Estimated blood loss:                            None. Estimated Blood Loss:     Estimated blood loss: none. Impression:               - One 7 mm polyp at the hepatic flexure.                           - The exam was otherwise without abnormality on                            direct and retroflexion views.                           - No specimens collected. Recommendation:           - Repeat colonoscopy after studies are complete for                            surveillance based on pathology results.                           - Patient has a contact number available for                            emergencies. The signs and symptoms of potential                            delayed complications were discussed with the                            patient. Return to normal  activities tomorrow.                            Written discharge instructions were provided to the                            patient.                           - Resume previous diet.                           - Continue present medications.                           - Await pathology results. Ladene Artist, MD 03/30/2020 12:11:14 PM This report has been signed electronically.

## 2020-03-30 NOTE — Progress Notes (Signed)
Called to room to assist during endoscopic procedure.  Patient ID and intended procedure confirmed with present staff. Received instructions for my participation in the procedure from the performing physician.  

## 2020-03-30 NOTE — Patient Instructions (Signed)
YOU HAD AN ENDOSCOPIC PROCEDURE TODAY AT THE [facility]:   Refer to the procedure report that was given to you for any specific questions about what was found during the examination.  If the procedure report does not answer your questions, please call your gastroenterologist to clarify.  If you requested that your care partner not be given the details of your procedure findings, then the procedure report has been included in a sealed envelope for you to review at your convenience later.  YOU SHOULD EXPECT: Some feelings of bloating in the abdomen. Passage of more gas than usual.  Walking can help get rid of the air that was put into your GI tract during the procedure and reduce the bloating. If you had a lower endoscopy (such as a colonoscopy or flexible sigmoidoscopy) you may notice spotting of blood in your stool or on the toilet paper. If you underwent a bowel prep for your procedure, you may not have a normal bowel movement for a few days.  Please Note:  You might notice some irritation and congestion in your nose or some drainage.  This is from the oxygen used during your procedure.  There is no need for concern and it should clear up in a day or so.  SYMPTOMS TO REPORT IMMEDIATELY:   Following lower endoscopy (colonoscopy or flexible sigmoidoscopy):  Excessive amounts of blood in the stool  Significant tenderness or worsening of abdominal pains  Swelling of the abdomen that is new, acute  Fever of 100F or higher  For urgent or emergent issues, a gastroenterologist can be reached at any hour by calling [phone]. Do not use MyChart messaging for urgent concerns.    DIET:  We do recommend a small meal at first, but then you may proceed to your regular diet.  Drink plenty of fluids but you should avoid alcoholic beverages for 24 hours.  ACTIVITY:  You should plan to take it easy for the rest of today and you should NOT DRIVE or use heavy machinery until tomorrow (because  of the sedation medicines used during the test).    FOLLOW UP: Our staff will call the number listed on your records 48-72 hours following your procedure to check on you and address any questions or concerns that you may have regarding the information given to you following your procedure. If we do not reach you, we will leave a message.  We will attempt to reach you two times.  During this call, we will ask if you have developed any symptoms of COVID 19. If you develop any symptoms (ie: fever, flu-like symptoms, shortness of breath, cough etc.) before then, please call [phone].  If you test positive for Covid 19 in the 2 weeks post procedure, please call and report this information to us.    If any biopsies were taken you will be contacted by phone or by letter within the next 1-3 weeks.  Please call us at [phone] if you have not heard about the biopsies in 3 weeks.    SIGNATURES/CONFIDENTIALITY: You and/or your care partner have signed paperwork which will be entered into your electronic medical record.  These signatures attest to the fact that that the information above on your After Visit Summary has been reviewed and is understood.  Full responsibility of the confidentiality of this discharge information lies with you and/or your care-partner. 

## 2020-03-30 NOTE — Progress Notes (Signed)
Pt's states no medical or surgical changes since previsit or office visit. VS by CW. 

## 2020-04-01 ENCOUNTER — Telehealth: Payer: Self-pay

## 2020-04-01 NOTE — Telephone Encounter (Signed)
Called [phone] and left a message we tried to reach pt for a follow up call. maw

## 2020-04-13 ENCOUNTER — Other Ambulatory Visit: Payer: Self-pay | Admitting: Physician Assistant

## 2020-04-13 ENCOUNTER — Encounter: Payer: Self-pay | Admitting: Gastroenterology

## 2020-04-13 ENCOUNTER — Encounter: Payer: Self-pay | Admitting: Physician Assistant

## 2020-04-13 MED ORDER — LISDEXAMFETAMINE DIMESYLATE 20 MG PO CAPS: 20.0000 mg | ORAL_CAPSULE | Freq: Every day | ORAL | 0 refills | Status: DC

## 2020-04-18 ENCOUNTER — Encounter: Payer: Medicaid Other | Admitting: Physician Assistant

## 2020-04-26 DIAGNOSIS — Z3009 Encounter for other general counseling and advice on contraception: Secondary | ICD-10-CM | POA: Diagnosis not present

## 2020-05-03 ENCOUNTER — Encounter: Payer: Medicaid Other | Admitting: Physician Assistant

## 2020-05-09 ENCOUNTER — Encounter: Payer: Self-pay | Admitting: Physician Assistant

## 2020-05-17 DIAGNOSIS — T8332XA Displacement of intrauterine contraceptive device, initial encounter: Secondary | ICD-10-CM | POA: Diagnosis not present

## 2020-05-17 DIAGNOSIS — Z3202 Encounter for pregnancy test, result negative: Secondary | ICD-10-CM | POA: Diagnosis not present

## 2020-05-17 DIAGNOSIS — Z30430 Encounter for insertion of intrauterine contraceptive device: Secondary | ICD-10-CM | POA: Diagnosis not present

## 2020-05-18 ENCOUNTER — Encounter: Payer: Medicaid Other | Admitting: Physician Assistant

## 2020-05-27 ENCOUNTER — Encounter: Payer: Medicaid Other | Admitting: Physician Assistant

## 2020-06-07 DIAGNOSIS — H5213 Myopia, bilateral: Secondary | ICD-10-CM | POA: Diagnosis not present

## 2020-06-15 ENCOUNTER — Encounter: Payer: Medicaid Other | Admitting: Physician Assistant

## 2020-06-16 ENCOUNTER — Encounter: Payer: Self-pay | Admitting: Family

## 2020-06-16 ENCOUNTER — Other Ambulatory Visit: Payer: Self-pay

## 2020-06-16 ENCOUNTER — Ambulatory Visit (INDEPENDENT_AMBULATORY_CARE_PROVIDER_SITE_OTHER): Payer: Medicaid Other | Admitting: Family

## 2020-06-16 VITALS — BP 96/65 | HR 70 | Temp 98.4°F | Ht 66.0 in | Wt 111.8 lb

## 2020-06-16 DIAGNOSIS — R233 Spontaneous ecchymoses: Secondary | ICD-10-CM

## 2020-06-16 DIAGNOSIS — Z0000 Encounter for general adult medical examination without abnormal findings: Secondary | ICD-10-CM | POA: Diagnosis not present

## 2020-06-16 DIAGNOSIS — R238 Other skin changes: Secondary | ICD-10-CM | POA: Diagnosis not present

## 2020-06-16 LAB — LIPID PANEL
Cholesterol: 188 mg/dL (ref 0–200)
HDL: 80.5 mg/dL (ref >39.00)
LDL Cholesterol: 85 mg/dL (ref 0–99)
NonHDL: 107.28
Total CHOL/HDL Ratio: 2
Triglycerides: 111 mg/dL (ref 0.0–149.0)
VLDL: 22.2 mg/dL (ref 0.0–40.0)

## 2020-06-16 LAB — CBC WITH DIFFERENTIAL/PLATELET
Basophils Absolute: 0 10*3/uL (ref 0.0–0.1)
Basophils Relative: 0.5 % (ref 0.0–3.0)
Eosinophils Absolute: 0.2 10*3/uL (ref 0.0–0.7)
Eosinophils Relative: 2.6 % (ref 0.0–5.0)
HCT: 40.4 % (ref 36.0–46.0)
Hemoglobin: 13.8 g/dL (ref 12.0–15.0)
Lymphocytes Relative: 36.3 % (ref 12.0–46.0)
Lymphs Abs: 2.1 10*3/uL (ref 0.7–4.0)
MCHC: 34.2 g/dL (ref 30.0–36.0)
MCV: 90.6 fl (ref 78.0–100.0)
Monocytes Absolute: 0.5 10*3/uL (ref 0.1–1.0)
Monocytes Relative: 8.5 % (ref 3.0–12.0)
Neutro Abs: 3 10*3/uL (ref 1.4–7.7)
Neutrophils Relative %: 52.1 % (ref 43.0–77.0)
Platelets: 257 10*3/uL (ref 150.0–400.0)
RBC: 4.46 Mil/uL (ref 3.87–5.11)
RDW: 13.1 % (ref 11.5–15.5)
WBC: 5.8 10*3/uL (ref 4.0–10.5)

## 2020-06-16 LAB — COMPREHENSIVE METABOLIC PANEL
ALT: 17 U/L (ref 0–35)
AST: 24 U/L (ref 0–37)
Albumin: 4.4 g/dL (ref 3.5–5.2)
Alkaline Phosphatase: 55 U/L (ref 39–117)
BUN: 15 mg/dL (ref 6–23)
CO2: 28 mEq/L (ref 19–32)
Calcium: 9.7 mg/dL (ref 8.4–10.5)
Chloride: 103 mEq/L (ref 96–112)
Creatinine, Ser: 0.65 mg/dL (ref 0.40–1.20)
GFR: 120.88 mL/min (ref >60.00)
Glucose, Bld: 61 mg/dL — ABNORMAL LOW (ref 70–99)
Potassium: 3.7 mEq/L (ref 3.5–5.1)
Sodium: 138 mEq/L (ref 135–145)
Total Bilirubin: 1.6 mg/dL — ABNORMAL HIGH (ref 0.2–1.2)
Total Protein: 7.2 g/dL (ref 6.0–8.3)

## 2020-06-16 LAB — TSH: TSH: 2.17 u[IU]/mL (ref 0.35–4.50)

## 2020-06-16 NOTE — Progress Notes (Signed)
Established Patient Office Visit  Subjective:  Patient ID: Mary Cochran, female    DOB: 01-29-1996  Age: 25 y.o. MRN: [id number]  CC:  Chief Complaint  Patient presents with  . Annual Exam    Not fasting  . Leg Bruising    HPI Mary Cochran 25 year old female, married with 2 children ages 26 and 3 is here for complete physical exam has concerns of bruising that appears to occur often.  Reports while she was pregnant with her daughter she did did not notice the bruising.  However, with her son she Mary Cochran and continues to have bruising pretty easily.  She does not routinely exercise or follow any particular diet immunizations are up-to-date.  She sees gynecology for her women's health.  She has a history of attention deficit disorder and is on Vyvanse.  Tolerates the medication well.  However, it does suppress her appetite.  Reports that she eats about twice a day.  She occasionally takes breaks from the medication when she does not need it.  However, overall it is very beneficial with helping her organize her thoughts  Past Medical History:  Diagnosis Date  . ALLERGIC RHINITIS 12/12/2006  . Allergy    SEASONAL  . Anxiety   . Depression 10/03/2011  . GILBERT'S SYNDROME 03/07/2010  . Headache    Migraines  . IBS (irritable bowel syndrome) 10/04/2015  . INSOMNIA-SLEEP DISORDER-UNSPEC 04/18/2007  . Tubular adenoma of colon 10/2014    Past Surgical History:  Procedure Laterality Date  . COLONOSCOPY  2016  . MOUTH SURGERY    . TONSILLECTOMY      Family History  Problem Relation Age of Onset  . Breast cancer Maternal Grandmother   . Heart disease Maternal Grandmother   . Miscarriages / Stillbirths Maternal Grandmother   . Pancreatic cancer Other   . Kidney cancer Maternal Aunt   . Heart disease Father   . Heart disease Maternal Grandfather   . Varicose Veins Maternal Grandfather   . Varicose Veins Mother   . Colon polyps Mother   . Colon cancer Neg Hx   . Esophageal  cancer Neg Hx   . Rectal cancer Neg Hx   . Stomach cancer Neg Hx     Social History   Socioeconomic History  . Marital status: Married    Spouse name: Not on file  . Number of children: Not on file  . Years of education: Not on file  . Highest education level: Not on file  Occupational History  . Occupation: Social worker  Tobacco Use  . Smoking status: Never Smoker  . Smokeless tobacco: Never Used  Vaping Use  . Vaping Use: Never used  Substance and Sexual Activity  . Alcohol use: Yes    Alcohol/week: 0.0 standard drinks    Comment: OCC.  . Drug use: No  . Sexual activity: Yes    Birth control/protection: None  Other Topics Concern  . 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 acetaminophen (TYLENOL) 325 MG tablet Take 2 tablets (650  mg total) by mouth every 6 (six) hours as needed (for pain scale < 4). 60 tablet 0  . lisdexamfetamine (VYVANSE) 20 MG capsule Take 1 capsule (20 mg total) by mouth daily before breakfast. 30 capsule 0  . Multiple Vitamin (MULTIVITAMIN PO) Take by mouth.    . Probiotic Product (PROBIOTIC PO) Take by mouth.    . sertraline (ZOLOFT) 50 MG tablet Take 1 tablet (50 mg total) by mouth at bedtime. 90 tablet 0  . Ubrogepant (UBRELVY) 100 MG TABS Take 1 tablet by mouth as needed (May repeat in 2 hours.  Maximum 2 tablets in 24 hours). 10 tablet 11  . lisdexamfetamine (VYVANSE) 20 MG capsule Take 1 capsule (20 mg total) by mouth daily before breakfast. 30 capsule 0  . lisdexamfetamine (VYVANSE) 20 MG capsule Take 1 capsule (20 mg total)  by mouth daily before breakfast. 30 capsule 0  . LORazepam (ATIVAN) 0.5 MG tablet Take 1 tablet (0.5 mg total) by mouth 2 (two) times daily as needed for anxiety. (Patient not taking: No sig reported) 30 tablet 0   No facility-administered medications prior to visit.    Allergies  Allergen Reactions  . Amoxicillin Hives  . Azithromycin Other (See Comments)    Severe stomach ache  . Hydrocodone Other (See Comments)    GI upset  . Morphine     Unknown, childhood reaction  . Ciprofloxacin Hcl Hives    ROS Review of Systems  Constitutional: Negative.   HENT: Negative.   Eyes: Negative.   Respiratory: Negative.   Cardiovascular: Negative.   Gastrointestinal: Negative.   Endocrine: Negative.   Genitourinary: Negative.   Musculoskeletal: Negative.   Skin: Negative.   Allergic/Immunologic: Negative.   Neurological: Negative.   Hematological: Bruises/bleeds easily.  Psychiatric/Behavioral: Negative.        Attention deficit-stable  All other systems reviewed and are negative.     Objective:    Physical Exam Vitals reviewed.  Constitutional:      Appearance: Normal appearance.     Comments: Thin in body frame  HENT:     Head: Normocephalic and atraumatic.     Right Ear: Tympanic membrane normal.     Left Ear: Tympanic membrane normal.     Nose: Nose normal.     Mouth/Throat:     Mouth: Mucous membranes are dry.  Eyes:     Extraocular Movements: Extraocular movements intact.     Pupils: Pupils are equal, round, and reactive to light.  Cardiovascular:     Rate and Rhythm: Normal rate and regular rhythm.     Pulses: Normal pulses.     Heart sounds: Normal heart sounds.  Pulmonary:     Effort: Pulmonary effort is normal.     Breath sounds: Normal breath sounds.  Abdominal:     General: Abdomen is flat. Bowel sounds are normal.     Palpations: Abdomen is soft.     Tenderness: There is no abdominal tenderness. There is no guarding or rebound.     Hernia: No hernia  is present.  Musculoskeletal:        General: Normal range of motion.     Cervical back: Normal range of motion and neck supple.  Skin:    General: Skin is warm and dry.  Neurological:     General: No focal deficit present.     Mental Status: She is alert and oriented to person, place, and time.  Psychiatric:        Mood and Affect:  Mood normal.        Behavior: Behavior normal.     BP 96/65   Pulse 70   Temp 98.4 F (36.9 C)   Ht 5\' 6"  (1.676 m)   Wt 111 lb 12.8 oz (50.7 kg)   LMP 06/14/2020   SpO2 99%   BMI 18.04 kg/m  Wt Readings from Last 3 Encounters:  06/16/20 111 lb 12.8 oz (50.7 kg)  03/30/20 110 lb (49.9 kg)  03/16/20 110 lb (49.9 kg)     Health Maintenance Due  Topic Date Due  . Hepatitis C Screening  Never done  . PAP-Cervical Cytology Screening  Never done  . PAP SMEAR-Modifier  08/22/2018  . COVID-19 Vaccine (3 - Booster for Pfizer series) 12/24/2019    There are no preventive care reminders to display for this patient.  Lab Results  Component Value Date   TSH 1.37 10/04/2015   Lab Results  Component Value Date   WBC 8.2 01/23/2018   HGB 13.3 01/23/2018   HCT 39.1 01/23/2018   MCV 90.1 01/23/2018   PLT 269 01/23/2018   Lab Results  Component Value Date   NA 138 10/04/2015   K 3.7 10/04/2015   CO2 30 10/04/2015   GLUCOSE 71 10/04/2015   BUN 11 10/04/2015   CREATININE 0.74 10/04/2015   BILITOT 1.8 (H) 10/04/2015   ALKPHOS 54 10/04/2015   AST 18 10/04/2015   ALT 13 10/04/2015   PROT 7.3 10/04/2015   ALBUMIN 4.2 10/04/2015   CALCIUM 9.3 10/04/2015   GFR 103.87 10/04/2015   Lab Results  Component Value Date   CHOL 175 10/04/2015   Lab Results  Component Value Date   HDL 64.50 10/04/2015   Lab Results  Component Value Date   LDLCALC 96 10/04/2015   Lab Results  Component Value Date   TRIG 72.0 10/04/2015   Lab Results  Component Value Date   CHOLHDL 3 10/04/2015   No results found for: HGBA1C    Assessment & Plan:    Problem List Items Addressed This Visit   None   Visit Diagnoses    Easy bruising    -  Primary   Relevant Orders   Comprehensive metabolic panel   CBC with Differential   Lipid panel   TSH   HIV antibody   Hepatitis C antibody screen   Routine general medical examination at a health care facility       Relevant Orders   Comprehensive metabolic panel   CBC with Differential   Lipid panel   TSH   HIV antibody   Hepatitis C antibody screen      No orders of the defined types were placed in this encounter. Call the office with any questions or concerns.  Recheck pending labs and sooner as needed.  Follow-up: Return in 1 year (on 06/16/2021).    Kennyth Arnold, FNP

## 2020-06-16 NOTE — Patient Instructions (Signed)
Health Maintenance, Female Adopting a healthy lifestyle and getting preventive care are important in promoting health and wellness. Ask your health care provider about:  The right schedule for you to have regular tests and exams.  Things you can do on your own to prevent diseases and keep yourself healthy. What should I know about diet, weight, and exercise? Eat a healthy diet  Eat a diet that includes plenty of vegetables, fruits, low-fat dairy products, and lean protein.  Do not eat a lot of foods that are high in solid fats, added sugars, or sodium.   Maintain a healthy weight Body mass index (BMI) is used to identify weight problems. It estimates body fat based on height and weight. Your health care provider can help determine your BMI and help you achieve or maintain a healthy weight. Get regular exercise Get regular exercise. This is one of the most important things you can do for your health. Most adults should:  Exercise for at least 150 minutes each week. The exercise should increase your heart rate and make you sweat (moderate-intensity exercise).  Do strengthening exercises at least twice a week. This is in addition to the moderate-intensity exercise.  Spend less time sitting. Even light physical activity can be beneficial. Watch cholesterol and blood lipids Have your blood tested for lipids and cholesterol at 27 years of age, then have this test every 5 years. Have your cholesterol levels checked more often if:  Your lipid or cholesterol levels are high.  You are older than 27 years of age.  You are at high risk for heart disease. What should I know about cancer screening? Depending on your health history and family history, you may need to have cancer screening at various ages. This may include screening for:  Breast cancer.  Cervical cancer.  Colorectal cancer.  Skin cancer.  Lung cancer. What should I know about heart disease, diabetes, and high blood  pressure? Blood pressure and heart disease  High blood pressure causes heart disease and increases the risk of stroke. This is more likely to develop in people who have high blood pressure readings, are of African descent, or are overweight.  Have your blood pressure checked: ? Every 3-5 years if you are 18-39 years of age. ? Every year if you are 40 years old or older. Diabetes Have regular diabetes screenings. This checks your fasting blood sugar level. Have the screening done:  Once every three years after age 40 if you are at a normal weight and have a low risk for diabetes.  More often and at a younger age if you are overweight or have a high risk for diabetes. What should I know about preventing infection? Hepatitis B If you have a higher risk for hepatitis B, you should be screened for this virus. Talk with your health care provider to find out if you are at risk for hepatitis B infection. Hepatitis C Testing is recommended for:  Everyone born from 1945 through 1965.  Anyone with known risk factors for hepatitis C. Sexually transmitted infections (STIs)  Get screened for STIs, including gonorrhea and chlamydia, if: ? You are sexually active and are younger than 27 years of age. ? You are older than 27 years of age and your health care provider tells you that you are at risk for this type of infection. ? Your sexual activity has changed since you were last screened, and you are at increased risk for chlamydia or gonorrhea. Ask your health care provider   if you are at risk.  Ask your health care provider about whether you are at high risk for HIV. Your health care provider may recommend a prescription medicine to help prevent HIV infection. If you choose to take medicine to prevent HIV, you should first get tested for HIV. You should then be tested every 3 months for as long as you are taking the medicine. Pregnancy  If you are about to stop having your period (premenopausal) and  you may become pregnant, seek counseling before you get pregnant.  Take 400 to 800 micrograms (mcg) of folic acid every day if you become pregnant.  Ask for birth control (contraception) if you want to prevent pregnancy. Osteoporosis and menopause Osteoporosis is a disease in which the bones lose minerals and strength with aging. This can result in bone fractures. If you are 65 years old or older, or if you are at risk for osteoporosis and fractures, ask your health care provider if you should:  Be screened for bone loss.  Take a calcium or vitamin D supplement to lower your risk of fractures.  Be given hormone replacement therapy (HRT) to treat symptoms of menopause. Follow these instructions at home: Lifestyle  Do not use any products that contain nicotine or tobacco, such as cigarettes, e-cigarettes, and chewing tobacco. If you need help quitting, ask your health care provider.  Do not use street drugs.  Do not share needles.  Ask your health care provider for help if you need support or information about quitting drugs. Alcohol use  Do not drink alcohol if: ? Your health care provider tells you not to drink. ? You are pregnant, may be pregnant, or are planning to become pregnant.  If you drink alcohol: ? Limit how much you use to 0-1 drink a day. ? Limit intake if you are breastfeeding.  Be aware of how much alcohol is in your drink. In the [place] one drink equals one 12 oz bottle of beer (355 mL), one 5 oz glass of wine (148 mL), or one 1 oz glass of hard liquor (44 mL). General instructions  Schedule regular health, dental, and eye exams.  Stay current with your vaccines.  Tell your health care provider if: ? You often feel depressed. ? You have ever been abused or do not feel safe at home. Summary  Adopting a healthy lifestyle and getting preventive care are important in promoting health and wellness.  Follow your health care provider's instructions about healthy  diet, exercising, and getting tested or screened for diseases.  Follow your health care provider's instructions on monitoring your cholesterol and blood pressure. This information is not intended to replace advice given to you by your health care provider. Make sure you discuss any questions you have with your health care provider. Document Revised: 01/15/2018 Document Reviewed: 01/15/2018 Elsevier Patient Education  2021 Elsevier Inc.  

## 2020-06-17 ENCOUNTER — Telehealth: Payer: Self-pay

## 2020-06-17 LAB — HEPATITIS C ANTIBODY
Hepatitis C Ab: NONREACTIVE
SIGNAL TO CUT-OFF: 0.01 (ref <1.00)

## 2020-06-17 LAB — HIV ANTIBODY (ROUTINE TESTING W REFLEX): HIV 1&2 Ab, 4th Generation: NONREACTIVE

## 2020-06-17 NOTE — Telephone Encounter (Signed)
Patient is wanting to make sure her glucose levels and bilirubin ranges are good as well saw they were marked "abnormal" on mychart. Let her know labs were normal, but she wants clarification wants to make sure she is not "hypoglycemic".

## 2020-06-20 NOTE — Telephone Encounter (Signed)
Pt is scheduled with Dr. Jerline Pain on Friday

## 2020-06-20 NOTE — Telephone Encounter (Signed)
Patient would like to schedule an appointment with Pad[facility] to further discuss her low glucose levels.

## 2020-06-21 DIAGNOSIS — Z30431 Encounter for routine checking of intrauterine contraceptive device: Secondary | ICD-10-CM | POA: Diagnosis not present

## 2020-06-24 ENCOUNTER — Encounter: Payer: Self-pay | Admitting: Family Medicine

## 2020-06-24 ENCOUNTER — Other Ambulatory Visit: Payer: Self-pay

## 2020-06-24 ENCOUNTER — Ambulatory Visit (INDEPENDENT_AMBULATORY_CARE_PROVIDER_SITE_OTHER): Payer: Medicaid Other | Admitting: Family Medicine

## 2020-06-24 VITALS — BP 100/63 | HR 95 | Temp 98.8°F | Ht 66.0 in | Wt 110.8 lb

## 2020-06-24 DIAGNOSIS — R21 Rash and other nonspecific skin eruption: Secondary | ICD-10-CM

## 2020-06-24 DIAGNOSIS — E162 Hypoglycemia, unspecified: Secondary | ICD-10-CM

## 2020-06-24 LAB — BASIC METABOLIC PANEL
BUN: 16 mg/dL (ref 6–23)
CO2: 27 mEq/L (ref 19–32)
Calcium: 9.4 mg/dL (ref 8.4–10.5)
Chloride: 105 mEq/L (ref 96–112)
Creatinine, Ser: 0.71 mg/dL (ref 0.40–1.20)
GFR: 116.72 mL/min (ref >60.00)
Glucose, Bld: 91 mg/dL (ref 70–99)
Potassium: 4 mEq/L (ref 3.5–5.1)
Sodium: 139 mEq/L (ref 135–145)

## 2020-06-24 NOTE — Patient Instructions (Signed)
It was very nice to see you today!  We will check a few additional labs today to make sure there is no other possible causes of your low blood sugars.  Please check your blood sugar if you have another episode.  We will contact you next week once we get results back on your labs.  Take care, Dr Jerline Pain  PLEASE NOTE:  If you had any lab tests please let us know if you have not heard back within a few days. You may see your results on mychart before we have a chance to review them but we will give you a call once they are reviewed by [place]. If we ordered any referrals today, please let us know if you have not heard from their office within the next week.   Please try these tips to maintain a healthy lifestyle:   Eat at least 3 REAL meals and 1-2 snacks per day.  Aim for no more than 5 hours between eating.  If you eat breakfast, please do so within one hour of getting up.    Each meal should contain half fruits/vegetables, one quarter protein, and one quarter carbs (no bigger than a computer mouse)   Cut down on sweet beverages. This includes juice, soda, and sweet tea.     Drink at least 1 glass of water with each meal and aim for at least 8 glasses per day   Exercise at least 150 minutes every week.

## 2020-06-24 NOTE — Progress Notes (Addendum)
   Mary Cochran is a 25 y.o. female who presents today for an office visit.  Assessment/Plan:  Rash Likely contact dermatitis.  Recommended cortisone cream as needed.  She will let me know if not improving.  Hypoglycemia Likely benign etiology though given her symptomatic episodes that have been persistent for multiple years we will check further labs with C-peptide, insulin, proinsulin levels.   Also repeat BMET. Also advised her to check her blood sugar if she has recurrence of symptoms.  Glucometer was given today.  Discussed importance of regular eating.  Gilberts Syndrome Reassured patient that recent bilirubin was within expected range.    Subjective:  HPI:  Patient here to talk about low blood sugar.  She had labs done about a week ago showed glucose of 61.  She has had periodic episodes of diaphoresis, flushing, and syncope for the last 10+ years.  She was hospitalized for this about 10 years ago but there was no obvious cause for this.  She has these episodes about once or twice per year.  Sometimes she will feel like her blood sugar is low if she goes more than a few hours without eating.       Objective:  Physical Exam: BP 100/63   Pulse 95   Temp 98.8 F (37.1 C) (Temporal)   Ht 5\' 6"  (1.676 m)   Wt 110 lb 12.8 oz (50.3 kg)   LMP 06/14/2020   SpO2 98%   BMI 17.88 kg/m   Gen: No acute distress, resting comfortably CV: Regular rate and rhythm with no murmurs appreciated Pulm: Normal work of breathing, clear to auscultation bilaterally with no crackles, wheezes, or rhonchi Skin: Faintly erythematous lesion on right upper extremity. Neuro: Grossly normal, moves all extremities Psych: Normal affect and thought content      Emy Angevine M. Jerline Pain, MD 06/24/2020 11:45 AM

## 2020-06-28 LAB — C-PEPTIDE: C-Peptide: 1.48 ng/mL (ref 0.80–3.85)

## 2020-06-28 LAB — INSULIN, RANDOM: Insulin: 4.5 u[IU]/mL

## 2020-06-28 LAB — PROINSULIN

## 2020-06-28 NOTE — Progress Notes (Signed)
Please inform patient of the following:  Her labs are all normal.  Would like for her to check her blood sugar if she has repeat episode let us know.  Do not need to do any further testing at this point.

## 2020-07-19 ENCOUNTER — Other Ambulatory Visit: Payer: Self-pay | Admitting: Physician Assistant

## 2020-07-19 NOTE — Telephone Encounter (Signed)
Pt requesting refill for Vyvanse 20 mg. Last OV 06/2020.

## 2020-07-22 DIAGNOSIS — R059 Cough, unspecified: Secondary | ICD-10-CM | POA: Diagnosis not present

## 2020-07-22 DIAGNOSIS — Z20822 Contact with and (suspected) exposure to covid-19: Secondary | ICD-10-CM | POA: Diagnosis not present

## 2020-07-22 DIAGNOSIS — J00 Acute nasopharyngitis [common cold]: Secondary | ICD-10-CM | POA: Diagnosis not present

## 2020-07-25 ENCOUNTER — Encounter: Payer: Self-pay | Admitting: Family Medicine

## 2020-07-27 ENCOUNTER — Telehealth: Payer: Medicaid Other | Admitting: Physician Assistant

## 2020-07-27 DIAGNOSIS — L508 Other urticaria: Secondary | ICD-10-CM

## 2020-07-27 MED ORDER — HYDROXYZINE PAMOATE 25 MG PO CAPS: 25.0000 mg | ORAL_CAPSULE | Freq: Three times a day (TID) | ORAL | 0 refills | Status: DC | PRN

## 2020-07-27 NOTE — Progress Notes (Signed)
I have spent 5 minutes in review of e-visit questionnaire, review and updating patient chart, medical decision making and response to patient.   Myrel Rappleye Cody Laiken Sandy, PA-C    

## 2020-07-27 NOTE — Progress Notes (Signed)
E Visit for Rash  We are sorry that you are not feeling well. Here is how we plan to help!   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 I am starting you on a prescription antihistamine, Hydroxyzine, to help with itch and the hives themselves. You can take an H2 blocker like Famotidine (Pepcid) OTC once daily as well to get further control over current symptoms. Avoid NSAID (Ibuprofen, Aleve) use as this can also precipitate hives. I want you to schedule a follow-up with your PCP office within next 4-5 days to ensure this is resolving, or to get further workup if not going completely away. Sooner if you note any new or worsening symptoms.   HOME CARE:  Take cool showers and avoid direct sunlight. Apply cool compress or wet dressings. Take a bath in an oatmeal bath.  Sprinkle content of one Aveeno packet under running faucet with comfortably warm water.  Bathe for 15-20 minutes, 1-2 times daily.  Pat dry with a towel. Do not rub the rash. Use hydrocortisone cream. Take an antihistamine like Benadryl for widespread rashes that itch.  The adult dose of Benadryl is 25-50 mg by mouth 4 times daily. Caution:  This type of medication may cause sleepiness.  Do not drink alcohol, drive, or operate dangerous machinery while taking antihistamines.  Do not take these medications if you have prostate enlargement.  Read package instructions thoroughly on all medications that you take.  GET HELP RIGHT AWAY IF:  Symptoms don't go away after treatment. Severe itching that persists. If you rash spreads or swells. If you rash begins to smell. If it blisters and opens or develops a yellow-brown crust. You develop a fever. You have a sore throat. You become short of breath.  MAKE SURE YOU:  Understand these instructions. Will watch your condition. Will get help right away if you are not doing well or get worse.  Thank you for choosing an e-visit. Your e-visit answers were  reviewed by a board certified advanced clinical practitioner to complete your personal care plan. Depending upon the condition, your plan could have included both over the counter or prescription medications. Please review your pharmacy choice. Be sure that the pharmacy you have chosen is open so that you can pick up your prescription now.  If there is a problem you may message your provider in [place] to have the prescription routed to another pharmacy. Your safety is important to [place]. If you have drug allergies check your prescription carefully.  For the next 24 hours, you can use MyChart to ask questions about today's visit, request a non-urgent call back, or ask for a work or school excuse from your e-visit provider. You will get an email in the next two days asking about your experience. I hope that your e-visit has been valuable and will speed your recovery.

## 2020-08-29 ENCOUNTER — Other Ambulatory Visit: Payer: Self-pay | Admitting: Family Medicine

## 2020-09-07 ENCOUNTER — Ambulatory Visit (INDEPENDENT_AMBULATORY_CARE_PROVIDER_SITE_OTHER): Payer: Medicaid Other

## 2020-09-07 ENCOUNTER — Ambulatory Visit: Payer: Self-pay

## 2020-09-07 ENCOUNTER — Other Ambulatory Visit: Payer: Self-pay

## 2020-09-07 ENCOUNTER — Ambulatory Visit (INDEPENDENT_AMBULATORY_CARE_PROVIDER_SITE_OTHER): Payer: Medicaid Other | Admitting: Family Medicine

## 2020-09-07 VITALS — BP 110/80 | HR 85 | Ht 66.0 in | Wt 110.6 lb

## 2020-09-07 DIAGNOSIS — M79631 Pain in right forearm: Secondary | ICD-10-CM | POA: Diagnosis not present

## 2020-09-07 NOTE — Patient Instructions (Signed)
Thank you for coming in today.   Please use Voltaren gel (Generic Diclofenac Gel) up to 4x daily for pain as needed.  This is available over-the-counter as both the name brand Voltaren gel and the generic diclofenac gel.   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   Flexor Carpi Ulnaris and [employer]onic Radialis Tendinitis Rehab Ask your health care provider which exercises are safe for you. Do exercises exactly as told by your health care provider and adjust them as directed. It is normal to feel mild stretching, pulling, tightness, or discomfort as you do these exercises. Stop right away if you feel sudden pain or your pain gets worse. Do notbegin these exercises until told by your health care provider. Range-of-motion exercises These exercises warm up your muscles and joints and improve the movement and flexibility of your forearm. These exercises also help to relieve pain,numbness, and tingling. They are done using the muscles in your injured forearm. Wrist flexion Sit or stand with your left / right elbow bent to a 90-degree angle (right angle) at your side and your palm facing the floor. Slowly bend your wrist so that your fingers move toward the floor (flexion), stopping when you feel a gentle stretch over the back of your forearm. Hold this position for __________ seconds. Slowly return to the starting position. Repeat __________ times. Complete this exercise __________ times a day. Wrist extension Sit or stand with your left / right elbow bent to a 90-degree angle (right angle) at your side and your palm facing the floor. Slowly lift your wrist up so that your fingers move toward the ceiling (extension), stopping when you feel a gentle stretch on the palm side of your forearm. Hold this position for __________ seconds. Slowly return to the starting position. Repeat __________ times. Complete this exercise __________ times a day. Forearm rotation, supination Sit or  stand with your left / right elbow bent to a 90-degree angle (right angle) at your side. Position your forearm so that the thumb is facing the ceiling (neutral position). Turn (rotate) your palm up toward the ceiling (supination) until you feel a gentle stretch on the inside of your forearm. Hold this position for __________ seconds. Slowly return your palm to the starting position. Repeat __________ times. Complete this exercise __________ times a day. Forearm rotation, pronation Sit or stand with your left / right elbow bent to a 90-degree angle (right angle) at your side. Position your forearm so that the thumb is facing the ceiling (neutral position). Turn (rotate) your palm down toward the floor (pronation) until you feel a gentle stretch on the top of your forearm. Hold this position for __________ seconds. Slowly return your palm to the starting position. Repeat __________ times. Complete this exercise __________ times a day. Stretching exercises These exercises warm up your muscles and joints and improve the movement and flexibility of your forearm. These exercises also help to relieve pain, numbness, and tingling. They are done using your healthy forearm to helpstretch the muscles in your injured forearm. Wrist flexion, assisted  Sit or stand and extend your left / right arm in front of you. Turn your palm down toward the floor and relax your wrist. With your other hand (assisted), gently push on the back of your hand to bend your wrist and fingers toward the floor (flexion). Stop when you feel a gentle stretch on the top of your forearm. Hold this position for __________ seconds. Slowly return to the starting position.  Repeat __________ times. Complete this exercise __________ times a day. Wrist extension, assisted  Sit or stand and extend your left / right arm in front of you. Turn your palm up toward the ceiling and relax your wrist. With your other hand (assisted), gently pull  your palm and fingertips back so your fingers point down toward the floor (extension). You should feel a gentle stretch on the palm-side of your forearm. Hold this position for __________ seconds. Slowly return to the starting position. Repeat __________ times. Complete this exercise __________ times a day. Forearm rotation, supination Sit with your left / right elbow bent to a 90-degree angle (right angle) with your forearm resting on a table. Keeping your upper body and shoulder still, use your other hand to turn (rotate) your forearm palm-up (supination) until you feel a gentle to moderate stretch. Hold this position for __________ seconds. Slowly release the stretch, and return to the starting position. Repeat __________ times. Complete this exercise __________ times a day. Forearm rotation, pronation Sit with your left / right elbow bent to a 90-degree angle (right angle) with your forearm resting on a table. Keeping your upper body and shoulder still, use your other hand to turn (rotate) your forearm palm down (pronation) until you feel a gentle to moderate stretch. Hold this position for __________ seconds. Slowly release the stretch, and return to the starting position. Repeat __________ times. Complete this exercise __________ times a day. Strengthening exercises These exercises build strength and endurance in your wrist and forearm. Endurance is the ability to use your muscles for a long time, even after theyget tired. Wrist flexion  Sit with your left / right forearm supported on a table and your hand resting palm-up over the edge of the table. Your elbow should be bent to a 90-degree angle (right angle) and rest below the level of your shoulder. Hold a __________ weight in your hand. Or, hold a rubber exercise band or tube in both hands, keeping your hands at the same level and hip distance apart. There should be a slight tension in the exercise band or tube. Slowly lift your hand  up toward the ceiling (flexion). Hold this position for __________ seconds. Slowly lower your hand back to the starting position. Repeat __________ times. Complete this exercise __________ times a day. Wrist extension  Sit with your left / right forearm supported on a table and your hand resting palm-down over the edge of the table. Your elbow should be bent to a 90-degree angle (right angle) and rest below the level of your shoulder. Hold a __________ weight in your hand. Or, hold a rubber exercise band or tube in both hands, keeping your hands at the same level and hip distance apart. There should be a slight tension in the exercise band or tube. Slowly lift your hand up toward the ceiling (extension). Hold this position for __________ seconds. Slowly lower your hand back to the starting position. Repeat __________ times. Complete this exercise __________ times a day. Ulnar deviation Stand with a __________ weight in your left / right hand. Or, sit with your healthy hand supported, and hold on to a rubber exercise band or tube with both hands. There should be a slight tension in the exercise band or tube. Move your wrist so your pinkie travels toward your forearm and your thumb moves away from your forearm (ulnar deviation). Hold this position for __________ seconds. Slowly return to the starting position. Repeat __________ times. Complete this exercise __________  times a day. Radial deviation Stand with a __________ weight in your left / right hand. Or, sit and hold on to a rubber exercise band or tube with both hands while your left / right arm is supported on a table and the other arm is below the table. There should be a slight tension in the exercise band or tube. If you are holding a weight, raise your left / right hand so your thumb moves toward your forearm at a comfortable height. You will not need to raise your hand very far. If you are holding an exercise band or tube, pull on it to  raise your thumb toward the ceiling. You will not need to raise your hand very far. Hold this position for __________ seconds. Slowly lower your wrist to the starting position. Repeat __________ times. Complete this exercise __________ times a day. Forearm rotation, supination  Sit with your left / right forearm supported on a table. Keep your hand resting palm-down and your wrist over the edge of the table. Your elbow should be at your side and bent to a 90-degree angle (right angle). Keep your wrist stable. Do not allow it to bend backward or forward during the exercise. Gently hold a lightweight hammer with your left / right hand. Without moving your elbow or wrist, slowly turn (rotate) your forearm so your palm faces up toward the ceiling (supination). Hold this position for __________ seconds. Slowly return your forearm to the starting position. Repeat __________ times. Complete this exercise __________ times a day. Forearm rotation, pronation  Sit with your left / right forearm supported on a table. Keep your hand resting palm-up and your wrist over the edge of the table. Your elbow should be at your side and bent to a 90-degree angle (right angle). Keep your wrist stable. Do not allow it to bend backward or forward during the exercise. Gently hold a lightweight hammer with your left / right hand. Without moving your elbow or wrist, slowly turn (rotate) your palm down toward the floor (pronation). Hold this position for __________ seconds. Slowly return your forearm to the starting position. Repeat __________ times. Complete this exercise __________ times a day. Grip  Hold one of these items in your left / right hand: modeling clay, therapy putty, a dense sponge, a stress ball, or a large, rolled sock. Squeeze as hard as you can without increasing any pain. Hold this position for __________ seconds. Slowly release your grip. Repeat __________ times. Complete this exercise __________  times a day. This information is not intended to replace advice given to you by your health care provider. Make sure you discuss any questions you have with your healthcare provider. Document Revised: 05/20/2018 Document Reviewed: 03/27/2018 Elsevier Patient Education  [place].

## 2020-09-07 NOTE — Progress Notes (Signed)
   I, Peterson Lombard, LAT, ATC acting as a scribe for Lynne Leader, MD.  Subjective:    CC: Right arm pain  HPI: Pt is a 25 y/o female c/o R arm pain x 2 days. MOI: Pt was assembling a trundle bed and it came apart and catch it, but it fell on her R forearm. Pt locates pain to ulnar and anterior aspect of the R forearm.  She has a 89 and 30-year-old at home.  UE Numbness/tingling: yes- stabbing Aggravates: wrist ext, putting pressure on Treatments tried: none   Pertinent review of Systems: No fevers or chills  Relevant historical information: IBS   Objective:    Vitals:   09/07/20 1301  BP: 110/80  Pulse: 85  SpO2: 97%   General: Well Developed, well nourished, and in no acute distress.   MSK: Left forearm slightly swollen distal ulna.  Tender palpation in this region.  Pain with resisted wrist flexion and ulnar deviation. Pulses cap refill and sensation are intact distally.  Lab and Radiology Results  Diagnostic Limited MSK Ultrasound of: Left ulnar forearm Normal bony structures. No injury to the tendons ulnar forearm.  Normal nervous and arterial structures. Impression: Normal-appearing ulnar forearm ultrasound  X-ray images left forearm obtained today personally and independently interpreted No fractures visible. Await formal radiology review  Impression and Recommendations:    Assessment and Plan: 25 y.o. female with left ulnar contusion due to a hard object colliding with the distal ulna area.  This injury happened only a few days ago and [place]na is pretty sore.  I think she is basically developed a contusion and some soreness of the flexor carpi ulnaris tendon.  Plan for some home exercise program focused on this area as well as some topical Voltaren gel and oral NSAIDs.  Recommend using a wrist brace at home.  If not improved she will let me know and I will refer to hand PT.  Recheck as needed.Marland Kitchen  PDMP not reviewed this encounter. Orders Placed This Encounter   Procedures   DG Forearm Right    Standing Status:   Future    Number of Occurrences:   1    Standing Expiration Date:   09/07/2021    Order Specific Question:   Reason for Exam (SYMPTOM  OR DIAGNOSIS REQUIRED)    Answer:   right forearm pain    Order Specific Question:   Is patient pregnant?    Answer:   No    Order Specific Question:   Preferred imaging location?    Answer:   [facility]   [place] LIMITED JOINT SPACE STRUCTURES UP RIGHT(NO LINKED CHARGES)    Order Specific Question:   Reason for Exam (SYMPTOM  OR DIAGNOSIS REQUIRED)    Answer:   eval arm pain    Order Specific Question:   Preferred imaging location?    Answer:   [facility]   No orders of the defined types were placed in this encounter.   Discussed warning signs or symptoms. Please see discharge instructions. Patient expresses understanding.   The above documentation has been reviewed and is accurate and complete Lynne Leader, M.D.

## 2020-09-09 NOTE — Progress Notes (Signed)
Right forearm x-ray looks normal to radiology.

## 2020-09-27 NOTE — Progress Notes (Deleted)
NEUROLOGY FOLLOW UP OFFICE NOTE  SHARQUITA KUCK [id number]  Assessment/Plan:   Migraine with aura, without status migrainosus, not intractable Tension-type headache with cervicalgia  Migraine prevention:  *** Migraine rescue:  *** Limit use of pain relievers to no more than 2 days out of week to prevent risk of rebound or medication-overuse headache. Keep headache diary Follow up ***   Subjective:  Mary Cochran is a 25 year old right-handed female with Gilbert's syndrome, IBS and depression and anxiety who follows up for migraines   UPDATE: Migraines are moderate to severe, lasting 3 to 4 hours and occurring about once a month.  She hasn't tried the [place] yet.  She does have mild headaches 3 to 4 days a month, associated with neck and bilateral shoulder/upper back pain.   Rescue protocol:  Two Tylenol or two ibuprofen with essential oils or rest in bathtub or in bed and rest. Current NSAIDS:  none Current analgesics:  acetaminophen Current triptans:  none Current anti-nausea:  no Current muscle relaxants: none Current Antihypertensive medications:  no Current Antidepressant medications:  sertraline '50mg'$  at bedtime, Vyvanse Current Anticonvulsant medications:  no Current CGRP inhibitor:  Ubrelvy '100mg'$  Current Vitamins/Herbal/Supplements:  MVI Current Antihistamines/Decongestants:  hydroxyzine (anxiety) Birth control: None   Caffeine:  Hot tea daily.  Tries to avoid due to IBS Alcohol:  no Smoker:  no Diet:  Tries to eat healthy.  Drinks water Exercise:  no Depression/stress:  varies Sleep hygiene:  good She is not breastfeeding.   HISTORY: Onset:  Around age 7.   Location:  holocephalic Quality:  pressure Initial intensity:  8/10 Aura:  Preceded by visual aura "like a lava lamp" in the temporal half of visual field in right eye, lasting 30 to 60 minutes Prodrome:  no Associated symptoms:  Photophobia, phonophobia, sometimes nausea.  No  vomiting. Initial Duration:  2-3 hours Initial Frequency:  2 to 4 days a month Triggers/exacerbating factors:  Stress, neck and shoulder pain Relieving factors:  Laying down in dark, cold compress on back of head while sitting in warm bath Activity:  Cannot function Sometimes just visual aura without headache   12/19/2014 MRI BRAIN WO (performed due to worsening headache):  Negative.   Past NSAIDs:  Advil; naproxen; ketorolac Past analgesics:  Excedrin Past triptans:  Sumatriptan '100mg'$ ; Maxalt '10mg'$ ; Zomig '5mg'$  NS Past anti-emetic:  Zofran ODT '8mg'$  Past muscle relaxant:  tizanidine Past antihypertensive:  None Past antidepressant:  Lexapro Past antiepileptic:  topiramate (increased headache) Past CGRP inhibitor:  None   Family history of headache:  Mother  PAST MEDICAL HISTORY: Past Medical History:  Diagnosis Date   ALLERGIC RHINITIS 12/12/2006   Allergy    SEASONAL   Anxiety    Depression 10/03/2011   GILBERT'S SYNDROME 03/07/2010   Headache    Migraines   IBS (irritable bowel syndrome) 10/04/2015   INSOMNIA-SLEEP DISORDER-UNSPEC 04/18/2007   Tubular adenoma of colon 10/2014    MEDICATIONS: Current Outpatient Medications on File Prior to Visit  Medication Sig Dispense Refill   VYVANSE 20 MG capsule TAKE ONE CAPSULE BY MOUTH EVERY DAY BEFORE BREAKFAST 30 capsule 0   acetaminophen (TYLENOL) 325 MG tablet Take 2 tablets (650 mg total) by mouth every 6 (six) hours as needed (for pain scale < 4). 60 tablet 0   hydrOXYzine (VISTARIL) 25 MG capsule Take 1 capsule (25 mg total) by mouth every 8 (eight) hours as needed. 30 capsule 0   lisdexamfetamine (VYVANSE) 20 MG capsule Take 1 capsule (20  mg total) by mouth daily before breakfast. 30 capsule 0   lisdexamfetamine (VYVANSE) 20 MG capsule Take 1 capsule (20 mg total) by mouth daily before breakfast. 30 capsule 0   LORazepam (ATIVAN) 0.5 MG tablet Take 1 tablet (0.5 mg total) by mouth 2 (two) times daily as needed for anxiety. 30  tablet 0   Multiple Vitamin (MULTIVITAMIN PO) Take by mouth.     Probiotic Product (PROBIOTIC PO) Take by mouth.     sertraline (ZOLOFT) 50 MG tablet Take 1 tablet (50 mg total) by mouth at bedtime. 90 tablet 0   Ubrogepant (UBRELVY) 100 MG TABS Take 1 tablet by mouth as needed (May repeat in 2 hours.  Maximum 2 tablets in 24 hours). 10 tablet 11   No current facility-administered medications on file prior to visit.    ALLERGIES: Allergies  Allergen Reactions   Amoxicillin Hives   Azithromycin Other (See Comments)    Severe stomach ache   Hydrocodone Other (See Comments)    GI upset   Morphine     Unknown, childhood reaction   Ciprofloxacin Hcl Hives    FAMILY HISTORY: Family History  Problem Relation Age of Onset   Breast cancer Maternal Grandmother    Heart disease Maternal Grandmother    Miscarriages / Stillbirths Maternal Grandmother    Pancreatic cancer Other    Kidney cancer Maternal Aunt    Heart disease Father    Heart disease Maternal Grandfather    Varicose Veins Maternal Grandfather    Varicose Veins Mother    Colon polyps Mother    Colon cancer Neg Hx    Esophageal cancer Neg Hx    Rectal cancer Neg Hx    Stomach cancer Neg Hx       Objective:  *** General: No acute distress.  Patient appears ***-groomed.   Head:  Normocephalic/atraumatic Eyes:  Fundi examined but not visualized Neck: supple, no paraspinal tenderness, full range of motion Heart:  Regular rate and rhythm Lungs:  Clear to auscultation bilaterally Back: No paraspinal tenderness Neurological Exam: alert and oriented to person, place, and time.  Speech fluent and not dysarthric, language intact.  CN II-XII intact. Bulk and tone normal, muscle strength 5/5 throughout.  Sensation to light touch intact.  Deep tendon reflexes 2+ throughout, toes downgoing.  Finger to nose testing intact.  Gait normal, Romberg negative.   Mary Clines, DO  CC: ***

## 2020-09-29 ENCOUNTER — Ambulatory Visit: Payer: Medicaid Other | Admitting: Neurology

## 2020-09-29 DIAGNOSIS — Z124 Encounter for screening for malignant neoplasm of cervix: Secondary | ICD-10-CM | POA: Diagnosis not present

## 2020-09-29 DIAGNOSIS — Z01419 Encounter for gynecological examination (general) (routine) without abnormal findings: Secondary | ICD-10-CM | POA: Diagnosis not present

## 2020-09-29 DIAGNOSIS — Z0000 Encounter for general adult medical examination without abnormal findings: Secondary | ICD-10-CM | POA: Diagnosis not present

## 2020-09-29 LAB — HM PAP SMEAR

## 2020-10-04 ENCOUNTER — Other Ambulatory Visit: Payer: Self-pay | Admitting: Family Medicine

## 2020-10-04 NOTE — Telephone Encounter (Signed)
Will refill x 1 month  Needs appt for further refills

## 2020-10-11 DIAGNOSIS — H5203 Hypermetropia, bilateral: Secondary | ICD-10-CM | POA: Diagnosis not present

## 2020-11-01 ENCOUNTER — Other Ambulatory Visit: Payer: Self-pay | Admitting: Physician Assistant

## 2020-11-01 NOTE — Telephone Encounter (Signed)
Pt requesting refill on Vyvanse. Last OV 06/2020 with Dr. Jerline Pain. She saw you last 03/2020 for ADHD f/u.

## 2020-11-02 NOTE — Telephone Encounter (Signed)
30 day script sent  Needs office visit for further refills

## 2020-11-30 ENCOUNTER — Other Ambulatory Visit: Payer: Self-pay | Admitting: Physician Assistant

## 2020-12-01 NOTE — Progress Notes (Signed)
Mary Cochran is a 25 y.o. female here for medication refill.  History of Present Illness:   Chief Complaint  Patient presents with   ADHD    HPI  ADHD Currently compliant with taking vyvanse 20 mg with no adverse effects. She is managing well.   Anxiety/Depression Currently compliant with taking zoloft 50 mg without adverse efffects. She is only taking ativan 0.5 mg as needed, but tries to not take this medication as often. She is experiencing anxiety but believes it to be situational due to being a mother. Currently managing well.   Irregular Menstrual Cycles Mary Cochran recently has has an IUD inserted and has been noticing irregular cycles as well as changes in her flow. She has taken multiple pregnancy tests that resulted as negative. States she has had increased stress but believes her body is get accustomed to her first IUD. Not concerned enough to have any blood work done today.   Chronic neck/back pain [name] has been experiencing shoulder and neck pain for the past couple of months. She does have a hx of bulging lumbar discs. Currently she visits a chiropractor and participates in massages. She finds this helpful but finds herself still waking up in the middle of the night occasionally to take tylenol or ibuprofen. She is interested in exploring more solutions or forms of treatment. Denies urinary/bowel incontinence, numbness/tingling.   Past Medical History:  Diagnosis Date   ALLERGIC RHINITIS 12/12/2006   Allergy    SEASONAL   Anxiety    Depression 10/03/2011   GILBERT'S SYNDROME 03/07/2010   Headache    Migraines   IBS (irritable bowel syndrome) 10/04/2015   INSOMNIA-SLEEP DISORDER-UNSPEC 04/18/2007   Tubular adenoma of colon 10/2014     Social History   Tobacco Use   Smoking status: Never   Smokeless tobacco: Never  Vaping Use   Vaping Use: Never used  Substance Use Topics   Alcohol use: Yes    Alcohol/week: 0.0 standard drinks    Comment: OCC.   Drug  use: No    Past Surgical History:  Procedure Laterality Date   COLONOSCOPY  2016   MOUTH SURGERY     TONSILLECTOMY      Family History  Problem Relation Age of Onset   Breast cancer Maternal Grandmother    Heart disease Maternal Grandmother    Miscarriages / Stillbirths Maternal Grandmother    Pancreatic cancer Other    Kidney cancer Maternal Aunt    Heart disease Father    Heart disease Maternal Grandfather    Varicose Veins Maternal Grandfather    Varicose Veins Mother    Colon polyps Mother    Colon cancer Neg Hx    Esophageal cancer Neg Hx    Rectal cancer Neg Hx    Stomach cancer Neg Hx     Allergies  Allergen Reactions   Amoxicillin Hives   Azithromycin Other (See Comments)    Severe stomach ache   Hydrocodone Other (See Comments)    GI upset   Morphine     Unknown, childhood reaction   Ciprofloxacin Hcl Hives    Current Medications:   Current Outpatient Medications:    acetaminophen (TYLENOL) 325 MG tablet, Take 2 tablets (650 mg total) by mouth every 6 (six) hours as needed (for pain scale < 4)., Disp: 60 tablet, Rfl: 0   lisdexamfetamine (VYVANSE) 20 MG capsule, Take 1 capsule (20 mg total) by mouth daily before breakfast., Disp: 30 capsule, Rfl: 0   [START ON  01/01/2021] lisdexamfetamine (VYVANSE) 20 MG capsule, Take 1 capsule (20 mg total) by mouth daily before breakfast., Disp: 30 capsule, Rfl: 0   [START ON 01/31/2021] lisdexamfetamine (VYVANSE) 20 MG capsule, Take 1 capsule (20 mg total) by mouth daily before breakfast., Disp: 30 capsule, Rfl: 0   Multiple Vitamin (MULTIVITAMIN PO), Take by mouth., Disp: , Rfl:    Probiotic Product (PROBIOTIC PO), Take by mouth., Disp: , Rfl:    spironolactone (ALDACTONE) 100 MG tablet, Take 100 mg by mouth daily., Disp: , Rfl:    Ubrogepant (UBRELVY) 100 MG TABS, Take 1 tablet by mouth as needed (May repeat in 2 hours.  Maximum 2 tablets in 24 hours)., Disp: 10 tablet, Rfl: 11   sertraline (ZOLOFT) 50 MG tablet, Take  1 tablet (50 mg total) by mouth at bedtime., Disp: 90 tablet, Rfl: 3   Review of Systems:   ROS Negative unless otherwise specified per HPI.  Vitals:   Vitals:   12/02/20 1145  BP: 100/66  Pulse: 89  Temp: 98.2 F (36.8 C)  TempSrc: Temporal  SpO2: 97%  Weight: 111 lb (50.3 kg)  Height: 5\' 6"  (1.676 m)     Body mass index is 17.92 kg/m.  Physical Exam:   Physical Exam Vitals and nursing note reviewed.  Constitutional:      General: She is not in acute distress.    Appearance: She is well-developed. She is not ill-appearing or toxic-appearing.  Cardiovascular:     Rate and Rhythm: Normal rate and regular rhythm.     Pulses: Normal pulses.     Heart sounds: Normal heart sounds, S1 normal and S2 normal.  Pulmonary:     Effort: Pulmonary effort is normal.     Breath sounds: Normal breath sounds.  Skin:    General: Skin is warm and dry.  Neurological:     Mental Status: She is alert.     GCS: GCS eye subscore is 4. GCS verbal subscore is 5. GCS motor subscore is 6.  Psychiatric:        Speech: Speech normal.        Behavior: Behavior normal. Behavior is cooperative.    Assessment and Plan:   Chronic midline back pain, unspecified back location Chronic No red flags Referral to PT for possible dry needling or other treatment as determined by PT Follow-up as needed  Anxiety Stable Continue zoloft 50 mg daily Follow-up in 6 months, sooner if concerned  Attention and concentration deficit Stable Continue vyvanse 20 mg daily Follow-up in 6 months, sooner if concerned  Amenorrhea Declines further work-up at this time Suspect possibly due to recent IUD placement Follow-up with [place] or ob-gyn as needed  I,Havlyn C Ratchford,acting as a scribe for [employer], PA.,have documented all relevant documentation on the behalf of [name], PA,as directed by  [name], PA while in the presence of [name], [place].  I, [name], [place], have  reviewed all documentation for this visit. The documentation on 12/02/20 for the exam, diagnosis, procedures, and orders are all accurate and complete.  [name], PA-C

## 2020-12-02 ENCOUNTER — Other Ambulatory Visit: Payer: Self-pay

## 2020-12-02 ENCOUNTER — Encounter: Payer: Self-pay | Admitting: Physician Assistant

## 2020-12-02 ENCOUNTER — Ambulatory Visit (INDEPENDENT_AMBULATORY_CARE_PROVIDER_SITE_OTHER): Payer: Medicaid Other | Admitting: Physician Assistant

## 2020-12-02 VITALS — BP 100/66 | HR 89 | Temp 98.2°F | Ht 66.0 in | Wt 111.0 lb

## 2020-12-02 DIAGNOSIS — G8929 Other chronic pain: Secondary | ICD-10-CM

## 2020-12-02 DIAGNOSIS — N912 Amenorrhea, unspecified: Secondary | ICD-10-CM

## 2020-12-02 DIAGNOSIS — R41840 Attention and concentration deficit: Secondary | ICD-10-CM | POA: Diagnosis not present

## 2020-12-02 DIAGNOSIS — M549 Dorsalgia, unspecified: Secondary | ICD-10-CM

## 2020-12-02 DIAGNOSIS — F419 Anxiety disorder, unspecified: Secondary | ICD-10-CM | POA: Diagnosis not present

## 2020-12-02 MED ORDER — SERTRALINE HCL 50 MG PO TABS
50.0000 mg | ORAL_TABLET | Freq: Every day | ORAL | 3 refills | Status: DC
Start: 2020-12-02 — End: 2022-01-24

## 2020-12-02 MED ORDER — LISDEXAMFETAMINE DIMESYLATE 20 MG PO CAPS: 20.0000 mg | ORAL_CAPSULE | Freq: Every day | ORAL | 0 refills | Status: DC

## 2020-12-20 ENCOUNTER — Other Ambulatory Visit: Payer: Self-pay

## 2020-12-20 ENCOUNTER — Ambulatory Visit (HOSPITAL_BASED_OUTPATIENT_CLINIC_OR_DEPARTMENT_OTHER): Payer: Medicaid Other | Attending: Physician Assistant | Admitting: Physical Therapy

## 2020-12-20 ENCOUNTER — Encounter (HOSPITAL_BASED_OUTPATIENT_CLINIC_OR_DEPARTMENT_OTHER): Payer: Self-pay | Admitting: Physical Therapy

## 2020-12-20 DIAGNOSIS — R293 Abnormal posture: Secondary | ICD-10-CM | POA: Insufficient documentation

## 2020-12-20 DIAGNOSIS — M546 Pain in thoracic spine: Secondary | ICD-10-CM | POA: Insufficient documentation

## 2020-12-20 DIAGNOSIS — G8929 Other chronic pain: Secondary | ICD-10-CM | POA: Insufficient documentation

## 2020-12-20 DIAGNOSIS — M542 Cervicalgia: Secondary | ICD-10-CM | POA: Insufficient documentation

## 2020-12-20 DIAGNOSIS — M549 Dorsalgia, unspecified: Secondary | ICD-10-CM | POA: Insufficient documentation
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
## 2020-12-22 NOTE — Progress Notes (Signed)
Mary Cochran is a 25 y.o. female here for a productive cough.  History of Present Illness:   Chief Complaint  Patient presents with   Cough    Pt stated cough started on Wed and fever 100-103. She is taking Tylenol every 4-6 hrs. Son was sick on Sunday took to Dr given antibiotics.    HPI  Productive cough Mary Cochran presents with c/o a productive cough that has been onset for two days. After beginning to feel hot, she measured her temperature which read about 100 degrees, highest being 103. In addition to having a cough and fever, she has also stated she feels pain in her lungs due to the cough. In an effort to improve sx she has been taking OTC mucinex , zicam, and tylenol every 4-6 hours.   Mary Cochran says these medications have provided minor relief, with the mucinex believed to help her cough up yellow colored sputum with cottage like consistency. Pt does admit that her son was sick the Sunday prior to her sx beginning. Denies chills, CP, nausea, vomiting, diarrhea, or SOB.   While in office, pt did test positive for Influenza Type A.   Past Medical History:  Diagnosis Date   ALLERGIC RHINITIS 12/12/2006   Allergy    SEASONAL   Anxiety    Depression 10/03/2011   GILBERT'S SYNDROME 03/07/2010   Headache    Migraines   IBS (irritable bowel syndrome) 10/04/2015   INSOMNIA-SLEEP DISORDER-UNSPEC 04/18/2007   Tubular adenoma of colon 10/2014     Social History   Tobacco Use   Smoking status: Never   Smokeless tobacco: Never  Vaping Use   Vaping Use: Never used  Substance Use Topics   Alcohol use: Yes    Alcohol/week: 0.0 standard drinks    Comment: OCC.   Drug use: No    Past Surgical History:  Procedure Laterality Date   COLONOSCOPY  2016   MOUTH SURGERY     TONSILLECTOMY      Family History  Problem Relation Age of Onset   Breast cancer Maternal Grandmother    Heart disease Maternal Grandmother    Miscarriages / Stillbirths Maternal Grandmother    Pancreatic  cancer Other    Kidney cancer Maternal Aunt    Heart disease Father    Heart disease Maternal Grandfather    Varicose Veins Maternal Grandfather    Varicose Veins Mother    Colon polyps Mother    Colon cancer Neg Hx    Esophageal cancer Neg Hx    Rectal cancer Neg Hx    Stomach cancer Neg Hx     Allergies  Allergen Reactions   Amoxicillin Hives   Azithromycin Other (See Comments)    Severe stomach ache   Hydrocodone Other (See Comments)    GI upset   Morphine     Unknown, childhood reaction   Ciprofloxacin Hcl Hives    Current Medications:   Current Outpatient Medications:    acetaminophen (TYLENOL) 325 MG tablet, Take 2 tablets (650 mg total) by mouth every 6 (six) hours as needed (for pain scale < 4)., Disp: 60 tablet, Rfl: 0   albuterol (VENTOLIN HFA) 108 (90 Base) MCG/ACT inhaler, Inhale 2 puffs into the lungs every 6 (six) hours as needed for wheezing or shortness of breath., Disp: 8 g, Rfl: 2   lisdexamfetamine (VYVANSE) 20 MG capsule, Take 1 capsule (20 mg total) by mouth daily before breakfast., Disp: 30 capsule, Rfl: 0   [START ON 01/01/2021] lisdexamfetamine (VYVANSE)  20 MG capsule, Take 1 capsule (20 mg total) by mouth daily before breakfast., Disp: 30 capsule, Rfl: 0   [START ON 01/31/2021] lisdexamfetamine (VYVANSE) 20 MG capsule, Take 1 capsule (20 mg total) by mouth daily before breakfast., Disp: 30 capsule, Rfl: 0   Multiple Vitamin (MULTIVITAMIN PO), Take by mouth., Disp: , Rfl:    Probiotic Product (PROBIOTIC PO), Take by mouth., Disp: , Rfl:    sertraline (ZOLOFT) 50 MG tablet, Take 1 tablet (50 mg total) by mouth at bedtime., Disp: 90 tablet, Rfl: 3   spironolactone (ALDACTONE) 100 MG tablet, Take 100 mg by mouth daily., Disp: , Rfl:    Ubrogepant (UBRELVY) 100 MG TABS, Take 1 tablet by mouth as needed (May repeat in 2 hours.  Maximum 2 tablets in 24 hours)., Disp: 10 tablet, Rfl: 11   Review of Systems:   ROS Negative unless otherwise specified per  HPI. Vitals:   Vitals:   12/23/20 0856  BP: 100/62  Pulse: 77  Temp: 97.6 F (36.4 C)  TempSrc: Temporal  SpO2: 98%  Weight: 112 lb 4 oz (50.9 kg)  Height: 5\' 6"  (1.676 m)     Body mass index is 18.12 kg/m.  Physical Exam:   Physical Exam Vitals and nursing note reviewed.  Constitutional:      General: She is not in acute distress.    Appearance: She is well-developed. She is not ill-appearing or toxic-appearing.  Cardiovascular:     Rate and Rhythm: Normal rate and regular rhythm.     Pulses: Normal pulses.     Heart sounds: Normal heart sounds, S1 normal and S2 normal.  Pulmonary:     Effort: Pulmonary effort is normal.     Breath sounds: Normal breath sounds.  Skin:    General: Skin is warm and dry.  Neurological:     Mental Status: She is alert.     GCS: GCS eye subscore is 4. GCS verbal subscore is 5. GCS motor subscore is 6.  Psychiatric:        Speech: Speech normal.        Behavior: Behavior normal. Behavior is cooperative.   Results for orders placed or performed in visit on 12/23/20  POC Influenza A&B(BINAX/QUICKVUE)  Result Value Ref Range   Influenza A, POC Positive (A) Negative   Influenza B, POC Negative Negative  POC COVID-19  Result Value Ref Range   SARS Coronavirus 2 Ag Negative Negative    Assessment and Plan:   Cough, unspecified type; Fever, unspecified fever cause Flu test positive for influenza A COVID test negative No red flags Push fluids and rest Albuterol inhaler sent in Declines Tamiflu Rec OTC delsym 12 hour Worsening precautions advised  I,Havlyn C Ratchford,acting as a scribe for [employer], PA.,have documented all relevant documentation on the behalf of [name], PA,as directed by  [name], PA while in the presence of [name], [place].  I, [name], [place], have reviewed all documentation for this visit. The documentation on 12/23/20 for the exam, diagnosis, procedures, and orders are all  accurate and complete.   [name], PA-C

## 2020-12-23 ENCOUNTER — Encounter: Payer: Self-pay | Admitting: Physician Assistant

## 2020-12-23 ENCOUNTER — Ambulatory Visit (INDEPENDENT_AMBULATORY_CARE_PROVIDER_SITE_OTHER): Payer: Medicaid Other | Admitting: Physician Assistant

## 2020-12-23 VITALS — BP 100/62 | HR 77 | Temp 97.6°F | Ht 66.0 in | Wt 112.2 lb

## 2020-12-23 DIAGNOSIS — R509 Fever, unspecified: Secondary | ICD-10-CM

## 2020-12-23 DIAGNOSIS — R059 Cough, unspecified: Secondary | ICD-10-CM | POA: Diagnosis not present

## 2020-12-23 LAB — POC INFLUENZA A&B (BINAX/QUICKVUE)
Influenza A, POC: POSITIVE — AB
Influenza B, POC: NEGATIVE

## 2020-12-23 LAB — POC COVID19 BINAXNOW: SARS Coronavirus 2 Ag: NEGATIVE

## 2020-12-23 MED ORDER — ALBUTEROL SULFATE HFA 108 (90 BASE) MCG/ACT IN AERS: 2.0000 | INHALATION_SPRAY | Freq: Four times a day (QID) | RESPIRATORY_TRACT | 2 refills | Status: DC | PRN

## 2020-12-23 NOTE — Patient Instructions (Addendum)
It was great to see you!  Albuterol inhaler sent in  You have a viral upper respiratory infection -- INFLUENZA A Antibiotics are not needed for this.  Viral infections usually take 7-10 days to resolve.  The cough can last a few weeks to go away.      Push fluids and get plenty of rest. Please return if you are not improving as expected, or if you have high fevers (>101.5) or difficulty swallowing or worsening productive cough.  Call clinic with questions.  I hope you start feeling better soon!

## 2020-12-26 ENCOUNTER — Encounter (HOSPITAL_BASED_OUTPATIENT_CLINIC_OR_DEPARTMENT_OTHER): Payer: Self-pay | Admitting: Physical Therapy

## 2020-12-26 ENCOUNTER — Ambulatory Visit (HOSPITAL_BASED_OUTPATIENT_CLINIC_OR_DEPARTMENT_OTHER): Payer: Medicaid Other | Admitting: Physical Therapy

## 2020-12-26 ENCOUNTER — Other Ambulatory Visit: Payer: Self-pay

## 2020-12-26 DIAGNOSIS — M546 Pain in thoracic spine: Secondary | ICD-10-CM

## 2020-12-26 DIAGNOSIS — M542 Cervicalgia: Secondary | ICD-10-CM | POA: Diagnosis not present

## 2020-12-26 DIAGNOSIS — R293 Abnormal posture: Secondary | ICD-10-CM

## 2020-12-26 NOTE — Telephone Encounter (Signed)
Left message on voicemail to call office.  

## 2020-12-26 NOTE — Therapy (Signed)
OUTPATIENT PHYSICAL THERAPY TREATMENT NOTE   Patient Name: Mary Cochran MRN: [id number] DOB:25-Jul-1995, 25 y.o., female Today's Date: 12/26/2020  PCP: [name], [place] REFERRING PROVIDER: [name], PA   PT End of Session - 12/26/20 1523     Visit Number 2    Number of Visits 12    Date for PT Re-Evaluation 01/31/21    Authorization Type [employer] MCD    PT Start Time 6440    PT Stop Time 3474    PT Time Calculation (min) 38 min    Activity Tolerance Patient tolerated treatment well    Behavior During Therapy [facility] for tasks assessed/performed             Past Medical History:  Diagnosis Date   ALLERGIC RHINITIS 12/12/2006   Allergy    SEASONAL   Anxiety    Depression 10/03/2011   GILBERT'S SYNDROME 03/07/2010   Headache    Migraines   IBS (irritable bowel syndrome) 10/04/2015   INSOMNIA-SLEEP DISORDER-UNSPEC 04/18/2007   Tubular adenoma of colon 10/2014   Past Surgical History:  Procedure Laterality Date   COLONOSCOPY  2016   MOUTH SURGERY     TONSILLECTOMY     Patient Active Problem List   Diagnosis Date Noted   Nonallopathic lesion of sacral region 10/26/2015   Nonallopathic lesion of thoracic region 10/26/2015   Nonallopathic lesion of lumbosacral region 10/26/2015   Scapular dysfunction 10/26/2015   IBS (irritable bowel syndrome) 10/04/2015   Adenomatous polyp of colon 10/04/2015   Polyarthralgia 10/04/2015   Eczema of both hands 09/30/2013   Skin lesion 10/16/2012   Vertigo 04/06/2012   Migraine with aura 04/06/2012   Anxiety 10/30/2011   Depression 10/03/2011   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   SYNCOPE 10/07/2008   INSOMNIA-SLEEP DISORDER-UNSPEC 04/18/2007   ALLERGIC RHINITIS 12/12/2006   REFERRING PROVIDER: [name], PA  REFERRING DIAG: [phone] (ICD-10-CM) - Chronic midline back pain, unspecified back location   THERAPY DIAG:  Cervicalgia  Pain in thoracic spine  Abnormal posture     ONSET DATE:  Exacerbation 3 years ago; 12/02/2020 MD visit/script   SUBJECTIVE:                                                                                                                                                                                                          SUBJECTIVE STATEMENT: -Pt currently sees a chiropractor and has deep tissue massages which provide relief for only one day.  PA script indicated pt is interested in possible dry needling  -Pt has pain  with and is limited with carrying children.  Pt states she has bad posture and feels hunched over all the time. Pt states she has increased pain when she tries to correct her posture.  Pt has disturbed sleep due to pain and is unable to get comfortable in the bed.  Pt c/o's of H/A's from tension also.  Pt reports increased pain with increased stress.  Pt thinks her sx's are worsening.  Pt states she has felt some tingling in R UE when lying down.  -Pt states she came down with the flu last week.  She has been performing her HEP when she was feeling better.  Pt reports no increased pain with HEP.  Pt denies any adverse effects after prior Rx.  Pt states she has occasional R > L sided TMJ issues.    PERTINENT HISTORY:  Chronic pain.  Hx of a lumbar bulging disc.  Migraines with aura, syncope, and vertigo.     PAIN:  Are you having pain? Yes NPRS scale: 3/10 current, 8-9/10 worst, 3/10 best Pain location: cervical, bilat UT PAIN TYPE: aching and tense Pain description: constant  Aggravating factors: stress, lifting children Relieving factors: lidocaine roll on which helps more than CBD oil.  Warm bath. Heat pad.  Chiropractor.  Massage. Theragun.  (All short term relief)   PRECAUTIONS: None      PLOF: Independent; Pt has a hx of chronic pain though was able to perform her daily activities with less pain prior to this exacerbation 3 years ago.     PATIENT GOALS to sleep better.  Improved pain, "not be so miserable"   OBJECTIVE:                 POSTURE:  FHP and rounded shoulders     PALPATION:  TTP:  bilat UT, cervical and thoracic paraspinals. Pt had moderate soft tissue tightness in bilat UT.       TODAY'S TREATMENT:  Therapeutic Exercise:  -Reviewed response to prior Rx, HEP compliance, and current function.   -Reviewed and performed HEP. -Pt performed -chin tucks 2x10 reps -scap retraction 2x10reps with 3 sec hold.  -rows with retraction with RTB 2x10 -standing shoulder extension with retraction with RTB 2x10 -standing shoulder horizontal abduction with YTB 2x10 reps with cuing to not hyperextend elbows   Manual Therapy:   -Pt received STM with MFR to bilat UT and IASTM to bilat UT to improve pain and soft tissue tightness and to reduce myofascial restrictions and adhesions.   PATIENT EDUCATION:  Education details: rationale of Rx, exercise form, and POC.  Correct posture.  Reviewed and performed HEP.  Answered Pt's questions.   Person educated: Patient Education method: Explanation, Demonstration, Tactile cues, and Verbal cues Education comprehension: verbalized understanding, returned demonstration, verbal cues required, and tactile cues required     HOME EXERCISE PROGRAM: Access Code: [email or web address] URL: [email or web address] Date: 12/20/2020 Prepared by: Ronny Flurry   Exercises Seated Scapular Retraction - 2 x daily - 7 x weekly - 1-2 sets - 10 reps Seated Cervical Retraction - 2 x daily - 7 x weekly - 2 sets - 10 reps     ASSESSMENT:   CLINICAL IMPRESSION: Pt has tightness in bilat UT and tolerated STW well.  Pt had an appropriate response to IASTM without any petichiae observed.  Pt performed exercises well with cuing and instruction in correct form.  Pt responded well to Rx reporting improved pain to 2/10 after Rx.  Pt should benefit from skilled PT  to address goals and impairments and to restore desired level of function.       Objective impairments include weakness,  decreased activity tolerance, increased muscle spasms, impaired flexibility, postural dysfunction, and pain. These impairments are limiting patient from cleaning and taking care of children . Personal factors including Time since onset of injury/illness/exacerbation and 1 comorbidity: migraines with aura  are also affecting patient's functional outcome.        GOALS:     SHORT TERM GOALS:   STG Name Target Date Goal status  1 Pt will be independent and compliant with HEP for improved pain, posture, tolerance to activity, and function.  Baseline:  01/03/2021 INITIAL  2 Pt will report at least a 25% improvement in pain and sx's overall Baseline:  01/10/2021 INITIAL  3 Pt will demo improved posture while in the clinic and report she is able to maintain improved posture outside of the clinic with reduced pain.   Baseline: 01/10/2021 INITIAL  4 Pt will demo improved tightness in bilat UT for reduced tension and pain with her daily activities and household chores. Baseline: 01/18/2021 INITIAL    LONG TERM GOALS:    LTG Name Target Date Goal status  1 Pt will be able to sleep at least 4-5 nights per week without pain waking her up.  Baseline: 01/31/2021 INITIAL  2 Pt will report at least a 70% improvement in pain and sx's overall Baseline: 01/31/2021 INITIAL  3 Pt will report she is able to carry her children and take care of them without significant pain or difficulty.  Baseline: 01/31/2021 INITIAL  4 Pt will be able to perform her household chores and IADLs without significant pain or difficulty.  Baseline: 01/31/2021 INITIAL                               PLAN: PT FREQUENCY: 2x/week   PT DURATION: 6 weeks   PLANNED INTERVENTIONS: Therapeutic exercises, Therapeutic activity, Neuro Muscular re-education, Patient/Family education, Joint mobilization, Aquatic Therapy, Dry Needling, Electrical stimulation, Spinal mobilization, Cryotherapy, Moist heat, Taping, Ultrasound, and Manual  therapy   PLAN FOR NEXT SESSION: Manual techniques including STM and IASTM.  Postural/scap stab's.  Review and perform HEP.   Progress HEP next visit if pt tolerates exercises well.  Consider dry needling  Selinda Michaels III PT, DPT 12/27/20 12:11 AM

## 2021-01-02 ENCOUNTER — Encounter: Payer: Self-pay | Admitting: Physician Assistant

## 2021-01-03 ENCOUNTER — Encounter: Payer: Self-pay | Admitting: Physician Assistant

## 2021-01-03 ENCOUNTER — Ambulatory Visit (INDEPENDENT_AMBULATORY_CARE_PROVIDER_SITE_OTHER): Payer: Medicaid Other

## 2021-01-03 ENCOUNTER — Other Ambulatory Visit: Payer: Self-pay

## 2021-01-03 ENCOUNTER — Ambulatory Visit (INDEPENDENT_AMBULATORY_CARE_PROVIDER_SITE_OTHER): Payer: Medicaid Other | Admitting: Family Medicine

## 2021-01-03 ENCOUNTER — Encounter: Payer: Self-pay | Admitting: Family Medicine

## 2021-01-03 ENCOUNTER — Ambulatory Visit (HOSPITAL_BASED_OUTPATIENT_CLINIC_OR_DEPARTMENT_OTHER): Payer: Medicaid Other | Admitting: Physical Therapy

## 2021-01-03 ENCOUNTER — Ambulatory Visit (INDEPENDENT_AMBULATORY_CARE_PROVIDER_SITE_OTHER): Payer: Medicaid Other | Admitting: Physician Assistant

## 2021-01-03 VITALS — BP 104/70 | HR 78 | Temp 97.9°F | Ht 66.0 in | Wt 110.4 lb

## 2021-01-03 VITALS — BP 104/70 | HR 78 | Ht 66.0 in | Wt 110.4 lb

## 2021-01-03 DIAGNOSIS — M546 Pain in thoracic spine: Secondary | ICD-10-CM

## 2021-01-03 DIAGNOSIS — R0781 Pleurodynia: Secondary | ICD-10-CM

## 2021-01-03 DIAGNOSIS — R293 Abnormal posture: Secondary | ICD-10-CM

## 2021-01-03 DIAGNOSIS — S2232XA Fracture of one rib, left side, initial encounter for closed fracture: Secondary | ICD-10-CM | POA: Diagnosis not present

## 2021-01-03 DIAGNOSIS — M542 Cervicalgia: Secondary | ICD-10-CM

## 2021-01-03 MED ORDER — TIZANIDINE HCL 4 MG PO TABS: 4.0000 mg | ORAL_TABLET | Freq: Three times a day (TID) | ORAL | 1 refills | Status: DC | PRN

## 2021-01-03 NOTE — Patient Instructions (Addendum)
Nice to meet you today.  Use heat.  You can use a rib binder or some type of compression to help w/ pain.  If you don't like it or if it causes more pain than it is helpful, don't use.  I've prescribed you Tizanidine to use for muscle spasms.  Please get an Xray today before you leave.  Follow-up: as needed

## 2021-01-03 NOTE — Progress Notes (Signed)
   I, Mary Cochran, LAT, ATC, am serving as scribe for Dr. Lynne Cochran.  Mary Cochran is a 25 y.o. female who presents to [place] [facility] today for L-sided rib pain Pt was previously seen by Dr. Georgina Cochran on 09/07/20 for a contusion of her R forearm. Today, pt c/o L-sided rib pain x 4 days since 12/30/20. Pt attributes pain to constant coughing from a prior cold. Pt does has a hx of bulging lumbar discs, neck and back pain, and is currently being treated w/ PT by referral from PCP. Pt locates pain to her L ant and lateral ribcage.  Aggravates: deep breathing, especially on inhalation;  bending in any directions, laying supine; coughing; sneezing;  Treatments tried: Tylenol; heat   Pertinent review of systems: No fevers or chills  Relevant historical information: Thin.  History of scapular dysfunction.  No history of known low bone mineral density.   Exam:  BP 104/70 (BP Location: Left Arm, Patient Position: Sitting, Cuff Size: Normal)   Pulse 78   Ht 5\' 6"  (1.676 m)   Wt 110 lb 6.4 oz (50.1 kg)   LMP 12/21/2020 (Exact Date)   SpO2 97%   BMI 17.82 kg/m  General: Well Developed, well nourished, and in no acute distress.  Lungs: Clear to auscultation bilaterally.  Normal work of breathing MSK: Left lower rib lateral rib margin tender palpation.    Lab and Radiology Results  X-ray images left ribs with chest obtained today personally and independently interpreted No acute fracture.  Skin marker is located overlying the rib cartilage junction at the left inferior lateral false rib Await formal radiology review   Assessment and Plan: 25 y.o. female with left rib pain with cough.  I suspect Mary Cochran suffered an injury of the rib cartilage junction at the left inferior false rib as that is the location of her pain on x-ray.  This will hurt as badly as a true rib fracture and may take as long to heal.  Plan to use rib binder maximum dose NSAID and Tylenol.   Also recommend and prescribed muscle relaxer at bedtime.  Recommend heating pad.  Recheck as needed.   PDMP not reviewed this encounter. Orders Placed This Encounter  Procedures   DG Ribs Unilateral W/Chest Left    Standing Status:   Future    Standing Expiration Date:   02/02/2021    Order Specific Question:   Reason for Exam (SYMPTOM  OR DIAGNOSIS REQUIRED)    Answer:   rib pain    Order Specific Question:   Is patient pregnant?    Answer:   No    Order Specific Question:   Preferred imaging location?    Answer:   [facility]   Meds ordered this encounter  Medications   tiZANidine (ZANAFLEX) 4 MG tablet    Sig: Take 1 tablet (4 mg total) by mouth every 8 (eight) hours as needed for muscle spasms.    Dispense:  30 tablet    Refill:  1     Discussed warning signs or symptoms. Please see discharge instructions. Patient expresses understanding.   The above documentation has been reviewed and is accurate and complete Mary Cochran, M.D.

## 2021-01-03 NOTE — Progress Notes (Signed)
Mary Cochran is a 25 y.o. female here for left sided pain.  History of Present Illness:   Chief Complaint  Patient presents with   c/o rib pain    Pt c/o left side Rib cage pain since Friday. She has been using Tylenol for pain.    HPI  Rib Pain [name] presents with c/o left sided rib pain that has been onset for four days. States she believed the pain to initially be caused form her constant coughing due to recent Influenza A (we saw her for this on 12/23/20 in office.) Her coughing has overall resolved however symptoms persist and are not improving.  She endorses sharp pain upon inhalation as well as discomfort when bending and sitting. The area is swollen and tender to touch. Denies redness, area of drainage or warmth.   She was to attend PT today however her PT would not do any exercises today due to her level of pain. Denies known trauma.   Past Medical History:  Diagnosis Date   ALLERGIC RHINITIS 12/12/2006   Allergy    SEASONAL   Anxiety    Depression 10/03/2011   GILBERT'S SYNDROME 03/07/2010   Headache    Migraines   IBS (irritable bowel syndrome) 10/04/2015   INSOMNIA-SLEEP DISORDER-UNSPEC 04/18/2007   Tubular adenoma of colon 10/2014     Social History   Tobacco Use   Smoking status: Never   Smokeless tobacco: Never  Vaping Use   Vaping Use: Never used  Substance Use Topics   Alcohol use: Yes    Alcohol/week: 0.0 standard drinks    Comment: OCC.   Drug use: No    Past Surgical History:  Procedure Laterality Date   COLONOSCOPY  2016   MOUTH SURGERY     TONSILLECTOMY      Family History  Problem Relation Age of Onset   Breast cancer Maternal Grandmother    Heart disease Maternal Grandmother    Miscarriages / Stillbirths Maternal Grandmother    Pancreatic cancer Other    Kidney cancer Maternal Aunt    Heart disease Father    Heart disease Maternal Grandfather    Varicose Veins Maternal Grandfather    Varicose Veins Mother    Colon polyps  Mother    Colon cancer Neg Hx    Esophageal cancer Neg Hx    Rectal cancer Neg Hx    Stomach cancer Neg Hx     Allergies  Allergen Reactions   Amoxicillin Hives   Azithromycin Other (See Comments)    Severe stomach ache   Hydrocodone Other (See Comments)    GI upset   Morphine     Unknown, childhood reaction   Ciprofloxacin Hcl Hives    Current Medications:   Current Outpatient Medications:    acetaminophen (TYLENOL) 325 MG tablet, Take 2 tablets (650 mg total) by mouth every 6 (six) hours as needed (for pain scale < 4)., Disp: 60 tablet, Rfl: 0   albuterol (VENTOLIN HFA) 108 (90 Base) MCG/ACT inhaler, Inhale 2 puffs into the lungs every 6 (six) hours as needed for wheezing or shortness of breath., Disp: 8 g, Rfl: 2   lisdexamfetamine (VYVANSE) 20 MG capsule, Take 1 capsule (20 mg total) by mouth daily before breakfast., Disp: 30 capsule, Rfl: 0   [START ON 01/31/2021] lisdexamfetamine (VYVANSE) 20 MG capsule, Take 1 capsule (20 mg total) by mouth daily before breakfast., Disp: 30 capsule, Rfl: 0   Multiple Vitamin (MULTIVITAMIN PO), Take by mouth., Disp: , Rfl:  Probiotic Product (PROBIOTIC PO), Take by mouth., Disp: , Rfl:    sertraline (ZOLOFT) 50 MG tablet, Take 1 tablet (50 mg total) by mouth at bedtime., Disp: 90 tablet, Rfl: 3   spironolactone (ALDACTONE) 100 MG tablet, Take 100 mg by mouth daily., Disp: , Rfl:    Ubrogepant (UBRELVY) 100 MG TABS, Take 1 tablet by mouth as needed (May repeat in 2 hours.  Maximum 2 tablets in 24 hours)., Disp: 10 tablet, Rfl: 11   lisdexamfetamine (VYVANSE) 20 MG capsule, Take 1 capsule (20 mg total) by mouth daily before breakfast., Disp: 30 capsule, Rfl: 0   Review of Systems:   ROS Negative unless otherwise specified per HPI.  Vitals:   Vitals:   01/03/21 1133  BP: 104/70  Pulse: 78  Temp: 97.9 F (36.6 C)  TempSrc: Temporal  SpO2: 97%  Weight: 110 lb 6.1 oz (50.1 kg)  Height: 5\' 6"  (1.676 m)     Body mass index is 17.82  kg/m.  Physical Exam:   Physical Exam Constitutional:      Appearance: Normal appearance. She is well-developed.  HENT:     Head: Normocephalic and atraumatic.  Eyes:     General: Lids are normal.     Extraocular Movements: Extraocular movements intact.     Conjunctiva/sclera: Conjunctivae normal.  Pulmonary:     Effort: Pulmonary effort is normal.  Chest:     Comments: Obvious protrusion to L lateral rib cage with significant TTP Musculoskeletal:        General: Normal range of motion.     Cervical back: Normal range of motion and neck supple.  Skin:    General: Skin is warm and dry.  Neurological:     Mental Status: She is alert and oriented to person, place, and time.  Psychiatric:        Attention and Perception: Attention and perception normal.        Mood and Affect: Mood normal.        Behavior: Behavior normal.        Thought Content: Thought content normal.        Judgment: Judgment normal.    Assessment and Plan:   Rib pain on left side Patient without obvious respiratory distress, vitals stable Our appointment was canceled today and she was referred urgently to sports medicine where she has an appointment secured at 12:45p today with Dr. Lynne Leader Will defer further work-up to him as well as recommendations for resumption of PT  No charge for appointment with me today  Nehemiah Massed C Ratchford,acting as a scribe for [name], PA.,have documented all relevant documentation on the behalf of [name], PA,as directed by  [name], PA while in the presence of [name], [place].  I, [name], [place], have reviewed all documentation for this visit. The documentation on 01/03/21 for the exam, diagnosis, procedures, and orders are all accurate and complete.  [name], PA-C

## 2021-01-03 NOTE — Therapy (Signed)
  OUTPATIENT PHYSICAL THERAPY TREATMENT NOTE   Patient Name: Mary Cochran MRN: [id number] DOB:Dec 20, 1995, 25 y.o., female Today's Date: 01/03/2021  PCP: [name], [place] REFERRING PROVIDER: [name], PA   PT End of Session - 01/03/21 1000     Visit Number 2    Number of Visits 12    Date for PT Re-Evaluation 01/31/21    Authorization Type [employer] MCD             Past Medical History:  Diagnosis Date   ALLERGIC RHINITIS 12/12/2006   Allergy    SEASONAL   Anxiety    Depression 10/03/2011   GILBERT'S SYNDROME 03/07/2010   Headache    Migraines   IBS (irritable bowel syndrome) 10/04/2015   INSOMNIA-SLEEP DISORDER-UNSPEC 04/18/2007   Tubular adenoma of colon 10/2014   Past Surgical History:  Procedure Laterality Date   COLONOSCOPY  2016   MOUTH SURGERY     TONSILLECTOMY     Patient Active Problem List   Diagnosis Date Noted   Nonallopathic lesion of sacral region 10/26/2015   Nonallopathic lesion of thoracic region 10/26/2015   Nonallopathic lesion of lumbosacral region 10/26/2015   Scapular dysfunction 10/26/2015   IBS (irritable bowel syndrome) 10/04/2015   Adenomatous polyp of colon 10/04/2015   Polyarthralgia 10/04/2015   Eczema of both hands 09/30/2013   Skin lesion 10/16/2012   Vertigo 04/06/2012   Migraine with aura 04/06/2012   Anxiety 10/30/2011   Depression 10/03/2011   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  She is not sure if she strained/tore a muscle or broke a rib from coughing.  She states she was doing a lot of coughing.   She began having severe pain on Friday and has not improved at all.  Pt c/o's of swelling in L sided trunk.    Pt does have swelling in L sided trunk.  Pt laid supine and showed PT her difference in rib cage.  Her lower Left sided rib cage is more protruded and prominent than R side.   Pt has an appt with PA today at 11:30 AM.  PT deferred Rx today due to pt sx's and presentation and will wait until pt is assessed by PA.      Selinda Michaels III PT, DPT 01/03/21 10:06 AM

## 2021-01-04 ENCOUNTER — Telehealth: Payer: Self-pay | Admitting: Family Medicine

## 2021-01-04 DIAGNOSIS — S2232XA Fracture of one rib, left side, initial encounter for closed fracture: Secondary | ICD-10-CM

## 2021-01-04 NOTE — Progress Notes (Signed)
X-ray does show a broken rib. Plan for bone density test like we discussed. Physical therapy reached out to me about this result already and knows about it.  Okay to proceed with PT.  They will be gentle.

## 2021-01-04 NOTE — Telephone Encounter (Signed)
Plan for bone density test.  Patient suffered a rib fracture with coughing.  BMI is 17.  I wonder if she has low bone mineral density.

## 2021-01-05 ENCOUNTER — Other Ambulatory Visit: Payer: Self-pay

## 2021-01-05 ENCOUNTER — Encounter (HOSPITAL_BASED_OUTPATIENT_CLINIC_OR_DEPARTMENT_OTHER): Payer: Self-pay | Admitting: Physical Therapy

## 2021-01-05 ENCOUNTER — Encounter: Payer: Self-pay | Admitting: Family Medicine

## 2021-01-05 ENCOUNTER — Ambulatory Visit (HOSPITAL_BASED_OUTPATIENT_CLINIC_OR_DEPARTMENT_OTHER): Payer: Medicaid Other | Attending: Physician Assistant | Admitting: Physical Therapy

## 2021-01-05 DIAGNOSIS — R293 Abnormal posture: Secondary | ICD-10-CM | POA: Insufficient documentation

## 2021-01-05 DIAGNOSIS — M542 Cervicalgia: Secondary | ICD-10-CM | POA: Diagnosis present

## 2021-01-05 DIAGNOSIS — M546 Pain in thoracic spine: Secondary | ICD-10-CM | POA: Insufficient documentation

## 2021-01-05 NOTE — Therapy (Signed)
OUTPATIENT PHYSICAL THERAPY TREATMENT NOTE   Patient Name: Mary Cochran MRN: [id number] DOB:1995/08/26, 25 y.o., female Today's Date: 01/05/2021  PCP: [name], PA REFERRING PROVIDER: [name], PA    Past Medical History:  Diagnosis Date   ALLERGIC RHINITIS 12/12/2006   Allergy    SEASONAL   Anxiety    Depression 10/03/2011   GILBERT'S SYNDROME 03/07/2010   Headache    Migraines   IBS (irritable bowel syndrome) 10/04/2015   INSOMNIA-SLEEP DISORDER-UNSPEC 04/18/2007   Tubular adenoma of colon 10/2014   Past Surgical History:  Procedure Laterality Date   COLONOSCOPY  2016   MOUTH SURGERY     TONSILLECTOMY     Patient Active Problem List   Diagnosis Date Noted   Nonallopathic lesion of sacral region 10/26/2015   Nonallopathic lesion of thoracic region 10/26/2015   Nonallopathic lesion of lumbosacral region 10/26/2015   Scapular dysfunction 10/26/2015   IBS (irritable bowel syndrome) 10/04/2015   Adenomatous polyp of colon 10/04/2015   Polyarthralgia 10/04/2015   Eczema of both hands 09/30/2013   Skin lesion 10/16/2012   Vertigo 04/06/2012   Migraine with aura 04/06/2012   Anxiety 10/30/2011   Depression 10/03/2011   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   SYNCOPE 10/07/2008   INSOMNIA-SLEEP DISORDER-UNSPEC 04/18/2007   ALLERGIC RHINITIS 12/12/2006     REFERRING PROVIDER: [name], PA   REFERRING DIAG: [phone] (ICD-10-CM) - Chronic midline back pain, unspecified back location    THERAPY DIAG:  Cervicalgia   Pain in thoracic spine   Abnormal posture       ONSET DATE: Exacerbation 3 years ago; 12/02/2020 MD visit/script   SUBJECTIVE:                                                                                                                                                                                                          SUBJECTIVE STATEMENT: -Patient has a fractured rib. That has been her main source of pain  since she did it. She has a binder which may help a little. Her neck has been in its normal level of soreness and pain.   PERTINENT HISTORY:  Chronic pain.  Hx of a lumbar bulging disc.  Migraines with aura, syncope, and vertigo.     PAIN:  Are you having pain? Yes NPRS scale: 7-8  Pain location: ribs  PAIN TYPE: aching  Pain description: constant  Aggravating factors: any movements of the ribs  Relieving factors: binder    PRECAUTIONS: None      PLOF: Independent; Pt has a hx  of chronic pain though was able to perform her daily activities with less pain prior to this exacerbation 3 years ago.     PATIENT GOALS to sleep better.  Improved pain, "not be so miserable"   OBJECTIVE:                POSTURE:  FHP and rounded shoulders     PALPATION:  TTP:  bilat UT, cervical and thoracic paraspinals. Pt had moderate soft tissue tightness in bilat UT.       TODAY'S TREATMENT:  01/05/2021 Manual therapy: sub-occipital release and gentle traction; trigger point release to upper traps and peri-scapular area in supine with knees supported; reviewed; reviewed use of thera-cane areas of referred pain how and where to purchase and use  TPDN: to upper trap: consent and education given: left upper trap 2x right 1x using a .30x50 needle; skilled palpation and monitoring during needling. Good twitch and palpable improvement in muscle length   Reviewed scpa retraction and chin tuck for home    11/21  Therapeutic Exercise:  -Reviewed response to prior Rx, HEP compliance, and current function.   -Reviewed and performed HEP. -Pt performed -chin tucks 2x10 reps -scap retraction 2x10reps with 3 sec hold.  -rows with retraction with RTB 2x10 -standing shoulder extension with retraction with RTB 2x10 -standing shoulder horizontal abduction with YTB 2x10 reps with cuing to not hyperextend elbows     Manual Therapy:              -Pt received STM with MFR to bilat UT and IASTM to bilat UT to  improve pain and soft tissue tightness and to reduce myofascial restrictions and adhesions.   PATIENT EDUCATION:  Education details: rationale of Rx, exercise form, and POC.  Correct posture.  Reviewed and performed HEP.  Answered Pt's questions.   Person educated: Patient Education method: Explanation, Demonstration, Tactile cues, and Verbal cues Education comprehension: verbalized understanding, returned demonstration, verbal cues required, and tactile cues required     HOME EXERCISE PROGRAM: Access Code: [email or web address] URL: [email or web address] Date: 12/20/2020 Prepared by: Ronny Flurry   Exercises Seated Scapular Retraction - 2 x daily - 7 x weekly - 1-2 sets - 10 reps Seated Cervical Retraction - 2 x daily - 7 x weekly - 2 sets - 10 reps     ASSESSMENT:   CLINICAL IMPRESSION: Patient tolerated treatment well. She had a great twitch response on the right and a mild twitch on the left. She was able to tolerate supine positioning with her ribs. She     Objective impairments include weakness, decreased activity tolerance, increased muscle spasms, impaired flexibility, postural dysfunction, and pain. These impairments are limiting patient from cleaning and taking care of children . Personal factors including Time since onset of injury/illness/exacerbation and 1 comorbidity: migraines with aura  are also affecting patient's functional outcome.        GOALS:     SHORT TERM GOALS:   STG Name Target Date Goal status  1 Pt will be independent and compliant with HEP for improved pain, posture, tolerance to activity, and function.  Baseline:  01/03/2021 INITIAL  2 Pt will report at least a 25% improvement in pain and sx's overall Baseline:  01/10/2021 INITIAL  3 Pt will demo improved posture while in the clinic and report she is able to maintain improved posture outside of the clinic with reduced pain.   Baseline: 01/10/2021 INITIAL  4 Pt will demo improved tightness in  bilat UT for reduced  tension and pain with her daily activities and household chores. Baseline: 01/18/2021 INITIAL    LONG TERM GOALS:    LTG Name Target Date Goal status  1 Pt will be able to sleep at least 4-5 nights per week without pain waking her up.  Baseline: 01/31/2021 INITIAL  2 Pt will report at least a 70% improvement in pain and sx's overall Baseline: 01/31/2021 INITIAL  3 Pt will report she is able to carry her children and take care of them without significant pain or difficulty.  Baseline: 01/31/2021 INITIAL  4 Pt will be able to perform her household chores and IADLs without significant pain or difficulty.  Baseline: 01/31/2021 INITIAL                               PLAN: PT FREQUENCY: 2x/week   PT DURATION: 6 weeks   PLANNED INTERVENTIONS: Therapeutic exercises, Therapeutic activity, Neuro Muscular re-education, Patient/Family education, Joint mobilization, Aquatic Therapy, Dry Needling, Electrical stimulation, Spinal mobilization, Cryotherapy, Moist heat, Taping, Ultrasound, and Manual therapy   PLAN FOR NEXT SESSION: Manual techniques including STM and IASTM.  Postural/scap stab's.  Review and perform HEP.   Progress HEP next visit if pt tolerates exercises well.  Consider dry needling   Carney Living PT DPT  01/05/2021, 8:45 AM

## 2021-01-05 NOTE — Telephone Encounter (Signed)
Spoke to patient. Appointment for Bone Density has been scheduled.

## 2021-01-06 ENCOUNTER — Ambulatory Visit (INDEPENDENT_AMBULATORY_CARE_PROVIDER_SITE_OTHER)
Admission: RE | Admit: 2021-01-06 | Discharge: 2021-01-06 | Disposition: A | Discharge status: Home / Self Care | Payer: Medicaid Other | Source: Ambulatory Visit | Attending: Physician Assistant | Admitting: Physician Assistant

## 2021-01-06 DIAGNOSIS — S2232XA Fracture of one rib, left side, initial encounter for closed fracture: Secondary | ICD-10-CM

## 2021-01-09 ENCOUNTER — Encounter: Payer: Self-pay | Admitting: Family Medicine

## 2021-01-09 NOTE — Progress Notes (Signed)
Bone mineral test is very slightly low but still in the normal range.  Recommend calcium and vitamin D and good weightbearing exercise in the future.

## 2021-01-10 ENCOUNTER — Encounter: Payer: Self-pay | Admitting: Physician Assistant

## 2021-01-13 DIAGNOSIS — R509 Fever, unspecified: Secondary | ICD-10-CM | POA: Diagnosis not present

## 2021-01-13 DIAGNOSIS — J029 Acute pharyngitis, unspecified: Secondary | ICD-10-CM | POA: Diagnosis not present

## 2021-01-19 ENCOUNTER — Ambulatory Visit (INDEPENDENT_AMBULATORY_CARE_PROVIDER_SITE_OTHER): Payer: Medicaid Other | Admitting: Family Medicine

## 2021-01-19 ENCOUNTER — Other Ambulatory Visit: Payer: Self-pay

## 2021-01-19 ENCOUNTER — Ambulatory Visit (INDEPENDENT_AMBULATORY_CARE_PROVIDER_SITE_OTHER): Payer: Medicaid Other

## 2021-01-19 VITALS — BP 100/74 | HR 81 | Ht 66.0 in | Wt 115.4 lb

## 2021-01-19 DIAGNOSIS — S2242XD Multiple fractures of ribs, left side, subsequent encounter for fracture with routine healing: Secondary | ICD-10-CM

## 2021-01-19 DIAGNOSIS — R0781 Pleurodynia: Secondary | ICD-10-CM

## 2021-01-19 MED ORDER — BENZONATATE 200 MG PO CAPS: 200.0000 mg | ORAL_CAPSULE | Freq: Three times a day (TID) | ORAL | 0 refills | Status: DC | PRN

## 2021-01-19 NOTE — Patient Instructions (Addendum)
Thank you for coming in today.   Recheck back as needed.   Use tessalon pearles for cough.   Let us know if you need opiate based pain medicine or cough medicine.

## 2021-01-19 NOTE — Progress Notes (Signed)
I, Peterson Lombard, LAT, ATC acting as a scribe for Lynne Leader, MD.  CAHTERINE Cochran is a 25 y.o. female who presents to [place] at [facility] today for continued rib pain that developed after an illness w/ coughing. Pt was last seen by Dr. Georgina Snell on 01/03/21 and was advised to use a rib binder and use OTC pain meds. After exchanging multiple MyChart messages after her initial visit, pt was also advised to use a pillow clamped against her side when coughing. Additionally, a bone density test was ordered, revealing slightly low levels, and pt was advised to supplement w/ calcium, vitamin D, and weightbearing exercise. Pt was seen at the [place] [facility] on 01/13/21 presenting w/ sore throat and fever and was dx w/ pharyngitis and prescribed amoxicillin. Today, pt reports after getting sick again, she is experiencing pain on the R side and the initial L side has worsened. Pt is taking an OTC cough suppressant. Pt c/o L side of her rib is sticking out worse and there now appears to be a similar bump/swelling over the R side.  Dx testing: 01/06/21 Bone density scan --normal  01/03/21 L-rib XR  Pertinent review of systems: No fevers or chills  Relevant historical information: Normal bone density   Exam:  BP 100/74    Pulse 81    Ht 5\' 6"  (1.676 m)    Wt 115 lb 6.4 oz (52.3 kg)    LMP 12/21/2020 (Exact Date)    SpO2 98%    BMI 18.63 kg/m  General: Well Developed, well nourished, and in no acute distress.   MSK: Right inferior ribs tender to palpation Left inferior ribs tender to palpation.  Lungs expand normally bilaterally.    Lab and Radiology Results  X-ray bilateral ribs with chest obtained today personally inability interpreted. Left ribs 2 inferior lateral rib fractures nondisplaced 8 and 9 Right no visible rib fractures present. No pneumothorax or pleural effusion present. Await formal radiology review     Assessment and Plan: 25 y.o. female with new left  lower lateral rib fractures.  No right visible rib fractures present. Plan for conservative management.  Plan for cough suppression with Tessalon Perles.  Offered opiates patient declined for now.  Certainly this would be a reasonable option in the future.  I informed patient that I will be out of the office tomorrow Monday and Tuesday and that cannot guarantee that my colleagues will be willing to prescribe opiates if she needs them for cough suppression or pain.. Discussed conservative management strategies for rib fracture.  Recheck back as needed.   PDMP reviewed during this encounter. Orders Placed This Encounter  Procedures   DG Ribs Bilateral W/Chest    Standing Status:   Future    Number of Occurrences:   1    Standing Expiration Date:   01/19/2022    Order Specific Question:   Reason for Exam (SYMPTOM  OR DIAGNOSIS REQUIRED)    Answer:   rib pain    Order Specific Question:   Is patient pregnant?    Answer:   No    Order Specific Question:   Preferred imaging location?    Answer:   [facility]   Meds ordered this encounter  Medications   benzonatate (TESSALON) 200 MG capsule    Sig: Take 1 capsule (200 mg total) by mouth 3 (three) times daily as needed for cough.    Dispense:  45 capsule    Refill:  0     Discussed warning signs or symptoms. Please see discharge instructions. Patient expresses understanding.   The above documentation has been reviewed and is accurate and complete Lynne Leader, M.D.

## 2021-01-25 NOTE — Progress Notes (Signed)
Radiology did not see any rib fractures.

## 2021-02-21 ENCOUNTER — Other Ambulatory Visit: Payer: Self-pay

## 2021-02-21 ENCOUNTER — Ambulatory Visit (HOSPITAL_BASED_OUTPATIENT_CLINIC_OR_DEPARTMENT_OTHER): Payer: Medicaid Other | Attending: Physician Assistant | Admitting: Physical Therapy

## 2021-02-21 DIAGNOSIS — R293 Abnormal posture: Secondary | ICD-10-CM | POA: Insufficient documentation

## 2021-02-21 DIAGNOSIS — M542 Cervicalgia: Secondary | ICD-10-CM | POA: Diagnosis present

## 2021-02-21 DIAGNOSIS — M546 Pain in thoracic spine: Secondary | ICD-10-CM | POA: Insufficient documentation

## 2021-02-21 NOTE — Therapy (Addendum)
OUTPATIENT PHYSICAL THERAPY TREATMENT NOTE   Patient Name: Mary Cochran MRN: [id number] DOB:May 19, 1995, 26 y.o., female Today's Date: 02/22/2021  PCP: [name], [place] REFERRING PROVIDER: [name], PA   PT End of Session - 02/21/21 1115     Visit Number 4    Number of Visits 16    Date for PT Re-Evaluation 04/04/21    Authorization Type [employer] MCD    PT Start Time 1026    PT Stop Time 1101    PT Time Calculation (min) 35 min    Activity Tolerance Patient tolerated treatment well    Behavior During Therapy [facility] for tasks assessed/performed             Past Medical History:  Diagnosis Date   ALLERGIC RHINITIS 12/12/2006   Allergy    SEASONAL   Anxiety    Depression 10/03/2011   GILBERT'S SYNDROME 03/07/2010   Headache    Migraines   IBS (irritable bowel syndrome) 10/04/2015   INSOMNIA-SLEEP DISORDER-UNSPEC 04/18/2007   Tubular adenoma of colon 10/2014   Past Surgical History:  Procedure Laterality Date   COLONOSCOPY  2016   MOUTH SURGERY     TONSILLECTOMY     Patient Active Problem List   Diagnosis Date Noted   Nonallopathic lesion of sacral region 10/26/2015   Nonallopathic lesion of thoracic region 10/26/2015   Nonallopathic lesion of lumbosacral region 10/26/2015   Scapular dysfunction 10/26/2015   IBS (irritable bowel syndrome) 10/04/2015   Adenomatous polyp of colon 10/04/2015   Polyarthralgia 10/04/2015   Eczema of both hands 09/30/2013   Skin lesion 10/16/2012   Vertigo 04/06/2012   Migraine with aura 04/06/2012   Anxiety 10/30/2011   Depression 10/03/2011   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   SYNCOPE 10/07/2008   INSOMNIA-SLEEP DISORDER-UNSPEC 04/18/2007   ALLERGIC RHINITIS 12/12/2006     REFERRING PROVIDER: [name], PA   REFERRING DIAG: [phone] (ICD-10-CM) - Chronic midline back pain, unspecified back location    THERAPY DIAG:  Cervicalgia   Pain in thoracic spine   Abnormal posture       ONSET  DATE: Exacerbation 3 years ago; 12/02/2020 MD visit/script   SUBJECTIVE:                                                                                                                                                                                                          SUBJECTIVE STATEMENT:      -Pt had the flu during Thanksgiving and had fractured ribs due to coughing.  Pt had x rays on  01/03/21 2 inferior lateral L sided rib fractures nondisplaced 8 and 9 and no visible rib fractures on R.  Pt became sick again with a cold and had more coughing experiencing R sided rib pain.  Pt had x rays and showed no fracture on R and L sided though not sure if re-injured L sided ribs.  Pt has been absent from PT due to rib pain/fractures.  She states she is completely fine now not having any rib pain.   -Pt had a bone density scan on 01/06/21 which was normal but revealed slightly low levels.  MD note indicated pt was advised to supplement w/ calcium, vitamin D, and weightbearing exercise   Pt states she felt a lot better after prior Rx with dry needling.  Pt didn't notice as much neck pain during rib injury but now her pain has returned to her baseline level.  Pt reports 10-15% improvement in pain and sx's overall.  Pt is working on posture.  PPt has been performing HEP occasionally and yoga.   Pt c/o's of knots in UT.  Pt reports minimal improvement in household chores and IADLs.    PERTINENT HISTORY:  Chronic pain.  Hx of a lumbar bulging disc.  Migraines with aura, syncope, and vertigo.     PAIN:  Are you having pain? Yes NPRS scale: 5-6/10 current, 8/10 worst, 4/10 best Pain location: cervical, R UT >L, and thoracic PAIN TYPE: aching and tense Pain description: constant  Aggravating factors: stress, lifting children Relieving factors: lidocaine roll on which helps more than CBD oil.  Warm bath. Heat pad.  Chiropractor.  Massage. Theragun.  (All short term relief)   PRECAUTIONS: None      PLOF:  Independent; Pt has a hx of chronic pain though was able to perform her daily activities with less pain prior to this exacerbation 3 years ago.     PATIENT GOALS to sleep better.  Improved pain, to feel better   OBJECTIVE:                POSTURE:  FHP and rounded shoulders            CERVICAL AROM/PROM   A/PROM A/PROM (deg) 12/20/2020  Flexion WFL  Extension 54  Right lateral flexion 36  Left lateral flexion 36 "tightness"  Right rotation WNL  Left rotation WNL   (Blank rows = not tested)   PALPATION:  TTP:  bilat UT, cervical and thoracic paraspinals. Pt had moderate soft tissue tightness in bilat UT.         PATIENT SURVEYS:  FOTO 60      Therapeutic Exercise:  -Reviewed response to prior Rx, HEP compliance, and current function. -Assessed cervical AROM and palpation -Educated pt concerning POC and objective findings         Manual Therapy:              -Pt received STM R UT in sitting to improve pain and soft tissue tightness and to reduce myofascial restrictions and adhesions.  PATIENT EDUCATION:  Education details: POC and objective findings.     Person educated: Patient Education method: Explanation, Demonstration Education comprehension: verbalized understanding, returned demonstration     HOME EXERCISE PROGRAM: Access Code: [email or web address] URL: [email or web address] Date: 12/20/2020 Prepared by: Ronny Flurry   Exercises Seated Scapular Retraction - 2 x daily - 7 x weekly - 1-2 sets - 10 reps Seated Cervical Retraction - 2 x daily - 7 x weekly - 2 sets - 10  reps   PATIENT EDUCATION:  Education details: rationale of Rx, exercise form, and POC.  Correct posture.  Reviewed and performed HEP.  Answered Pt's questions.   Person educated: Patient Education method: Explanation, Demonstration, Tactile cues, and Verbal cues Education comprehension: verbalized understanding, returned demonstration, verbal cues required, and tactile cues required      HOME EXERCISE PROGRAM: Access Code: [email or web address] URL: [email or web address] Date: 12/20/2020 Prepared by: Ronny Flurry   Exercises Seated Scapular Retraction - 2 x daily - 7 x weekly - 1-2 sets - 10 reps Seated Cervical Retraction - 2 x daily - 7 x weekly - 2 sets - 10 reps     ASSESSMENT:   CLINICAL IMPRESSION: Pt has been absent from PT due to rib pain/fractures.  She states she is completely fine now not having any rib pain.  Pt was improving prior with PT including having a good response to dry needling.  Pt has been limited with exercises and activity due to rib fractures.  Pt demonstrates improved cervical AROM.  Pt has been more aware of her posture and is working on her posture.  Pt has difficulty sleeping and continues to have H/A. Pt continues to have pain taking care of children.  Pt has limited progress toward goals but has had limited PT visits due to rib fractures.  She should benefit from cont skilled PT services to address impairments and goals and to assist in restoring PLOF.      Objective impairments include weakness, decreased activity tolerance, increased muscle spasms, impaired flexibility, postural dysfunction, and pain. These impairments are limiting patient from cleaning and taking care of children . Personal factors including Time since onset of injury/illness/exacerbation and 1 comorbidity: migraines with aura  are also affecting patient's functional outcome.        GOALS:     SHORT TERM GOALS:   STG Name Target Date Goal status  1 Pt will be independent and compliant with HEP for improved pain, posture, tolerance to activity, and function.  Baseline:  01/03/2021 PARTIALLY MET  2 Pt will report at least a 25% improvement in pain and sx's overall Baseline:  01/10/2021 ONGOING  3 Pt will demo improved posture while in the clinic and report she is able to maintain improved posture outside of the clinic with reduced pain.   Baseline: 01/10/2021  PARTIALLY MET  4 Pt will demo improved tightness in bilat UT for reduced tension and pain with her daily activities and household chores. Baseline: 01/18/2021 ongoing    LONG TERM GOALS:    LTG Name Target Date Goal status  1 Pt will be able to sleep at least 4-5 nights per week without pain waking her up.  Baseline: 04/04/2021 NOT MET  2 Pt will report at least a 70% improvement in pain and sx's overall Baseline: 04/04/2021 ONGOING  3 Pt will report she is able to carry her children and take care of them without significant pain or difficulty.  Baseline: 04/04/2021 NOT MET  4 Pt will be able to perform her household chores and IADLs without significant pain or difficulty.  Baseline: 04/04/2021 PARTIALLY MET                               PLAN: PT FREQUENCY: 2x/week   PT DURATION: 6 weeks   PLANNED INTERVENTIONS: Therapeutic exercises, Therapeutic activity, Neuro Muscular re-education, Patient/Family education, Joint mobilization, Aquatic Therapy, Dry Needling, Electrical stimulation, Spinal mobilization, Cryotherapy, Moist  heat, Taping, Ultrasound, and Manual therapy   PLAN FOR NEXT SESSION: Continue with Manual techniques including STM and IASTM and dry needling.  Postural/scap stab's per pt tolerance with consideration of recent rib fractures.  Review and perform HEP.      Selinda Michaels III PT, DPT 02/22/21 7:21 AM

## 2021-02-22 ENCOUNTER — Encounter (HOSPITAL_BASED_OUTPATIENT_CLINIC_OR_DEPARTMENT_OTHER): Payer: Self-pay | Admitting: Physical Therapy

## 2021-03-03 ENCOUNTER — Other Ambulatory Visit: Payer: Self-pay | Admitting: Physician Assistant

## 2021-03-04 ENCOUNTER — Encounter: Payer: Self-pay | Admitting: Physician Assistant

## 2021-03-06 ENCOUNTER — Other Ambulatory Visit: Payer: Self-pay

## 2021-03-06 ENCOUNTER — Ambulatory Visit (HOSPITAL_BASED_OUTPATIENT_CLINIC_OR_DEPARTMENT_OTHER): Payer: Medicaid Other | Admitting: Physical Therapy

## 2021-03-06 ENCOUNTER — Telehealth: Payer: Self-pay | Admitting: Physician Assistant

## 2021-03-06 ENCOUNTER — Encounter (HOSPITAL_BASED_OUTPATIENT_CLINIC_OR_DEPARTMENT_OTHER): Payer: Self-pay | Admitting: Physical Therapy

## 2021-03-06 ENCOUNTER — Other Ambulatory Visit: Payer: Self-pay | Admitting: Physician Assistant

## 2021-03-06 DIAGNOSIS — M542 Cervicalgia: Secondary | ICD-10-CM | POA: Diagnosis not present

## 2021-03-06 DIAGNOSIS — M546 Pain in thoracic spine: Secondary | ICD-10-CM

## 2021-03-06 DIAGNOSIS — R293 Abnormal posture: Secondary | ICD-10-CM

## 2021-03-06 MED ORDER — LISDEXAMFETAMINE DIMESYLATE 20 MG PO CAPS: 20.0000 mg | ORAL_CAPSULE | Freq: Every day | ORAL | 0 refills | Status: DC

## 2021-03-06 NOTE — Telephone Encounter (Signed)
Pt states she needs a refill on her medication and would like to switch to [id number] on [address], [place]. She is out and [facility] is no longer her pharmacy.  lisdexamfetamine (VYVANSE) 20 MG capsule

## 2021-03-06 NOTE — Therapy (Signed)
OUTPATIENT PHYSICAL THERAPY TREATMENT NOTE   Patient Name: Mary Cochran MRN: [id number] DOB:07/06/1995, 26 y.o., female Today's Date: 03/06/2021  PCP: [name], [place] REFERRING PROVIDER: [name], PA   PT End of Session - 03/06/21 1355     Visit Number 5    Number of Visits 16    Date for PT Re-Evaluation 04/04/21    Authorization Type [employer] MCD    PT Start Time 6226    PT Stop Time 3335    PT Time Calculation (min) 40 min    Activity Tolerance Patient tolerated treatment well    Behavior During Therapy [facility] for tasks assessed/performed              Past Medical History:  Diagnosis Date   ALLERGIC RHINITIS 12/12/2006   Allergy    SEASONAL   Anxiety    Depression 10/03/2011   GILBERT'S SYNDROME 03/07/2010   Headache    Migraines   IBS (irritable bowel syndrome) 10/04/2015   INSOMNIA-SLEEP DISORDER-UNSPEC 04/18/2007   Tubular adenoma of colon 10/2014   Past Surgical History:  Procedure Laterality Date   COLONOSCOPY  2016   MOUTH SURGERY     TONSILLECTOMY     Patient Active Problem List   Diagnosis Date Noted   Nonallopathic lesion of sacral region 10/26/2015   Nonallopathic lesion of thoracic region 10/26/2015   Nonallopathic lesion of lumbosacral region 10/26/2015   Scapular dysfunction 10/26/2015   IBS (irritable bowel syndrome) 10/04/2015   Adenomatous polyp of colon 10/04/2015   Polyarthralgia 10/04/2015   Eczema of both hands 09/30/2013   Skin lesion 10/16/2012   Vertigo 04/06/2012   Migraine with aura 04/06/2012   Anxiety 10/30/2011   Depression 10/03/2011   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   SYNCOPE 10/07/2008   INSOMNIA-SLEEP DISORDER-UNSPEC 04/18/2007   ALLERGIC RHINITIS 12/12/2006     REFERRING PROVIDER: [name], PA   REFERRING DIAG: [phone] (ICD-10-CM) - Chronic midline back pain, unspecified back location    THERAPY DIAG:  Cervicalgia   Pain in thoracic spine   Abnormal posture       ONSET  DATE: Exacerbation 3 years ago; 12/02/2020 MD visit/script   SUBJECTIVE:                                                                                                                                                                                                          SUBJECTIVE STATEMENT:      -Pt had the flu during Thanksgiving and had fractured ribs due to coughing.  Pt had x rays  on 01/03/21 2 inferior lateral L sided rib fractures nondisplaced 8 and 9 and no visible rib fractures on R.  Pt became sick again with a cold and had more coughing experiencing R sided rib pain.  Pt had x rays and showed no fracture on R and L sided though not sure if re-injured L sided ribs.  Pt has been absent from PT due to rib pain/fractures.  She states she is completely fine now not having any rib pain.   -Pt had a bone density scan on 01/06/21 which was normal but revealed slightly low levels.  MD note indicated pt was advised to supplement w/ calcium, vitamin D, and weightbearing exercise   Pt states she continues to have R sided neck pain today. She has a HA today bc of the pain. She states that her low back will "freeze up."     PERTINENT HISTORY:  Chronic pain.  Hx of a lumbar bulging disc.  Migraines with aura, syncope, and vertigo.     PAIN:  Are you having pain? Yes NPRS scale: 6/10 current, 8/10 worst, 4/10 best Pain location: cervical, R UT >L, and thoracic PAIN TYPE: aching and tense Pain description: constant  Aggravating factors: stress, lifting children Relieving factors: lidocaine roll on which helps more than CBD oil.  Warm bath. Heat pad.  Chiropractor.  Massage. Theragun.  (All short term relief)   PRECAUTIONS: None      PLOF: Independent; Pt has a hx of chronic pain though was able to perform her daily activities with less pain prior to this exacerbation 3 years ago.     PATIENT GOALS to sleep better.  Improved pain, to feel better   OBJECTIVE:   Beighton Hicks scale: 6/9                PATIENT SURVEYS:  FOTO 60   Therapeutic Exercise:   Chin tuck 2s 10x YTB ER 2x10 GTB standing shrug 2x10 Scap shoulder depression 2s 2x15     Manual Therapy: STM R UT and subocciptals; C2-6 CPA and UPA grade III in supine,    TPDN R UT 30x30; skilled palpation and assessment of tissue response throughout; LTR elicited strongly on R            PATIENT EDUCATION:  Education details: TPDN edu with verbal consent given, anatomy, exercise progression, DOMS expectations, muscle firing,  envelope of function, HEP, POC      Person educated: Patient Education method: Explanation, Demonstration Education comprehension: verbalized understanding, returned demonstration     HOME EXERCISE PROGRAM: Access Code: [email or web address] URL: [email or web address] Date: 12/20/2020 Prepared by: Ronny Flurry   Exercises Seated Scapular Retraction - 2 x daily - 7 x weekly - 1-2 sets - 10 reps Seated Cervical Retraction - 2 x daily - 7 x weekly - 2 sets - 10 reps   PATIENT EDUCATION:  Education details: rationale of Rx, exercise form, and POC.  Correct posture.  Reviewed and performed HEP.  Answered Pt's questions.   Person educated: Patient Education method: Explanation, Demonstration, Tactile cues, and Verbal cues Education comprehension: verbalized understanding, returned demonstration, verbal cues required, and tactile cues required     HOME EXERCISE PROGRAM: Access Code: [email or web address] URL: [email or web address] Date: 12/20/2020 Prepared by: Ronny Flurry   Exercises Seated Scapular Retraction - 2 x daily - 7 x weekly - 1-2 sets - 10 reps Seated Cervical Retraction - 2 x daily - 7 x weekly - 2 sets - 10 reps     ASSESSMENT:  CLINICAL IMPRESSION:  Pt with good response to DN at today's session with reduction of pain to 4/10 following TPDN and manual therapy. Pt does score as more hypermobile on the Beighton Hicks scale suggesting pt lacks stability vs  flexibility. Pt without pain during resistance based exercise. Plan to assess for response to new HEP at next session. Pt cleared at this time from rib fracture. No discomfort through session. Pt will benefit from cont skilled PT services to address impairments and goals and to assist in restoring PLOF.      Objective impairments include weakness, decreased activity tolerance, increased muscle spasms, impaired flexibility, postural dysfunction, and pain. These impairments are limiting patient from cleaning and taking care of children . Personal factors including Time since onset of injury/illness/exacerbation and 1 comorbidity: migraines with aura  are also affecting patient's functional outcome.        GOALS:     SHORT TERM GOALS:   STG Name Target Date Goal status  1 Pt will be independent and compliant with HEP for improved pain, posture, tolerance to activity, and function.  Baseline:  01/03/2021 PARTIALLY MET  2 Pt will report at least a 25% improvement in pain and sx's overall Baseline:  01/10/2021 ONGOING  3 Pt will demo improved posture while in the clinic and report she is able to maintain improved posture outside of the clinic with reduced pain.   Baseline: 01/10/2021 PARTIALLY MET  4 Pt will demo improved tightness in bilat UT for reduced tension and pain with her daily activities and household chores. Baseline: 01/18/2021 ongoing    LONG TERM GOALS:    LTG Name Target Date Goal status  1 Pt will be able to sleep at least 4-5 nights per week without pain waking her up.  Baseline: 04/04/2021 NOT MET  2 Pt will report at least a 70% improvement in pain and sx's overall Baseline: 04/04/2021 ONGOING  3 Pt will report she is able to carry her children and take care of them without significant pain or difficulty.  Baseline: 04/04/2021 NOT MET  4 Pt will be able to perform her household chores and IADLs without significant pain or difficulty.  Baseline: 04/04/2021 PARTIALLY MET                                PLAN: PT FREQUENCY: 2x/week   PT DURATION: 6 weeks   PLANNED INTERVENTIONS: Therapeutic exercises, Therapeutic activity, Neuro Muscular re-education, Patient/Family education, Joint mobilization, Aquatic Therapy, Dry Needling, Electrical stimulation, Spinal mobilization, Cryotherapy, Moist heat, Taping, Ultrasound, and Manual therapy   PLAN FOR NEXT SESSION: Continue with Manual techniques including STM and IASTM and dry needling.  Postural/scap stab's per pt tolerance with consideration of recent rib fractures.  Review and perform HEP.      Daleen Bo PT, DPT 03/06/21 1:58 PM

## 2021-03-06 NOTE — Telephone Encounter (Signed)
Please resend Vyvanse. Pt changed pharmacy. Updated pharmacy.

## 2021-03-07 NOTE — Telephone Encounter (Signed)
Left detailed message on voicemail Rx was sent to pharmacy as requested. Any questions contact the office.

## 2021-03-12 DIAGNOSIS — Z20822 Contact with and (suspected) exposure to covid-19: Secondary | ICD-10-CM | POA: Diagnosis not present

## 2021-03-12 DIAGNOSIS — J029 Acute pharyngitis, unspecified: Secondary | ICD-10-CM | POA: Diagnosis not present

## 2021-03-14 ENCOUNTER — Ambulatory Visit (HOSPITAL_BASED_OUTPATIENT_CLINIC_OR_DEPARTMENT_OTHER): Payer: Medicaid Other | Attending: Physician Assistant | Admitting: Physical Therapy

## 2021-03-14 ENCOUNTER — Other Ambulatory Visit: Payer: Self-pay

## 2021-03-14 ENCOUNTER — Encounter (HOSPITAL_BASED_OUTPATIENT_CLINIC_OR_DEPARTMENT_OTHER): Payer: Self-pay | Admitting: Physical Therapy

## 2021-03-14 DIAGNOSIS — M542 Cervicalgia: Secondary | ICD-10-CM | POA: Insufficient documentation

## 2021-03-14 DIAGNOSIS — M546 Pain in thoracic spine: Secondary | ICD-10-CM | POA: Insufficient documentation

## 2021-03-14 DIAGNOSIS — R293 Abnormal posture: Secondary | ICD-10-CM | POA: Diagnosis present

## 2021-03-14 NOTE — Therapy (Signed)
OUTPATIENT PHYSICAL THERAPY TREATMENT NOTE   Patient Name: Mary Cochran MRN: [id number] DOB:04/10/1995, 26 y.o., female Today's Date: 03/14/2021  PCP: [name], [place] REFERRING PROVIDER: [name], PA   PT End of Session - 03/14/21 1351     Visit Number 6    Number of Visits 16    Date for PT Re-Evaluation 04/04/21    Authorization Type [employer] MCD    PT Start Time 7673    PT Stop Time 4193    PT Time Calculation (min) 38 min    Activity Tolerance Patient tolerated treatment well    Behavior During Therapy [facility] for tasks assessed/performed               Past Medical History:  Diagnosis Date   ALLERGIC RHINITIS 12/12/2006   Allergy    SEASONAL   Anxiety    Depression 10/03/2011   GILBERT'S SYNDROME 03/07/2010   Headache    Migraines   IBS (irritable bowel syndrome) 10/04/2015   INSOMNIA-SLEEP DISORDER-UNSPEC 04/18/2007   Tubular adenoma of colon 10/2014   Past Surgical History:  Procedure Laterality Date   COLONOSCOPY  2016   MOUTH SURGERY     TONSILLECTOMY     Patient Active Problem List   Diagnosis Date Noted   Nonallopathic lesion of sacral region 10/26/2015   Nonallopathic lesion of thoracic region 10/26/2015   Nonallopathic lesion of lumbosacral region 10/26/2015   Scapular dysfunction 10/26/2015   IBS (irritable bowel syndrome) 10/04/2015   Adenomatous polyp of colon 10/04/2015   Polyarthralgia 10/04/2015   Eczema of both hands 09/30/2013   Skin lesion 10/16/2012   Vertigo 04/06/2012   Migraine with aura 04/06/2012   Anxiety 10/30/2011   Depression 10/03/2011   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   SYNCOPE 10/07/2008   INSOMNIA-SLEEP DISORDER-UNSPEC 04/18/2007   ALLERGIC RHINITIS 12/12/2006     REFERRING PROVIDER: [name], PA   REFERRING DIAG: [phone] (ICD-10-CM) - Chronic midline back pain, unspecified back location    THERAPY DIAG:  Cervicalgia   Pain in thoracic spine   Abnormal posture       ONSET  DATE: Exacerbation 3 years ago; 12/02/2020 MD visit/script   SUBJECTIVE:                                                                                                                                                                                                          SUBJECTIVE STATEMENT:      -Pt had the flu during Thanksgiving and had fractured ribs due to coughing.  Pt had x  rays on 01/03/21 2 inferior lateral L sided rib fractures nondisplaced 8 and 9 and no visible rib fractures on R.  Pt became sick again with a cold and had more coughing experiencing R sided rib pain.  Pt had x rays and showed no fracture on R and L sided though not sure if re-injured L sided ribs.  She states she is completely fine now not having any rib pain.   -Pt had a bone density scan on 01/06/21 which was normal but revealed slightly low levels.  MD note indicated pt was advised to supplement w/ calcium, vitamin D, and weightbearing exercise    -Pt states she has a cold though tested negative for Covid.  Pt states she felt much better after prior Rx, the dry needling helped.  Pt reports reduced trigger point.  Pt states her back has been bothering her.  Pt also uses the theracane at home which helped.   PERTINENT HISTORY:  Chronic pain.  Hx of a lumbar bulging disc.  Migraines with aura, syncope, and vertigo.     PAIN:  Are you having pain? Yes NPRS scale: 1-2/10 current, 8/10 worst, 4/10 best Pain location: cervical, R UT >L, and thoracic PAIN TYPE: aching and tense Pain description: constant  Aggravating factors: stress, lifting children Relieving factors: lidocaine roll on which helps more than CBD oil.  Warm bath. Heat pad.  Chiropractor.  Massage. Theragun.  (All short term relief) theracane   PRECAUTIONS: None      PLOF: Independent; Pt has a hx of chronic pain though was able to perform her daily activities with less pain prior to this exacerbation 3 years ago.     PATIENT GOALS to sleep  better.  Improved pain, to feel better   OBJECTIVE:   -Reviewed response to prior Rx, HEP compliance, pain levels, home management strategies, and current function.   -Pt performed: -chin tucks 2x10 reps -seated Scap shoulder depression 2x15.  -standing ER with YTB 2x10 rep -rows with retraction with YTB and with RTB x10 reps each -standing shoulder extension with retraction with YTB and RTB x10 reps each -Reviewed HEP and instructed pt in appropriate frequency and correct form with T band exercises for HEP.      Manual Therapy: IASTM to bilat UT and mid trap in sitting to improve pain and soft tissue tightness and to reduce myofascial restrictions and adhesions.     PATIENT EDUCATION:  Education details: Rationale for TE and MT.  POC and HEP. Instructed pt in performing T band exercises at home including appropriate frequency, correct form, and appropriate response.  Instructed pt she should not have any increased pain including having no rib pain.  Instructed pt to stop if she has rib pain.      Person educated: Patient Education method: Explanation, Demonstration Education comprehension: verbalized understanding, returned demonstration     HOME EXERCISE PROGRAM: Access Code: [email or web address] URL: [email or web address] Date: 12/20/2020 Prepared by: Ronny Flurry   Exercises Seated Scapular Retraction - 2 x daily - 7 x weekly - 1-2 sets - 10 reps Seated Cervical Retraction - 2 x daily - 7 x weekly - 2 sets - 10 reps    ASSESSMENT:   CLINICAL IMPRESSION: Pt presents to Rx reporting a good improvement in pain after prior Rx of dry needling.  Pt continues to have a trigger point in R UT though has decreased and she has reduced soft tissue tightness in R UT.  PT progressed exercises and pt performed well  without c/o's.  She had no rib pain or cervical pain during exercises.  She responded well to Rx having no increased pain after Rx and reports improved tightness after  IASTM.  Pt should benefit from cont skilled PT services to address impairments and goals and to assist in restoring PLOF.      Objective impairments include weakness, decreased activity tolerance, increased muscle spasms, impaired flexibility, postural dysfunction, and pain. These impairments are limiting patient from cleaning and taking care of children . Personal factors including Time since onset of injury/illness/exacerbation and 1 comorbidity: migraines with aura  are also affecting patient's functional outcome.        GOALS:     SHORT TERM GOALS:   STG Name Target Date Goal status  1 Pt will be independent and compliant with HEP for improved pain, posture, tolerance to activity, and function.  Baseline:  01/03/2021 PARTIALLY MET  2 Pt will report at least a 25% improvement in pain and sx's overall Baseline:  01/10/2021 ONGOING  3 Pt will demo improved posture while in the clinic and report she is able to maintain improved posture outside of the clinic with reduced pain.   Baseline: 01/10/2021 PARTIALLY MET  4 Pt will demo improved tightness in bilat UT for reduced tension and pain with her daily activities and household chores. Baseline: 01/18/2021 ongoing    LONG TERM GOALS:    LTG Name Target Date Goal status  1 Pt will be able to sleep at least 4-5 nights per week without pain waking her up.  Baseline: 04/04/2021 NOT MET  2 Pt will report at least a 70% improvement in pain and sx's overall Baseline: 04/04/2021 ONGOING  3 Pt will report she is able to carry her children and take care of them without significant pain or difficulty.  Baseline: 04/04/2021 NOT MET  4 Pt will be able to perform her household chores and IADLs without significant pain or difficulty.  Baseline: 04/04/2021 PARTIALLY MET                               PLAN:  PLANNED INTERVENTIONS: Therapeutic exercises, Therapeutic activity, Neuro Muscular re-education, Patient/Family education, Joint mobilization,  Aquatic Therapy, Dry Needling, Electrical stimulation, Spinal mobilization, Cryotherapy, Moist heat, Taping, Ultrasound, and Manual therapy   PLAN FOR NEXT SESSION: Continue with Manual techniques including STM and IASTM and dry needling.  Postural/scap stab's per pt tolerance with consideration of recent rib fractures.  Review and perform HEP.      Selinda Michaels III PT, DPT 03/14/21 2:50 PM

## 2021-03-16 ENCOUNTER — Other Ambulatory Visit: Payer: Self-pay

## 2021-03-16 ENCOUNTER — Ambulatory Visit: Payer: Medicaid Other | Admitting: Family Medicine

## 2021-03-16 ENCOUNTER — Encounter: Payer: Self-pay | Admitting: Family Medicine

## 2021-03-16 VITALS — BP 103/67 | HR 94 | Temp 98.2°F | Ht 66.0 in | Wt 112.4 lb

## 2021-03-16 DIAGNOSIS — J329 Chronic sinusitis, unspecified: Secondary | ICD-10-CM

## 2021-03-16 MED ORDER — DOXYCYCLINE HYCLATE 100 MG PO TABS: 100.0000 mg | ORAL_TABLET | Freq: Two times a day (BID) | ORAL | 0 refills | Status: DC

## 2021-03-16 NOTE — Patient Instructions (Signed)
It was very nice to see you today!  Please start the doxycycline.  You can start the eyedrops if your symptoms worsen.  Let us know if not improving by next week.  Take care, Dr Jerline Pain  PLEASE NOTE:  If you had any lab tests please let us know if you have not heard back within a few days. You may see your results on mychart before we have a chance to review them but we will give you a call once they are reviewed by [place]. If we ordered any referrals today, please let us know if you have not heard from their office within the next week.   Please try these tips to maintain a healthy lifestyle:  Eat at least 3 REAL meals and 1-2 snacks per day.  Aim for no more than 5 hours between eating.  If you eat breakfast, please do so within one hour of getting up.   Each meal should contain half fruits/vegetables, one quarter protein, and one quarter carbs (no bigger than a computer mouse)  Cut down on sweet beverages. This includes juice, soda, and sweet tea.   Drink at least 1 glass of water with each meal and aim for at least 8 glasses per day  Exercise at least 150 minutes every week.

## 2021-03-16 NOTE — Progress Notes (Signed)
° °  Mary Cochran is a 26 y.o. female who presents today for an office visit.  Assessment/Plan:  Sinusitis No red flags.  Given length of symptoms we will start doxycycline.  She has amoxicillin and penicillin allergy.  She does have mild conjunctivitis which is probably viral.  She has antibacterial eyedrops on hand to use in case this worsens.  We discussed signs and symptoms of bacterial conjunctivitis.  Encouraged hydration.  She also has some prednisone on hand from her urgent care visit.  She will not start yet though would consider starting soon if symptoms do not improve.  We discussed reasons to return to care.  Follow-up as needed.    Subjective:  HPI:  Patient here with concern for conjunctivitis. Symptoms started few days ago. She notes she had sore throat and lots of nasal congestion. She had no taste and smell. She went to urgent care for this issue. They were concern for URI. She was prescribed nasal spray and prednisone 50 mg at that time. Had Covid and strept test which was negative. Today, she notes her sore throat has resolved. However, she notes woke up with crusty eye this morning.She was unable to open her eye. Had some itch. she still have some drainage and nasal congestion. She notes both of her kid had the same issue. No fever or chills. No pain. No vision changes. No other sick contacts.        Objective:  Physical Exam: BP 103/67 (BP Location: Left Arm)    Pulse 94    Temp 98.2 F (36.8 C) (Temporal)    Ht 5\' 6"  (1.676 m)    Wt 112 lb 6.4 oz (51 kg)    LMP 02/27/2021 (Exact Date)    SpO2 97%    BMI 18.14 kg/m   Gen: No acute distress, resting comfortably HEENT: TMs with clear effusion.  OP erythematous.  Nasal mucosa erythematous and boggy bilaterally.  Maxillary sinuses with decreased transillumination bilaterally. CV: Regular rate and rhythm with no murmurs appreciated Pulm: Normal work of breathing, clear to auscultation bilaterally with no crackles, wheezes,  or rhonchi Neuro: Grossly normal, moves all extremities Psych: Normal affect and thought content       I,Savera Zaman,acting as a scribe for Dimas Chyle, MD.,have documented all relevant documentation on the behalf of Dimas Chyle, MD,as directed by  Dimas Chyle, MD while in the presence of Dimas Chyle, MD.   I, Dimas Chyle, MD, have reviewed all documentation for this visit. The documentation on 03/16/21 for the exam, diagnosis, procedures, and orders are all accurate and complete.  Algis Greenhouse. Jerline Pain, MD 03/16/2021 11:53 AM

## 2021-03-17 DIAGNOSIS — N926 Irregular menstruation, unspecified: Secondary | ICD-10-CM | POA: Diagnosis not present

## 2021-03-17 DIAGNOSIS — R6882 Decreased libido: Secondary | ICD-10-CM | POA: Diagnosis not present

## 2021-03-17 DIAGNOSIS — N898 Other specified noninflammatory disorders of vagina: Secondary | ICD-10-CM | POA: Diagnosis not present

## 2021-03-17 DIAGNOSIS — L709 Acne, unspecified: Secondary | ICD-10-CM | POA: Diagnosis not present

## 2021-03-17 DIAGNOSIS — R61 Generalized hyperhidrosis: Secondary | ICD-10-CM | POA: Diagnosis not present

## 2021-03-22 ENCOUNTER — Ambulatory Visit (HOSPITAL_BASED_OUTPATIENT_CLINIC_OR_DEPARTMENT_OTHER): Payer: Medicaid Other | Admitting: Physical Therapy

## 2021-03-29 ENCOUNTER — Encounter (HOSPITAL_BASED_OUTPATIENT_CLINIC_OR_DEPARTMENT_OTHER): Payer: Self-pay | Admitting: Physical Therapy

## 2021-04-05 ENCOUNTER — Encounter (HOSPITAL_BASED_OUTPATIENT_CLINIC_OR_DEPARTMENT_OTHER): Payer: Self-pay | Admitting: Physical Therapy

## 2021-04-06 ENCOUNTER — Ambulatory Visit (HOSPITAL_BASED_OUTPATIENT_CLINIC_OR_DEPARTMENT_OTHER): Payer: Medicaid Other | Attending: Physician Assistant | Admitting: Physical Therapy

## 2021-04-06 ENCOUNTER — Encounter (HOSPITAL_BASED_OUTPATIENT_CLINIC_OR_DEPARTMENT_OTHER): Payer: Self-pay | Admitting: Physical Therapy

## 2021-04-06 ENCOUNTER — Other Ambulatory Visit: Payer: Self-pay

## 2021-04-06 DIAGNOSIS — M542 Cervicalgia: Secondary | ICD-10-CM | POA: Diagnosis not present

## 2021-04-06 DIAGNOSIS — M546 Pain in thoracic spine: Secondary | ICD-10-CM | POA: Diagnosis present

## 2021-04-06 DIAGNOSIS — R293 Abnormal posture: Secondary | ICD-10-CM | POA: Diagnosis present

## 2021-04-06 NOTE — Therapy (Signed)
OUTPATIENT PHYSICAL THERAPY RE-CERT NOTE   Patient Name: MATIA ZELADA MRN: [id number] DOB:[birth year], 26 y.o., female Today's Date: 04/06/2021  PCP: [name], [place] REFERRING PROVIDER: [name], PA   PT End of Session - 04/06/21 1058     Visit Number 7    Number of Visits 16    Date for PT Re-Evaluation 07/05/21    Authorization Type [employer] MCD    PT Start Time 1020    PT Stop Time 1100    PT Time Calculation (min) 40 min    Activity Tolerance Patient tolerated treatment well    Behavior During Therapy [facility] for tasks assessed/performed                Past Medical History:  Diagnosis Date   ALLERGIC RHINITIS 12/12/2006   Allergy    SEASONAL   Anxiety    Depression 10/03/2011   GILBERT'S SYNDROME 03/07/2010   Headache    Migraines   IBS (irritable bowel syndrome) 10/04/2015   INSOMNIA-SLEEP DISORDER-UNSPEC 04/18/2007   Tubular adenoma of colon 10/2014   Past Surgical History:  Procedure Laterality Date   COLONOSCOPY  2016   MOUTH SURGERY     TONSILLECTOMY     Patient Active Problem List   Diagnosis Date Noted   Nonallopathic lesion of sacral region 10/26/2015   Nonallopathic lesion of thoracic region 10/26/2015   Nonallopathic lesion of lumbosacral region 10/26/2015   Scapular dysfunction 10/26/2015   IBS (irritable bowel syndrome) 10/04/2015   Adenomatous polyp of colon 10/04/2015   Polyarthralgia 10/04/2015   Eczema of both hands 09/30/2013   Skin lesion 10/16/2012   Vertigo 04/06/2012   Migraine with aura 04/06/2012   Anxiety 10/30/2011   Depression 10/03/2011   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   SYNCOPE 10/07/2008   INSOMNIA-SLEEP DISORDER-UNSPEC 04/18/2007   ALLERGIC RHINITIS 12/12/2006     REFERRING PROVIDER: [name], PA   REFERRING DIAG: [phone] (ICD-10-CM) - Chronic midline back pain, unspecified back location    THERAPY DIAG:  Cervicalgia   Pain in thoracic spine   Abnormal posture       ONSET  DATE: Exacerbation 3 years ago; 12/02/2020 MD visit/script   SUBJECTIVE:                                                                                                                                                                                                          SUBJECTIVE STATEMENT:      -Pt had the flu during Thanksgiving and had fractured ribs due to coughing.  Pt  had x rays on 01/03/21 2 inferior lateral L sided rib fractures nondisplaced 8 and 9 and no visible rib fractures on R.  Pt became sick again with a cold and had more coughing experiencing R sided rib pain.  Pt had x rays and showed no fracture on R and L sided though not sure if re-injured L sided ribs.  She states she is completely fine now not having any rib pain.   -Pt had a bone density scan on 01/06/21 which was normal but revealed slightly low levels.  MD note indicated pt was advised to supplement w/ calcium, vitamin D, and weightbearing exercise    -Pt states that she felt better after last session. She states that just recently started having more neck pain and tension HA. Pt states that R is more painful that will cause achiness into the whole shoulder. Pt does have increased stress with recent family surgery.    PERTINENT HISTORY:  Chronic pain.  Hx of a lumbar bulging disc.  Migraines with aura, syncope, and vertigo.     PAIN:  Are you having pain? Yes NPRS scale: 7/10 current, 8/10 worst, 4/10 best Pain location: cervical, R UT >L, and thoracic PAIN TYPE: aching and tense Pain description: constant  Aggravating factors: stress, lifting children Relieving factors: lidocaine roll on which helps more than CBD oil.  Warm bath. Heat pad.  Chiropractor.  Massage. Theragun.  (All short term relief) theracane   PRECAUTIONS: None      PLOF: Independent; Pt has a hx of chronic pain though was able to perform her daily activities with less pain prior to this exacerbation 3 years ago.     PATIENT GOALS to sleep  better.  Improved pain, to feel better   OBJECTIVE:   CERVICAL AROM/PROM   A/PROM A/PROM (deg) 3/2  Flexion WFL  Extension WNL  Right lateral flexion WNL  Left lateral flexion WNL  Right rotation WNL  Left rotation WNL   (Blank rows = not tested)   UE AROM/PROM:   A/PROM Right 12/20/2020 Left 12/20/2020  Shoulder flexion WNL WNL  Shoulder extension      Shoulder abduction WNL WNL   (Blank rows = not tested)   UE MMT:   MMT Right 3/2 Left 3/2  Shoulder flexion 5/5 5/5  Shoulder extension      Shoulder abduction 5/5 5/5  Shoulder ER 4+/5 5/5  Shoulder IR WFL WFL  Middle trapezius      Lower trapezius      Elbow flexion 5/5 5/5   (Blank rows = not tested)   CERVICAL SPECIAL TESTS:  Spurling's test: Negative and compression test: negative   PALPATION:  TTP:  bilat UT, cervical and thoracic paraspinals. Pt had moderate soft tissue tightness in bilat UT.         PATIENT SURVEYS:  FOTO 60  62 at 7th visit 04/06/21  Review of CKC scapular protraction during yoga, HEP modifications, self strengthening focus vs stretch with yoga UT bilat K-tape approximation from distal to proximal  Bent over scaption with focus on protraction of down arm 10x each side 4lb DB shrugs 3x10    Manual Therapy: STM bilat UT  TPDN bilat UT; skilled palpation throughout with monitoring of soft tissue response throughout     PATIENT EDUCATION:  Education details: anatomy, exercise progression, shoulder girdle activation, muscle firing,  envelope of function, HEP, POC  Person educated: Patient Education method: Explanation, Demonstration Education comprehension: verbalized understanding, returned demonstration  HOME EXERCISE PROGRAM: Access Code: [email or web address] URL: [email or web address] Date: 12/20/2020 Prepared by: Ronny Flurry   Exercises Seated Scapular Retraction - 2 x daily - 7 x weekly - 1-2 sets - 10 reps Seated Cervical Retraction - 2 x daily - 7 x  weekly - 2 sets - 10 reps    ASSESSMENT:   CLINICAL IMPRESSION: Pt present to clinic today with increased R sided shoulder pain that is likely tied to pts recent increase in stress and report non-compliance with strengthening exercise. Pt advised to continue with strengthening at home and continue with more stability/power yoga vs long duration stretch holds. Pt does demonstrates improvement with strength and ROM but does continue to have endurance and strength deficits that are limiting her long term resolution of pain. Pt verbally given updates to HEP strengthening and advised to continue with upper quarter stability. Trialed K-taping of bilat traps at this session. Assess for response at next. Pt should benefit from cont skilled PT services to address impairments and goals and to assist in restoring PLOF.      Objective impairments include weakness, decreased activity tolerance, increased muscle spasms, impaired flexibility, postural dysfunction, and pain. These impairments are limiting patient from cleaning and taking care of children . Personal factors including Time since onset of injury/illness/exacerbation and 1 comorbidity: migraines with aura  are also affecting patient's functional outcome.        GOALS:     SHORT TERM GOALS:   STG Name Target Date Goal status  1 Pt will be independent and compliant with HEP for improved pain, posture, tolerance to activity, and function.  Baseline:  01/03/2021 PARTIALLY MET  2 Pt will report at least a 25% improvement in pain and sx's overall Baseline:  01/10/2021 MET  3 Pt will demo improved posture while in the clinic and report she is able to maintain improved posture outside of the clinic with reduced pain.   Baseline: 01/10/2021 MET  4 Pt will demo improved tightness in bilat UT for reduced tension and pain with her daily activities and household chores. Baseline: 01/18/2021 MET    LONG TERM GOALS:    LTG Name Target Date Goal status  1  Pt will be able to sleep at least 4-5 nights per week without pain waking her up.  Baseline: 05/18/2021 NOT MET  2 Pt will report at least a 70% improvement in pain and sx's overall Baseline: 05/18/2021 ONGOING  3 Pt will report she is able to carry her children and take care of them without significant pain or difficulty.  Baseline: 05/18/2021  Ongoing  4 Pt will be able to perform her household chores and IADLs without significant pain or difficulty.  Baseline: 05/18/2021 PARTIALLY MET                               PLAN:  Frequency: 1-2x week 6 weeks  PLANNED INTERVENTIONS: Therapeutic exercises, Therapeutic activity, Neuro Muscular re-education, Patient/Family education, Joint mobilization, Aquatic Therapy, Dry Needling, Electrical stimulation, Spinal mobilization, Cryotherapy, Moist heat, Taping, Ultrasound, and Manual therapy   PLAN FOR NEXT SESSION: Continue with Manual techniques including STM and IASTM and dry needling.  Postural/scap stab's per pt tolerance with consideration of recent rib fractures.  Review and perform HEP.      Daleen Bo PT, DPT 04/06/21 12:43 PM

## 2021-04-17 ENCOUNTER — Encounter (HOSPITAL_BASED_OUTPATIENT_CLINIC_OR_DEPARTMENT_OTHER): Payer: Medicaid Other | Admitting: Physical Therapy

## 2021-04-25 ENCOUNTER — Encounter (HOSPITAL_BASED_OUTPATIENT_CLINIC_OR_DEPARTMENT_OTHER): Payer: Self-pay | Admitting: Physical Therapy

## 2021-04-25 ENCOUNTER — Ambulatory Visit (HOSPITAL_BASED_OUTPATIENT_CLINIC_OR_DEPARTMENT_OTHER): Payer: Medicaid Other | Admitting: Physical Therapy

## 2021-04-25 ENCOUNTER — Other Ambulatory Visit: Payer: Self-pay

## 2021-04-25 DIAGNOSIS — R293 Abnormal posture: Secondary | ICD-10-CM

## 2021-04-25 DIAGNOSIS — M542 Cervicalgia: Secondary | ICD-10-CM | POA: Diagnosis not present

## 2021-04-25 DIAGNOSIS — M546 Pain in thoracic spine: Secondary | ICD-10-CM

## 2021-04-25 NOTE — Therapy (Addendum)
OUTPATIENT PHYSICAL THERAPY RE-CERT NOTE  PHYSICAL THERAPY DISCHARGE SUMMARY  Visits from Start of Care: 8  Plan: Patient agrees to discharge.  Patient goals were not met. Patient is being discharged due to not returning to PT.       Patient Name: Mary Cochran MRN: [id number] DOB:20-Jan-1996, 26 y.o., female Today's Date: 04/25/2021  PCP: [name], [place] REFERRING PROVIDER: [name], PA   PT End of Session - 04/25/21 1100     Visit Number 8    Number of Visits 16    Date for PT Re-Evaluation 07/05/21    Authorization Type [employer] MCD    PT Start Time 7793    PT Stop Time 1055    PT Time Calculation (min) 40 min    Activity Tolerance Patient tolerated treatment well    Behavior During Therapy [facility] for tasks assessed/performed                 Past Medical History:  Diagnosis Date   ALLERGIC RHINITIS 12/12/2006   Allergy    SEASONAL   Anxiety    Depression 10/03/2011   GILBERT'S SYNDROME 03/07/2010   Headache    Migraines   IBS (irritable bowel syndrome) 10/04/2015   INSOMNIA-SLEEP DISORDER-UNSPEC 04/18/2007   Tubular adenoma of colon 10/2014   Past Surgical History:  Procedure Laterality Date   COLONOSCOPY  2016   MOUTH SURGERY     TONSILLECTOMY     Patient Active Problem List   Diagnosis Date Noted   Nonallopathic lesion of sacral region 10/26/2015   Nonallopathic lesion of thoracic region 10/26/2015   Nonallopathic lesion of lumbosacral region 10/26/2015   Scapular dysfunction 10/26/2015   IBS (irritable bowel syndrome) 10/04/2015   Adenomatous polyp of colon 10/04/2015   Polyarthralgia 10/04/2015   Eczema of both hands 09/30/2013   Skin lesion 10/16/2012   Vertigo 04/06/2012   Migraine with aura 04/06/2012   Anxiety 10/30/2011   Depression 10/03/2011   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  Pt had x rays on 01/03/21 2 inferior lateral L sided rib fractures nondisplaced 8 and 9 and no visible rib fractures on R.  Pt became sick again with a cold and had more coughing experiencing R sided rib pain.  Pt had x rays and showed no fracture on R and L sided though not sure if re-injured L sided ribs.  She states she is completely fine now not having any rib pain.   -Pt had a bone density scan on 01/06/21 which was normal but revealed slightly low levels.  MD note indicated pt was advised to supplement w/ calcium, vitamin D, and weightbearing exercise    3/21  -Pt states she felt better after last session. She has not really had HA since last session. She has improvement in pain with taping after last session. Pt states that sleeping is much better and she notices less time spending trying to massage it.    PERTINENT HISTORY:  Chronic pain.  Hx of a lumbar bulging disc.  Migraines with aura, syncope, and vertigo.     PAIN:  Are you having pain? Yes NPRS scale: 3/10 current, 8/10 worst, 4/10 best Pain location: cervical, R UT >L, and thoracic PAIN TYPE: aching and tense Pain description: constant  Aggravating factors: stress, lifting children Relieving factors: lidocaine roll on which helps more than CBD oil.  Warm bath. Heat pad.  Chiropractor.  Massage. Theragun.  (All short term relief) theracane    PRECAUTIONS: None      PLOF: Independent; Pt has a hx of chronic pain though was able to perform her daily activities with less pain prior to this exacerbation 3 years ago.     PATIENT GOALS to sleep better.  Improved pain, to feel better   OBJECTIVE:   CERVICAL AROM/PROM   A/PROM A/PROM (deg) 3/2  Flexion WFL  Extension WNL  Right lateral flexion WNL  Left lateral flexion WNL  Right rotation WNL  Left rotation WNL   (Blank rows = not tested)   UE AROM/PROM:   A/PROM Right 12/20/2020 Left 12/20/2020  Shoulder flexion WNL WNL  Shoulder extension      Shoulder abduction WNL WNL   (Blank rows = not tested)   UE MMT:   MMT Right 3/2 Left 3/2  Shoulder flexion 5/5 5/5  Shoulder extension      Shoulder abduction 5/5 5/5  Shoulder ER 4+/5 5/5  Shoulder IR WFL WFL  Middle trapezius      Lower trapezius      Elbow flexion 5/5 5/5   (Blank rows = not tested)   CERVICAL SPECIAL TESTS:  Spurling's test: Negative and compression test: negative   PALPATION:  TTP:  bilat UT, cervical and thoracic paraspinals. Pt had moderate soft tissue tightness in bilat UT.         PATIENT SURVEYS:  FOTO 60  62 at 7th visit 04/06/21  Review of CKC scapular protraction during yoga, HEP modifications, stability focus with yoga UT bilat K-tape approximation from distal to proximal  Quadruped protraction 2x10 Stars/ diagonals with RTB 3 directions- 45x Standing shoulder extension RTB 10 Shrugs, rowing and shoulder extensions reviewed for home   Manual Therapy: STM R  UT   TPDN R UT; skilled palpation throughout with monitoring of soft tissue response throughout     PATIENT EDUCATION:  Education details: anatomy, exercise progression, shoulder  girdle activation, muscle firing,  envelope of function, HEP, POC  Person educated: Patient Education method: Explanation, Demonstration Education comprehension: verbalized understanding, returned demonstration     HOME EXERCISE  PROGRAM: Access Code: [email or web address] URL: [email or web address] Date: 04/25/2021 Prepared by: Daleen Bo  Exercises Shoulder External Rotation and Scapular Retraction with Resistance - 1 x daily - 7 x weekly - 2 sets - 10 reps Standing Shoulder Shrug and Retraction with Resistance - 1 x daily - 7 x weekly - 2 sets - 10 reps Quadruped Scapular Protraction and Retraction - 1 x daily - 7 x weekly - 3 sets - 10 reps Standing Shoulder Diagonal Horizontal Abduction 60/120 Degrees with Resistance - 1 x daily - 7 x weekly - 1 sets - 5 reps     ASSESSMENT:   CLINICAL IMPRESSION: Pt with good response to decreased amount of manual and TPDN at today's session, though still have R sided hypertonicity present. L side not as in spasm as last session. Pt able to progress CKC loaded exercise as well as increasing scapular resistance exercise. Pt required increase cuing with protraction as she tends to sit with scap winging in loaded position. HEP updated and reviewed. Pt with god response to K-tape at last session with report of decrease pain so reapplied again today. Plan to continue with upper quarter strength as tolerated. Pt should benefit from cont skilled PT services to address impairments and goals and to assist in restoring PLOF.      Objective impairments include weakness, decreased activity tolerance, increased muscle spasms, impaired flexibility, postural dysfunction, and pain. These impairments are limiting patient from cleaning and taking care of children . Personal factors including Time since onset of injury/illness/exacerbation and 1 comorbidity: migraines with aura  are also affecting patient's functional outcome.        GOALS:     SHORT TERM GOALS:   STG Name Target Date Goal status  1 Pt will be independent and compliant with HEP for improved pain, posture, tolerance to activity, and function.  Baseline:  01/03/2021 PARTIALLY MET  2 Pt will report at least a 25% improvement  in pain and sx's overall Baseline:  01/10/2021 MET  3 Pt will demo improved posture while in the clinic and report she is able to maintain improved posture outside of the clinic with reduced pain.   Baseline: 01/10/2021 MET  4 Pt will demo improved tightness in bilat UT for reduced tension and pain with her daily activities and household chores. Baseline: 01/18/2021 MET    LONG TERM GOALS:    LTG Name Target Date Goal status  1 Pt will be able to sleep at least 4-5 nights per week without pain waking her up.  Baseline: 05/18/2021 NOT MET  2 Pt will report at least a 70% improvement in pain and sx's overall Baseline: 05/18/2021 ONGOING  3 Pt will report she is able to carry her children and take care of them without significant pain or difficulty.  Baseline: 06/06/2021  Ongoing  4 Pt will be able to perform her household chores and IADLs without significant pain or difficulty.  Baseline: 05/18/2021 PARTIALLY MET                               PLAN:  Frequency: 1-2x week 6 weeks  PLANNED INTERVENTIONS: Therapeutic exercises, Therapeutic activity, Neuro Muscular re-education, Patient/Family education, Joint mobilization, Aquatic Therapy, Dry Needling, Electrical stimulation, Spinal mobilization, Cryotherapy, Moist heat, Taping,  Ultrasound, and Manual therapy   PLAN FOR NEXT SESSION: Continue with Manual techniques including STM and IASTM and dry needling.  Postural/scap stab's per pt tolerance with consideration of recent rib fractures.  Review and perform HEP.      Daleen Bo PT, DPT 04/25/21 11:00 AM

## 2021-05-02 ENCOUNTER — Ambulatory Visit (HOSPITAL_BASED_OUTPATIENT_CLINIC_OR_DEPARTMENT_OTHER): Payer: Medicaid Other | Admitting: Physical Therapy

## 2021-05-09 ENCOUNTER — Ambulatory Visit (HOSPITAL_BASED_OUTPATIENT_CLINIC_OR_DEPARTMENT_OTHER): Payer: Medicaid Other | Admitting: Physical Therapy

## 2021-05-16 ENCOUNTER — Ambulatory Visit (HOSPITAL_BASED_OUTPATIENT_CLINIC_OR_DEPARTMENT_OTHER): Payer: Medicaid Other | Admitting: Physical Therapy

## 2021-05-23 ENCOUNTER — Encounter (HOSPITAL_BASED_OUTPATIENT_CLINIC_OR_DEPARTMENT_OTHER): Payer: Self-pay | Admitting: Physical Therapy

## 2021-05-25 ENCOUNTER — Encounter: Payer: Self-pay | Admitting: Physician Assistant

## 2021-05-25 MED ORDER — LISDEXAMFETAMINE DIMESYLATE 20 MG PO CAPS: 20.0000 mg | ORAL_CAPSULE | Freq: Every day | ORAL | 0 refills | Status: DC

## 2021-05-25 NOTE — Telephone Encounter (Signed)
I have refilled x 1 more month. Needs appointment for any further refills. ? ? ?

## 2021-05-26 ENCOUNTER — Other Ambulatory Visit: Payer: Self-pay | Admitting: Physician Assistant

## 2021-05-30 ENCOUNTER — Ambulatory Visit (HOSPITAL_BASED_OUTPATIENT_CLINIC_OR_DEPARTMENT_OTHER): Payer: Medicaid Other | Admitting: Physical Therapy

## 2021-06-08 ENCOUNTER — Encounter: Payer: Self-pay | Admitting: Physician Assistant

## 2021-06-08 ENCOUNTER — Ambulatory Visit (INDEPENDENT_AMBULATORY_CARE_PROVIDER_SITE_OTHER): Payer: Medicaid Other | Admitting: Physician Assistant

## 2021-06-08 VITALS — BP 102/70 | HR 77 | Temp 98.2°F | Ht 66.0 in | Wt 114.0 lb

## 2021-06-08 DIAGNOSIS — F419 Anxiety disorder, unspecified: Secondary | ICD-10-CM | POA: Diagnosis not present

## 2021-06-08 DIAGNOSIS — R41840 Attention and concentration deficit: Secondary | ICD-10-CM

## 2021-06-08 HISTORY — DX: Attention and concentration deficit: R41.840

## 2021-06-08 MED ORDER — LISDEXAMFETAMINE DIMESYLATE 20 MG PO CAPS: 20.0000 mg | ORAL_CAPSULE | Freq: Every day | ORAL | 0 refills | Status: DC

## 2021-06-08 MED ORDER — HYDROXYZINE HCL 10 MG PO TABS: 10.0000 mg | ORAL_TABLET | Freq: Three times a day (TID) | ORAL | 0 refills | Status: DC | PRN

## 2021-06-08 NOTE — Progress Notes (Signed)
Mary Cochran is a 26 y.o. female here for a follow up of a pre-existing problem. ? ?History of Present Illness:  ? ?Chief Complaint  ?Patient presents with  ? ADHD  ?  Needs refill  ? ? ?HPI ? ? ?ADHD; Anxiety ?Patient present for ADHD follow up. Currently taking Vyvanse 20 mg most time of the week. She notes she has been taking her dose in the morning. She is tolerating her medication well. No side effects.  ? ?She is also taking Zoloft 50 mg daily. She notes she has been experiencing intrusive thoughts recently. She has been feeling very emotional seeing other kids getting hurt -- hearing children cry in stores or watching the news causes emotional spiraling. No other side effects.  She has seen therapist in the past. This seems to be helping. However, she has not seen recently.  ? ?Denies SI/HI ? ?Has xanax prn but does not use ? ? ?Past Medical History:  ?Diagnosis Date  ? ALLERGIC RHINITIS 12/12/2006  ? Allergy   ? SEASONAL  ? Anxiety   ? Depression 10/03/2011  ? GILBERT'S SYNDROME 03/07/2010  ? Headache   ? Migraines  ? IBS (irritable bowel syndrome) 10/04/2015  ? INSOMNIA-SLEEP DISORDER-UNSPEC 04/18/2007  ? Tubular adenoma of colon 10/2014  ? ?  ?Social History  ? ?Tobacco Use  ? Smoking status: Never  ? Smokeless tobacco: Never  ?Vaping Use  ? Vaping Use: Never used  ?Substance Use Topics  ? Alcohol use: Yes  ?  Alcohol/week: 0.0 standard drinks  ?  Comment: OCC.  ? Drug use: No  ? ? ?Past Surgical History:  ?Procedure Laterality Date  ? COLONOSCOPY  2016  ? MOUTH SURGERY    ? TONSILLECTOMY    ? ? ?Family History  ?Problem Relation Age of Onset  ? Breast cancer Maternal Grandmother   ? Heart disease Maternal Grandmother   ? Miscarriages / Stillbirths Maternal Grandmother   ? Pancreatic cancer Other   ? Kidney cancer Maternal Aunt   ? Heart disease Father   ? Heart disease Maternal Grandfather   ? Varicose Veins Maternal Grandfather   ? Varicose Veins Mother   ? Colon polyps Mother   ? Colon cancer Neg Hx   ?  Esophageal cancer Neg Hx   ? Rectal cancer Neg Hx   ? Stomach cancer Neg Hx   ? ? ?Allergies  ?Allergen Reactions  ? Amoxicillin Hives  ? Azithromycin Other (See Comments)  ?  Severe stomach ache  ? Hydrocodone Other (See Comments)  ?  GI upset  ? Morphine   ?  Unknown, childhood reaction  ? Ciprofloxacin Hcl Hives  ? ? ?Current Medications:  ? ?Current Outpatient Medications:  ?  hydrOXYzine (ATARAX) 10 MG tablet, Take 1 tablet (10 mg total) by mouth 3 (three) times daily as needed., Disp: 30 tablet, Rfl: 0 ?  lisdexamfetamine (VYVANSE) 20 MG capsule, Take 1 capsule (20 mg total) by mouth daily before breakfast., Disp: 30 capsule, Rfl: 0 ?  Multiple Vitamin (MULTIVITAMIN PO), Take by mouth., Disp: , Rfl:  ?  paragard intrauterine copper IUD IUD, by Intrauterine route., Disp: , Rfl:  ?  Probiotic Product (PROBIOTIC PO), Take by mouth., Disp: , Rfl:  ?  sertraline (ZOLOFT) 50 MG tablet, Take 1 tablet (50 mg total) by mouth at bedtime., Disp: 90 tablet, Rfl: 3 ?  Ubrogepant (UBRELVY) 100 MG TABS, Take 1 tablet by mouth as needed (May repeat in 2 hours.  Maximum 2  tablets in 24 hours)., Disp: 10 tablet, Rfl: 11  ? ?Review of Systems:  ? ?ROS ?Negative unless otherwise specified per HPI.  ? ?Vitals:  ? ?Vitals:  ? 06/08/21 0904  ?BP: 102/70  ?Pulse: 77  ?Temp: 98.2 ?F (36.8 ?C)  ?TempSrc: Temporal  ?SpO2: 95%  ?Weight: 114 lb (51.7 kg)  ?Height: '5\' 6"'$  (1.676 m)  ?   ?Body mass index is 18.4 kg/m?. ? ?Physical Exam:  ? ?Physical Exam ?Vitals and nursing note reviewed.  ?Constitutional:   ?   General: She is not in acute distress. ?   Appearance: She is well-developed. She is not ill-appearing or toxic-appearing.  ?Cardiovascular:  ?   Rate and Rhythm: Normal rate and regular rhythm.  ?   Pulses: Normal pulses.  ?   Heart sounds: Normal heart sounds, S1 normal and S2 normal.  ?Pulmonary:  ?   Effort: Pulmonary effort is normal.  ?   Breath sounds: Normal breath sounds.  ?Skin: ?   General: Skin is warm and dry.   ?Neurological:  ?   Mental Status: She is alert.  ?   GCS: GCS eye subscore is 4. GCS verbal subscore is 5. GCS motor subscore is 6.  ?Psychiatric:     ?   Speech: Speech normal.     ?   Behavior: Behavior normal. Behavior is cooperative.  ? ? ?Assessment and Plan:  ? ?Attention and concentration deficit ?Well controlled ?PDMP reviewed -- no red flags ?Continue vyvanse 20 mg daily ?Follow-up in 3-6 months, sooner if concerns ? ?Anxiety ?Uncontrolled ?Increase zoloft to 75 mg daily -- she is going to do this on her own and let us know if she would like to continue this ?Add hydroxyzine 10-20 mg TID prn for panic attacks ?Provided recommendation for talk therapy [facility] [place] at [facility]) ?Follow-up in 3-6 months, sooner if concerns ? ?I,Savera Zaman,acting as a Education administrator for [employer], PA.,have documented all relevant documentation on the behalf of [name], PA,as directed by  [name], PA while in the presence of [name], [place].  ? ?IInda Coke, PA, have reviewed all documentation for this visit. The documentation on 06/08/21 for the exam, diagnosis, procedures, and orders are all accurate and complete. ? ? ?[name], PA-C ? ?

## 2021-06-08 NOTE — Patient Instructions (Addendum)
It was great to see you! ? ?Trial hydroxyzine 10 mg as needed for anxiety during day, may take up to 20 mg at a time if needed ? ?Try to increase zoloft to 75 mg daily ? ?Follow-up in 3 months for a physical and check in all of this ? ?Take care, ? ?[name] PA-C  ?

## 2021-06-28 ENCOUNTER — Encounter: Payer: Self-pay | Admitting: Physician Assistant

## 2021-07-25 ENCOUNTER — Ambulatory Visit: Payer: Medicaid Other | Admitting: Physician Assistant

## 2021-07-25 DIAGNOSIS — R059 Cough, unspecified: Secondary | ICD-10-CM | POA: Diagnosis not present

## 2021-07-25 DIAGNOSIS — J020 Streptococcal pharyngitis: Secondary | ICD-10-CM | POA: Diagnosis not present

## 2021-08-02 ENCOUNTER — Encounter: Payer: Self-pay | Admitting: Physician Assistant

## 2021-08-02 ENCOUNTER — Ambulatory Visit: Payer: Medicaid Other | Admitting: Physician Assistant

## 2021-08-02 VITALS — BP 90/60 | HR 84 | Temp 98.2°F | Ht 66.0 in | Wt 113.5 lb

## 2021-08-02 DIAGNOSIS — R41840 Attention and concentration deficit: Secondary | ICD-10-CM | POA: Diagnosis not present

## 2021-08-02 DIAGNOSIS — M25561 Pain in right knee: Secondary | ICD-10-CM

## 2021-08-02 DIAGNOSIS — E162 Hypoglycemia, unspecified: Secondary | ICD-10-CM

## 2021-08-02 NOTE — Progress Notes (Signed)
Mary Cochran is a 27 y.o. female here for a new problem of knee pain.   History of Present Illness:   Chief Complaint  Patient presents with   Knee Pain    Pt c/o right knee pain x 1 month.     HPI  Knee Pain Patient presents with c/o of right knee pain that has been onset for 1 month. Located behind the right knee. She described this as "dull pain". Worse with certain motions. States worse with walking sometimes. She is unable to bend knee during sleep due to the pain. She has tried using knee brace with some relief but no other treatment tried. Has had knee swelling about a week ago and has tried hot patch for this issue. This has resolved. States symptoms seems to be improving. She is currently doing a lot of yoga and thinks this could be contributing. Patient notes she does have fhx of DVT in her mother. Denies chest pain or shortness of breath. Currently not on any contraceptive pills. Denies leg swelling. Denies calf pain or swelling.   ADHD Patient is currently taking Vyvanse 20 mg daily with no complications. She is also taking Zoloft 50 mg daily. Tolerating her medication well. Denies any worsening anxiety sx.   Hypoglycemia  Patient complain of low blood sugars at home about a month ago. States she came [place] and was feeling shaky. She checked her blood sugar and this was found to be in the 60's. She does admit she eat a meal about a hour ago before this episodes. Does think anxiety could be playing role to this. Denies increased thirst or urination. Denies dizziness or passing out.   Past Medical History:  Diagnosis Date   ALLERGIC RHINITIS 12/12/2006   Allergy    SEASONAL   Anxiety    Depression 10/03/2011   GILBERT'S SYNDROME 03/07/2010   Headache    Migraines   IBS (irritable bowel syndrome) 10/04/2015   INSOMNIA-SLEEP DISORDER-UNSPEC 04/18/2007   Tubular adenoma of colon 10/2014     Social History   Tobacco Use   Smoking status: Never    Smokeless tobacco: Never  Vaping Use   Vaping Use: Never used  Substance Use Topics   Alcohol use: Yes    Alcohol/week: 0.0 standard drinks of alcohol    Comment: OCC.   Drug use: No    Past Surgical History:  Procedure Laterality Date   COLONOSCOPY  2016   MOUTH SURGERY     TONSILLECTOMY      Family History  Problem Relation Age of Onset   Breast cancer Maternal Grandmother    Heart disease Maternal Grandmother    Miscarriages / Stillbirths Maternal Grandmother    Pancreatic cancer Other    Kidney cancer Maternal Aunt    Heart disease Father    Heart disease Maternal Grandfather    Varicose Veins Maternal Grandfather    Varicose Veins Mother    Colon polyps Mother    Colon cancer Neg Hx    Esophageal cancer Neg Hx    Rectal cancer Neg Hx    Stomach cancer Neg Hx     Allergies  Allergen Reactions   Amoxicillin Hives   Azithromycin Other (See Comments)    Severe stomach ache   Hydrocodone Other (See Comments)    GI upset   Morphine     Unknown, childhood reaction   Ciprofloxacin Hcl Hives    Current Medications:   Current Outpatient Medications:  amoxicillin (AMOXIL) 875 MG tablet, Take 875 mg by mouth 2 (two) times daily., Disp: , Rfl:    hydrOXYzine (ATARAX) 10 MG tablet, Take 1 tablet (10 mg total) by mouth 3 (three) times daily as needed., Disp: 30 tablet, Rfl: 0   lisdexamfetamine (VYVANSE) 20 MG capsule, Take 1 capsule (20 mg total) by mouth daily before breakfast., Disp: 30 capsule, Rfl: 0   [START ON 08/07/2021] lisdexamfetamine (VYVANSE) 20 MG capsule, Take 1 capsule (20 mg total) by mouth daily before breakfast., Disp: 30 capsule, Rfl: 0   Magnesium 400 MG CAPS, Take 1 capsule by mouth daily in the afternoon., Disp: , Rfl:    Multiple Vitamin (MULTIVITAMIN PO), Take by mouth., Disp: , Rfl:    Omega-3 Fatty Acids (FISH OIL) 1000 MG CAPS, Take 2 capsules by mouth daily in the afternoon., Disp: , Rfl:    ondansetron (ZOFRAN-ODT) 4 MG disintegrating  tablet, Take 4 mg by mouth every 8 (eight) hours as needed., Disp: , Rfl:    paragard intrauterine copper IUD IUD, by Intrauterine route., Disp: , Rfl:    Probiotic Product (PROBIOTIC PO), Take by mouth., Disp: , Rfl:    sertraline (ZOLOFT) 50 MG tablet, Take 1 tablet (50 mg total) by mouth at bedtime., Disp: 90 tablet, Rfl: 3   Ubrogepant (UBRELVY) 100 MG TABS, Take 1 tablet by mouth as needed (May repeat in 2 hours.  Maximum 2 tablets in 24 hours)., Disp: 10 tablet, Rfl: 11   lisdexamfetamine (VYVANSE) 20 MG capsule, Take 1 capsule (20 mg total) by mouth daily before breakfast., Disp: 30 capsule, Rfl: 0   Review of Systems:   ROS Negative unless otherwise specified per HPI.   Vitals:   Vitals:   08/02/21 1350  BP: 90/60  Pulse: 84  Temp: 98.2 F (36.8 C)  TempSrc: Temporal  SpO2: 96%  Weight: 113 lb 8 oz (51.5 kg)  Height: '5\' 6"'$  (1.676 m)     Body mass index is 18.32 kg/m.  Physical Exam:   Physical Exam Vitals and nursing note reviewed.  Constitutional:      General: She is not in acute distress.    Appearance: She is well-developed. She is not ill-appearing or toxic-appearing.  Cardiovascular:     Rate and Rhythm: Normal rate and regular rhythm.     Pulses: Normal pulses.     Heart sounds: Normal heart sounds, S1 normal and S2 normal.  Pulmonary:     Effort: Pulmonary effort is normal.     Breath sounds: Normal breath sounds.  Musculoskeletal:     Comments: Pain elicited with resisted extension of R knee No obvious swelling/tenderness/erythema of knee or calf B/l calves of equal diameter   Skin:    General: Skin is warm and dry.  Neurological:     Mental Status: She is alert.     GCS: GCS eye subscore is 4. GCS verbal subscore is 5. GCS motor subscore is 6.  Psychiatric:        Speech: Speech normal.        Behavior: Behavior normal. Behavior is cooperative.     Assessment and Plan:   Acute pain of right knee No red flags My suspicion for DVT is very  low Offered u/s but she declined If new/worsening symptoms, she will let us know Symptoms are improving with time Recommend close follow-up if needed Wells Criteria for DVT is 0  Attention and concentration deficit No red flags Continue vyanse 20 mg daily Follow-up in 6  months, sooner if concerns  Hypoglycemia Continue to monitor Consider referral to endocrine if sx persist or change in any way Continue to keep CHO snacks/drinks nearby if symptomatic  I,Savera Zaman,acting as a scribe for [employer], PA.,have documented all relevant documentation on the behalf of [name], PA,as directed by  [name], PA while in the presence of [name], [place].   I, [name], [place], have reviewed all documentation for this visit. The documentation on 08/02/21 for the exam, diagnosis, procedures, and orders are all accurate and complete.   [name], PA-C

## 2021-08-09 ENCOUNTER — Encounter: Payer: Self-pay | Admitting: Physician Assistant

## 2021-08-09 DIAGNOSIS — L709 Acne, unspecified: Secondary | ICD-10-CM

## 2021-08-11 ENCOUNTER — Other Ambulatory Visit: Payer: Self-pay | Admitting: Physician Assistant

## 2021-08-11 MED ORDER — AMZEEQ 4 % EX FOAM: 1.0000 "application " | Freq: Every evening | CUTANEOUS | 1 refills | Status: DC

## 2021-08-14 ENCOUNTER — Other Ambulatory Visit: Payer: Self-pay | Admitting: Physician Assistant

## 2021-08-14 NOTE — Telephone Encounter (Signed)
Pt called back told her Rx Samantha sent to pharmacy for Minocycline/Amzeeq foam was denied by insurance. Pt verbalized understanding and said she has a coupon. Told her then the pharmacy should fill it for you if you want to use the coupon. Pt verbalized understanding.

## 2021-08-14 NOTE — Telephone Encounter (Signed)
Left message on voicemail to call office. Please tell pt Minocycline/Amzeeq foam that [facility] sent in for her was not covered.

## 2021-08-17 ENCOUNTER — Other Ambulatory Visit: Payer: Self-pay | Admitting: Physician Assistant

## 2021-08-17 MED ORDER — CLINDAMYCIN PHOS-BENZOYL PEROX 1-5 % EX GEL: Freq: Two times a day (BID) | CUTANEOUS | 0 refills | Status: DC

## 2021-08-17 NOTE — Telephone Encounter (Signed)
Please let patient know that the alternative medication that I sent is also not covered by her insurance.  My next recommendation is that she trial over the counter Differin gel.

## 2021-08-25 ENCOUNTER — Other Ambulatory Visit: Payer: Self-pay | Admitting: Physician Assistant

## 2021-08-28 MED ORDER — LISDEXAMFETAMINE DIMESYLATE 20 MG PO CAPS: 20.0000 mg | ORAL_CAPSULE | Freq: Every day | ORAL | 0 refills | Status: DC

## 2021-09-25 ENCOUNTER — Other Ambulatory Visit: Payer: Self-pay | Admitting: Physician Assistant

## 2021-09-26 MED ORDER — LISDEXAMFETAMINE DIMESYLATE 20 MG PO CAPS: 20.0000 mg | ORAL_CAPSULE | Freq: Every day | ORAL | 0 refills | Status: DC

## 2021-10-01 DIAGNOSIS — J029 Acute pharyngitis, unspecified: Secondary | ICD-10-CM | POA: Diagnosis not present

## 2021-10-02 DIAGNOSIS — Z01419 Encounter for gynecological examination (general) (routine) without abnormal findings: Secondary | ICD-10-CM | POA: Diagnosis not present

## 2021-10-24 ENCOUNTER — Encounter: Payer: Self-pay | Admitting: Physician Assistant

## 2021-10-25 ENCOUNTER — Other Ambulatory Visit: Payer: Self-pay | Admitting: Physician Assistant

## 2021-10-25 MED ORDER — LISDEXAMFETAMINE DIMESYLATE 10 MG PO CAPS: 10.0000 mg | ORAL_CAPSULE | Freq: Every day | ORAL | 0 refills | Status: DC

## 2021-10-27 DIAGNOSIS — B349 Viral infection, unspecified: Secondary | ICD-10-CM | POA: Diagnosis not present

## 2021-10-27 DIAGNOSIS — R070 Pain in throat: Secondary | ICD-10-CM | POA: Diagnosis not present

## 2021-11-08 ENCOUNTER — Encounter: Payer: Self-pay | Admitting: Physician Assistant

## 2021-11-08 ENCOUNTER — Ambulatory Visit: Payer: Medicaid Other | Admitting: Physician Assistant

## 2021-11-08 VITALS — BP 102/70 | HR 72 | Temp 97.8°F | Ht 66.0 in | Wt 115.4 lb

## 2021-11-08 DIAGNOSIS — J029 Acute pharyngitis, unspecified: Secondary | ICD-10-CM

## 2021-11-08 LAB — POCT RAPID STREP A (OFFICE): Rapid Strep A Screen: POSITIVE — AB

## 2021-11-08 LAB — POC COVID19 BINAXNOW: SARS Coronavirus 2 Ag: NEGATIVE

## 2021-11-08 MED ORDER — AMOXICILLIN 875 MG PO TABS: 875.0000 mg | ORAL_TABLET | Freq: Two times a day (BID) | ORAL | 0 refills | Status: AC

## 2021-11-08 NOTE — Progress Notes (Signed)
Mary Cochran is a 26 y.o. female here for a follow up of a pre-existing problem.  History of Present Illness:   Chief Complaint  Patient presents with   Sore Throat    Pt c/o sore throat x 2 days, cough and body aches yesterday. Denies fever or chills.    Sore Throat     Sore throat Went to urgent care on 10/27/21 -- had negative strep test at that time. Had sore throat and ear pain. Kids have a slight cough currently. Strep going around at daycare/school. Martin Majestic out of town in [place] this past weekend and could have had exposures at that time.  Took: zicam, ibuprofen  Denies: fevers, chills, n/v/d, concerns for pregnancy  Past Medical History:  Diagnosis Date   ALLERGIC RHINITIS 12/12/2006   Allergy    SEASONAL   Anxiety    Depression 10/03/2011   GILBERT'S SYNDROME 03/07/2010   Headache    Migraines   IBS (irritable bowel syndrome) 10/04/2015   INSOMNIA-SLEEP DISORDER-UNSPEC 04/18/2007   Tubular adenoma of colon 10/2014     Social History   Tobacco Use   Smoking status: Never   Smokeless tobacco: Never  Vaping Use   Vaping Use: Never used  Substance Use Topics   Alcohol use: Yes    Alcohol/week: 0.0 standard drinks of alcohol    Comment: OCC.   Drug use: No    Past Surgical History:  Procedure Laterality Date   COLONOSCOPY  2016   MOUTH SURGERY     TONSILLECTOMY      Family History  Problem Relation Age of Onset   Breast cancer Maternal Grandmother    Heart disease Maternal Grandmother    Miscarriages / Stillbirths Maternal Grandmother    Pancreatic cancer Other    Kidney cancer Maternal Aunt    Heart disease Father    Heart disease Maternal Grandfather    Varicose Veins Maternal Grandfather    Varicose Veins Mother    Colon polyps Mother    Colon cancer Neg Hx    Esophageal cancer Neg Hx    Rectal cancer Neg Hx    Stomach cancer Neg Hx     Allergies  Allergen Reactions   Azithromycin Other (See Comments)    Severe stomach ache    Hydrocodone Other (See Comments)    GI upset   Morphine     Unknown, childhood reaction   Ciprofloxacin Hcl Hives    Current Medications:   Current Outpatient Medications:    hydrOXYzine (ATARAX) 10 MG tablet, Take 1 tablet (10 mg total) by mouth 3 (three) times daily as needed., Disp: 30 tablet, Rfl: 0   lisdexamfetamine (VYVANSE) 10 MG capsule, Take 1 capsule (10 mg total) by mouth daily., Disp: 30 capsule, Rfl: 0   Magnesium 400 MG CAPS, Take 1 capsule by mouth daily in the afternoon., Disp: , Rfl:    Multiple Vitamin (MULTIVITAMIN PO), Take by mouth., Disp: , Rfl:    Omega-3 Fatty Acids (FISH OIL) 1000 MG CAPS, Take 2 capsules by mouth daily in the afternoon., Disp: , Rfl:    ondansetron (ZOFRAN-ODT) 4 MG disintegrating tablet, Take 4 mg by mouth every 8 (eight) hours as needed., Disp: , Rfl:    paragard intrauterine copper IUD IUD, by Intrauterine route., Disp: , Rfl:    Probiotic Product (PROBIOTIC PO), Take by mouth., Disp: , Rfl:    Saw Palmetto 160 MG CAPS, Take by mouth., Disp: , Rfl:    sertraline (ZOLOFT) 50 MG  tablet, Take 1 tablet (50 mg total) by mouth at bedtime., Disp: 90 tablet, Rfl: 3   Ubrogepant (UBRELVY) 100 MG TABS, Take 1 tablet by mouth as needed (May repeat in 2 hours.  Maximum 2 tablets in 24 hours)., Disp: 10 tablet, Rfl: 11   Review of Systems:   ROS Negative unless otherwise specified per HPI.  Vitals:   Vitals:   11/08/21 1035  BP: 102/70  Pulse: 72  Temp: 97.8 F (36.6 C)  TempSrc: Temporal  SpO2: 98%  Weight: 115 lb 6.1 oz (52.3 kg)  Height: '5\' 6"'$  (1.676 m)     Body mass index is 18.62 kg/m.  Physical Exam:   Physical Exam Vitals and nursing note reviewed.  Constitutional:      General: She is not in acute distress.    Appearance: She is well-developed. She is not ill-appearing or toxic-appearing.  HENT:     Mouth/Throat:     Pharynx: Posterior oropharyngeal erythema present.     Tonsils: No tonsillar exudate.  Cardiovascular:      Rate and Rhythm: Normal rate and regular rhythm.     Pulses: Normal pulses.     Heart sounds: Normal heart sounds, S1 normal and S2 normal.  Pulmonary:     Effort: Pulmonary effort is normal.     Breath sounds: Normal breath sounds.  Skin:    General: Skin is warm and dry.  Neurological:     Mental Status: She is alert.     GCS: GCS eye subscore is 4. GCS verbal subscore is 5. GCS motor subscore is 6.  Psychiatric:        Speech: Speech normal.        Behavior: Behavior normal. Behavior is cooperative.    Results for orders placed or performed in visit on 11/08/21  POCT rapid strep A  Result Value Ref Range   Rapid Strep A Screen Positive (A) Negative  POC COVID-19  Result Value Ref Range   SARS Coronavirus 2 Ag Negative Negative     Assessment and Plan:   Sore throat COVID test negative Strep test positive Start amoxicillin 875 mg BID x 10 days Follow-up if new/worsening sx NSAIDs for pain   [name], PA-C

## 2021-11-12 ENCOUNTER — Encounter: Payer: Self-pay | Admitting: Physician Assistant

## 2021-12-02 ENCOUNTER — Other Ambulatory Visit: Payer: Self-pay | Admitting: Physician Assistant

## 2021-12-04 MED ORDER — LISDEXAMFETAMINE DIMESYLATE 10 MG PO CAPS: 10.0000 mg | ORAL_CAPSULE | Freq: Every day | ORAL | 0 refills | Status: DC

## 2021-12-18 ENCOUNTER — Encounter: Payer: Self-pay | Admitting: Physician Assistant

## 2021-12-26 ENCOUNTER — Encounter: Payer: Self-pay | Admitting: Physician Assistant

## 2022-01-01 ENCOUNTER — Ambulatory Visit (INDEPENDENT_AMBULATORY_CARE_PROVIDER_SITE_OTHER): Payer: Medicaid Other | Admitting: Family Medicine

## 2022-01-01 VITALS — BP 112/76 | HR 82 | Wt 114.0 lb

## 2022-01-01 DIAGNOSIS — G44321 Chronic post-traumatic headache, intractable: Secondary | ICD-10-CM

## 2022-01-01 DIAGNOSIS — F32A Depression, unspecified: Secondary | ICD-10-CM | POA: Diagnosis not present

## 2022-01-01 DIAGNOSIS — S060X0A Concussion without loss of consciousness, initial encounter: Secondary | ICD-10-CM

## 2022-01-01 DIAGNOSIS — F419 Anxiety disorder, unspecified: Secondary | ICD-10-CM

## 2022-01-01 DIAGNOSIS — R41840 Attention and concentration deficit: Secondary | ICD-10-CM | POA: Diagnosis not present

## 2022-01-01 MED ORDER — NORTRIPTYLINE HCL 25 MG PO CAPS: 25.0000 mg | ORAL_CAPSULE | Freq: Every day | ORAL | 2 refills | Status: DC
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
## 2022-01-01 NOTE — Patient Instructions (Signed)
Thank you for coming in today.   Continue chiropractor.   Add nortriptyline.   Consider neuro rehab  Recheck in 2 weeks.   Take it easy and listen to your body.   Marland Kitchen

## 2022-01-04 ENCOUNTER — Other Ambulatory Visit: Payer: Self-pay | Admitting: Physician Assistant

## 2022-01-04 ENCOUNTER — Encounter: Payer: Self-pay | Admitting: Physician Assistant

## 2022-01-04 MED ORDER — LISDEXAMFETAMINE DIMESYLATE 10 MG PO CAPS: 10.0000 mg | ORAL_CAPSULE | Freq: Every day | ORAL | 0 refills | Status: DC

## 2022-01-04 NOTE — Telephone Encounter (Signed)
Patient states: - [facility]ist at [facility] primary [facility] on [address] is refusing to refill her rx. States he informed her that they only have the generic version of the vyvanse   -[facility] [address] where the pharmacist states she can fill about half of her brand name vyvanse today if sent in   I informed patient that since PCP is out of office, it is not likely that the medication will be sent in today. Pt verbalized understanding.   Patient requests: -[facility] on [address] from now on.

## 2022-01-04 NOTE — Telephone Encounter (Signed)
[facility] spoke to pharmacist Tiffany, she said pt wants Brand only and Rx needs to be changed and say [id number]. Told her okay, the provider has left for the day will send new Rx over tomorrow. Tiffany verbalized understanding.

## 2022-01-04 NOTE — Telephone Encounter (Signed)
Pt requesting refill for Vyvanse. Last OV 11/2021.

## 2022-01-05 MED ORDER — VYVANSE 10 MG PO CAPS: 10.0000 mg | ORAL_CAPSULE | Freq: Every day | ORAL | 0 refills | Status: DC

## 2022-01-05 NOTE — Telephone Encounter (Signed)
Please send New Rx for V[facility]vanse 10 mg [facility] to [facility] [address]. I have loaded the cart and changed pharmacy.

## 2022-01-05 NOTE — Addendum Note (Signed)
Addended by: Marian Sorrow on: 01/05/2022 08:23 AM   Modules accepted: Orders
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
## 2022-01-15 ENCOUNTER — Encounter: Payer: Medicaid Other | Admitting: Family Medicine

## 2022-01-16 ENCOUNTER — Encounter: Payer: Self-pay | Admitting: Physician Assistant

## 2022-01-16 MED ORDER — VYVANSE 10 MG PO CAPS: 10.0000 mg | ORAL_CAPSULE | Freq: Every day | ORAL | 0 refills | Status: DC

## 2022-01-24 ENCOUNTER — Other Ambulatory Visit: Payer: Self-pay | Admitting: Physician Assistant

## 2022-02-21 MED ORDER — VYVANSE 10 MG PO CAPS: 10.0000 mg | ORAL_CAPSULE | Freq: Every day | ORAL | 0 refills | Status: DC

## 2022-03-20 ENCOUNTER — Encounter: Payer: Self-pay | Admitting: Physician Assistant

## 2022-03-21 ENCOUNTER — Other Ambulatory Visit: Payer: Self-pay | Admitting: Physician Assistant

## 2022-03-27 MED ORDER — VYVANSE 10 MG PO CAPS: 10.0000 mg | ORAL_CAPSULE | Freq: Every day | ORAL | 0 refills | Status: DC

## 2022-03-29 ENCOUNTER — Encounter: Payer: Self-pay | Admitting: Physician Assistant

## 2022-03-29 ENCOUNTER — Ambulatory Visit (INDEPENDENT_AMBULATORY_CARE_PROVIDER_SITE_OTHER): Payer: Medicaid Other | Admitting: Physician Assistant

## 2022-03-29 VITALS — BP 98/70 | HR 73 | Temp 97.7°F | Ht 66.0 in | Wt 112.2 lb

## 2022-03-29 DIAGNOSIS — F419 Anxiety disorder, unspecified: Secondary | ICD-10-CM

## 2022-03-29 DIAGNOSIS — R41840 Attention and concentration deficit: Secondary | ICD-10-CM | POA: Diagnosis not present

## 2022-03-29 DIAGNOSIS — F32A Depression, unspecified: Secondary | ICD-10-CM | POA: Diagnosis not present

## 2022-03-29 MED ORDER — LISDEXAMFETAMINE DIMESYLATE 10 MG PO CHEW: 1.0000 | CHEWABLE_TABLET | Freq: Every day | ORAL | 0 refills | Status: DC

## 2022-03-29 MED ORDER — LISDEXAMFETAMINE DIMESYLATE 10 MG PO CHEW: 10.0000 mg | CHEWABLE_TABLET | Freq: Every day | ORAL | 0 refills | Status: DC

## 2022-03-29 NOTE — Progress Notes (Signed)
Mary Cochran is a 27 y.o. female here for a follow up of a pre-existing problem.  History of Present Illness:   Chief Complaint  Patient presents with   ADHD    Pt would like to discuss medication.    HPI  ADHD; Anxiety Patient reports that her 10 mg vyvanse exacerbates her anxiety, but does help with her ADHD. She states that she does not feel a difference with her increased dosage of 50 mg zoloft. She expresses that she didn't take her vyvanse for one day and she was able to function fine. She thinks this is because the medicine was watered down in her system. On the second day of not taking it, she reported feeling sleepy and "vegetable like". Patient reports that when speaking to her therapist, she mentioned being interested in a non-nonstimulant medication. She states that she never took 25 mg pamelor. Denies SI/HI       Past Medical History:  Diagnosis Date   ALLERGIC RHINITIS 12/12/2006   Allergy    SEASONAL   Anxiety    Depression 10/03/2011   GILBERT'S SYNDROME 03/07/2010   Headache    Migraines   IBS (irritable bowel syndrome) 10/04/2015   INSOMNIA-SLEEP DISORDER-UNSPEC 04/18/2007   Tubular adenoma of colon 10/2014     Social History   Tobacco Use   Smoking status: Never   Smokeless tobacco: Never  Vaping Use   Vaping Use: Never used  Substance Use Topics   Alcohol use: Yes    Alcohol/week: 0.0 standard drinks of alcohol    Comment: OCC.   Drug use: No    Past Surgical History:  Procedure Laterality Date   COLONOSCOPY  2016   MOUTH SURGERY     TONSILLECTOMY      Family History  Problem Relation Age of Onset   Breast cancer Maternal Grandmother    Heart disease Maternal Grandmother    Miscarriages / Stillbirths Maternal Grandmother    Pancreatic cancer Other    Kidney cancer Maternal Aunt    Heart disease Father    Heart disease Maternal Grandfather    Varicose Veins Maternal Grandfather    Varicose Veins Mother    Colon polyps Mother     Colon cancer Neg Hx    Esophageal cancer Neg Hx    Rectal cancer Neg Hx    Stomach cancer Neg Hx     Allergies  Allergen Reactions   Azithromycin Other (See Comments)    Severe stomach ache   Hydrocodone Other (See Comments)    GI upset   Morphine     Unknown, childhood reaction   Ciprofloxacin Hcl Hives    Current Medications:   Current Outpatient Medications:    [START ON 05/28/2022] Lisdexamfetamine Dimesylate (VYVANSE) 10 MG CHEW, Chew 1 tablet (10 mg total) by mouth daily., Disp: 30 tablet, Rfl: 0   Lisdexamfetamine Dimesylate (VYVANSE) 10 MG CHEW, Chew 1 tablet (10 mg total) by mouth daily at 6 (six) AM., Disp: 30 tablet, Rfl: 0   [START ON 04/28/2022] Lisdexamfetamine Dimesylate (VYVANSE) 10 MG CHEW, Chew 1 tablet (10 mg total) by mouth daily at 6 (six) AM., Disp: 30 tablet, Rfl: 0   Magnesium 400 MG CAPS, Take 1 capsule by mouth daily in the afternoon., Disp: , Rfl:    Multiple Vitamin (MULTIVITAMIN PO), Take by mouth., Disp: , Rfl:    Omega-3 Fatty Acids (FISH OIL) 1000 MG CAPS, Take 2 capsules by mouth daily in the afternoon., Disp: , Rfl:  paragard intrauterine copper IUD IUD, by Intrauterine route., Disp: , Rfl:    Probiotic Product (PROBIOTIC PO), Take by mouth., Disp: , Rfl:    sertraline (ZOLOFT) 50 MG tablet, Take 1 tablet (50 mg total) by mouth at bedtime., Disp: 90 tablet, Rfl: 3   spironolactone (ALDACTONE) 100 MG tablet, Take 100 mg by mouth daily., Disp: , Rfl:    Ubrogepant (UBRELVY) 100 MG TABS, Take 1 tablet by mouth as needed (May repeat in 2 hours.  Maximum 2 tablets in 24 hours)., Disp: 10 tablet, Rfl: 11   Review of Systems:   ROS Negative unless otherwise specified per HPI.   Vitals:   Vitals:   03/29/22 0948  BP: 98/70  Pulse: 73  Temp: 97.7 F (36.5 C)  TempSrc: Temporal  SpO2: 99%  Weight: 112 lb 4 oz (50.9 kg)  Height: 5' 6"$  (1.676 m)     Body mass index is 18.12 kg/m.  Physical Exam:   Physical Exam Constitutional:       General: She is not in acute distress.    Appearance: Normal appearance. She is not ill-appearing.  HENT:     Head: Normocephalic and atraumatic.     Right Ear: External ear normal.     Left Ear: External ear normal.  Eyes:     Extraocular Movements: Extraocular movements intact.     Pupils: Pupils are equal, round, and reactive to light.  Cardiovascular:     Rate and Rhythm: Normal rate and regular rhythm.     Heart sounds: Normal heart sounds. No murmur heard.    No gallop.  Pulmonary:     Effort: Pulmonary effort is normal. No respiratory distress.     Breath sounds: Normal breath sounds. No wheezing or rales.  Skin:    General: Skin is warm and dry.  Neurological:     Mental Status: She is alert and oriented to person, place, and time.  Psychiatric:        Judgment: Judgment normal.     Assessment and Plan:   Anxiety; Depression, unspecified depression type No red flags on exam Worsening exacerbations of anxiety likely due to vyvanse Continue zoloft 50 mg daily Trial vyvanse change as below  Attention and concentration deficit Uncontrolled Change vyvanse to 10 mg chew -- split in half and see if this helps symptoms If not helpful, may trial change to Adderall XR 5 mg Follow-up in 6 months, sooner if concerns  I,Verona Buck,acting as a scribe for [employer], PA.,have documented all relevant documentation on the behalf of [name], PA,as directed by  [name], PA while in the presence of [name], [place].  I, [name], [place], have reviewed all documentation for this visit. The documentation on 03/29/22 for the exam, diagnosis, procedures, and orders are all accurate and complete.   [name], PA-C

## 2022-04-02 NOTE — Telephone Encounter (Signed)
Called [facility] and spoke to Livengood, told her calling about pt's Rx for Vyvanse 10 mg chew, pt said it was on hold. Caryl Pina said is there a reason pt can't take capsule? Told her no. She said pt picked up Rx on 2/21 Vyvanse capsule. Told her okay, I guess you got Rx in.

## 2022-04-03 ENCOUNTER — Encounter: Payer: Self-pay | Admitting: Physician Assistant

## 2022-04-03 ENCOUNTER — Other Ambulatory Visit: Payer: Self-pay | Admitting: Physician Assistant

## 2022-04-03 MED ORDER — LISDEXAMFETAMINE DIMESYLATE 10 MG PO CHEW: CHEWABLE_TABLET | ORAL | 0 refills | Status: DC

## 2022-04-03 NOTE — Telephone Encounter (Signed)
Pt called in to confirm her previous msg.

## 2022-04-04 DIAGNOSIS — H1045 Other chronic allergic conjunctivitis: Secondary | ICD-10-CM | POA: Diagnosis not present

## 2022-05-15 ENCOUNTER — Other Ambulatory Visit: Payer: Self-pay | Admitting: Physician Assistant

## 2022-05-15 MED ORDER — LISDEXAMFETAMINE DIMESYLATE 10 MG PO CHEW: CHEWABLE_TABLET | ORAL | 0 refills | Status: DC

## 2022-05-15 NOTE — Telephone Encounter (Signed)
Last refill: 03/14/22 #30, 0, patient states she needs #15 Last OV: 03/29/22 dx. Anxiety, ADHD

## 2022-05-15 NOTE — Telephone Encounter (Signed)
Refuse Rx was already sent.

## 2022-05-29 ENCOUNTER — Encounter: Payer: Self-pay | Admitting: Physician Assistant

## 2022-05-29 MED ORDER — LISDEXAMFETAMINE DIMESYLATE 10 MG PO CHEW: 10.0000 mg | CHEWABLE_TABLET | Freq: Every day | ORAL | 0 refills | Status: DC

## 2022-05-29 NOTE — Addendum Note (Signed)
Addended by: Jimmye Norman on: 05/29/2022 12:23 PM   Modules accepted: Orders

## 2022-06-06 ENCOUNTER — Encounter: Payer: Self-pay | Admitting: Physician Assistant

## 2022-06-06 DIAGNOSIS — R229 Localized swelling, mass and lump, unspecified: Secondary | ICD-10-CM

## 2022-06-11 ENCOUNTER — Encounter: Payer: Self-pay | Admitting: Physician Assistant

## 2022-06-11 ENCOUNTER — Ambulatory Visit: Payer: Medicaid Other | Admitting: Physician Assistant

## 2022-06-11 VITALS — BP 110/60 | HR 62 | Temp 98.0°F | Ht 66.0 in | Wt 112.5 lb

## 2022-06-11 DIAGNOSIS — R229 Localized swelling, mass and lump, unspecified: Secondary | ICD-10-CM

## 2022-06-11 NOTE — Patient Instructions (Signed)
It was great to see you!  Try to keep covered during the day with the bacitracin ointment and bandage  Otherwise, bathing to soak and express more out can be beneficial   Incision and Drainage, Care After After incision and drainage, it is common to have: Pain or discomfort around the incision site. Blood, fluid, or pus (drainage) from the incision. Redness and firm skin around the incision site. Follow these instructions at home: Medicines Take over-the-counter and prescription medicines only as told by your health care provider. If you were prescribed antibiotics, take them as told by your provider. Do not stop using the antibiotic even if you start to feel better. Do not apply creams, ointments, or liquids unless you have been told to by your provider. Wound care  Follow instructions from your provider about how to take care of your wound. Make sure you: Wash your hands with soap and water for at least 20 seconds before and after you change your bandage (dressing). If soap and water are not available, use hand sanitizer. Change your dressing and any packing as told by your provider. If the dressing is dry or stuck when you try to remove it, moisten or wet it with saline or water. This will help you remove it without harming your skin or tissues. If your wound is packed, leave it in place until your provider tells you to remove it. To remove it, moisten or wet the packing with saline or water. Leave stitches (sutures), skin glue, or tape strips in place. These skin closures may need to stay in place for 2 weeks or longer. If tape strip edges start to loosen and curl up, you may trim the loose edges. Do not remove tape strips completely unless your provider tells you to do that. Check your wound every day for signs of infection. Check for: More redness, swelling, or pain. More fluid or blood. Warmth. Pus or a bad smell. If you were sent home with a drain tube in place, follow  instructions from your provider about: How to empty it. How to care for it at home. Be careful when you get rid of used dressings, wound packing, or drainage. Activity Rest the affected area. Return to your normal activities as told by your provider. Ask your provider what activities are safe for you. General instructions Do not use any products that contain nicotine or tobacco. These products include cigarettes, chewing tobacco, and vaping devices, such as e-cigarettes. These can delay incision healing after surgery. If you need help quitting, ask your provider. Do not take baths, swim, or use a hot tub until your provider approves. Ask your provider if you may take showers. You may only be allowed to take sponge baths. The incision will keep draining. It is normal to have some clear or slightly bloody drainage. The amount of drainage should go down each day. Keep all follow-up visits. Your provider will need to make sure that your incision is healing well and that there are no problems. Your health care provider may give you more instructions. Make sure you know what you can and cannot do Contact a health care provider if: Your cyst or abscess comes back. You have any signs of infection. You notice red streaks that spread away from the incision site. You have a fever or chills. Get help right away if: You have severe pain or bleeding. You become short of breath. You have chest pain. You have signs of a severe infection. You may  notice changes in your incision area, such as: Swelling that makes the skin feel hard. Numbness or tingling. Sudden increase in redness. Your skin color may change from red to purple, and then to dark spots. Blisters, ulcers, or splitting of the skin. These symptoms may be an emergency. Get help right away. Call 911. Do not wait to see if the symptoms will go away. Do not drive yourself to the hospital. This information is not intended to replace advice given  to you by your health care provider. Make sure you discuss any questions you have with your health care provider.    Take care,  Jarold Motto PA-C

## 2022-06-11 NOTE — Progress Notes (Signed)
Mary Cochran is a 27 y.o. female here for a new problem.  History of Present Illness:   Chief Complaint  Patient presents with   Recurrent Skin Infections    Pt c/o boil right groin area comes and goes, but not fully go away, past weekend was huge and painful.    HPI  Patient reports an area of skin irritation to right groin x a few months Area has worsened Denies: new soaps, detergents, grooming products Has tried warm compresses and gentle attempts at expression  Past Medical History:  Diagnosis Date   ALLERGIC RHINITIS 12/12/2006   Allergy    SEASONAL   Anxiety    Depression 10/03/2011   GILBERT'S SYNDROME 03/07/2010   Headache    Migraines   IBS (irritable bowel syndrome) 10/04/2015   INSOMNIA-SLEEP DISORDER-UNSPEC 04/18/2007   Tubular adenoma of colon 10/2014     Social History   Tobacco Use   Smoking status: Never   Smokeless tobacco: Never  Vaping Use   Vaping Use: Never used  Substance Use Topics   Alcohol use: Yes    Alcohol/week: 0.0 standard drinks of alcohol    Comment: OCC.   Drug use: No    Past Surgical History:  Procedure Laterality Date   COLONOSCOPY  2016   MOUTH SURGERY     TONSILLECTOMY      Family History  Problem Relation Age of Onset   Breast cancer Maternal Grandmother    Heart disease Maternal Grandmother    Miscarriages / Stillbirths Maternal Grandmother    Pancreatic cancer Other    Kidney cancer Maternal Aunt    Heart disease Father    Heart disease Maternal Grandfather    Varicose Veins Maternal Grandfather    Varicose Veins Mother    Colon polyps Mother    Colon cancer Neg Hx    Esophageal cancer Neg Hx    Rectal cancer Neg Hx    Stomach cancer Neg Hx     Allergies  Allergen Reactions   Azithromycin Other (See Comments)    Severe stomach ache   Hydrocodone Other (See Comments)    GI upset   Morphine     Unknown, childhood reaction   Ciprofloxacin Hcl Hives    Current Medications:   Current Outpatient  Medications:    Lisdexamfetamine Dimesylate (VYVANSE) 10 MG CHEW, Chew 1 tablet (10 mg total) by mouth daily in the afternoon., Disp: 30 tablet, Rfl: 0   Magnesium 400 MG CAPS, Take 1 capsule by mouth daily in the afternoon., Disp: , Rfl:    Multiple Vitamin (MULTIVITAMIN PO), Take by mouth., Disp: , Rfl:    Omega-3 Fatty Acids (FISH OIL) 1000 MG CAPS, Take 2 capsules by mouth daily in the afternoon., Disp: , Rfl:    paragard intrauterine copper IUD IUD, by Intrauterine route., Disp: , Rfl:    Probiotic Product (PROBIOTIC PO), Take by mouth., Disp: , Rfl:    sertraline (ZOLOFT) 50 MG tablet, Take 1 tablet (50 mg total) by mouth at bedtime., Disp: 90 tablet, Rfl: 3   spironolactone (ALDACTONE) 100 MG tablet, Take 100 mg by mouth daily., Disp: , Rfl:    Ubrogepant (UBRELVY) 100 MG TABS, Take 1 tablet by mouth as needed (May repeat in 2 hours.  Maximum 2 tablets in 24 hours)., Disp: 10 tablet, Rfl: 11   Review of Systems:   ROS Negative unless otherwise specified per HPI.  Vitals:   Vitals:   06/11/22 0837  BP: 110/60  Pulse:  62  Temp: 98 F (36.7 C)  TempSrc: Temporal  SpO2: 100%  Weight: 112 lb 8 oz (51 kg)  Height: 5\' 6"  (1.676 m)     Body mass index is 18.16 kg/m.  Physical Exam:   Physical Exam Vitals and nursing note reviewed.  Constitutional:      General: She is not in acute distress.    Appearance: She is well-developed. She is not ill-appearing or toxic-appearing.  Cardiovascular:     Rate and Rhythm: Normal rate and regular rhythm.     Pulses: Normal pulses.     Heart sounds: Normal heart sounds, S1 normal and S2 normal.  Pulmonary:     Effort: Pulmonary effort is normal.     Breath sounds: Normal breath sounds.  Genitourinary:    Comments: Right groin with approximately 1.5 cm area of erythema and induration; no fluctuance noted; no warmth; TTP Skin:    General: Skin is warm and dry.  Neurological:     Mental Status: She is alert.     GCS: GCS eye subscore  is 4. GCS verbal subscore is 5. GCS motor subscore is 6.  Psychiatric:        Speech: Speech normal.        Behavior: Behavior normal. Behavior is cooperative.    Procedure: I&D  Consent: Risks and benefits of therapy discussed with patient who voices understanding and agrees with planned care. No barriers to communication or understanding identified.  After obtaining informed consent, the patient's identity, procedure, and site were verified during a pause prior to proceeding with the minor surgical procedure as per universal protocol recommendations. Pt aware of risks not limited to but including infection, bleeding, damage to near by organs.  Meds, vitals, and allergies reviewed.  Indication: suspected abscess/folliculitis Pt complaints of: erythema, pain, swelling Location: right groin near hairline Size: 1.5 cm  Prep: etoh/betadine Anesthesia: 1%lidocaine with epi Incision made with #11 blade Wound explored and loculations removed -- serosanguinous fluid removed  Tolerated well without significant blood loss or obvious complications  Sterilized bandage applied to affected area   Assessment and Plan:   Skin nodule No red flags Suspected infected hair follicle vs abscess Aftercare discussed Recommend continued close monitoring and if any signs of infection, let us know   Aftercare, including wound care, risks of bleeding and infection were discussed. All questions answered.  Return for wound check as needed for further evaluation and management.    Jarold Motto, PA-C

## 2022-06-13 IMAGING — DX DG FOREARM 2V*R*
2 series · 2 of 2 positions shown · non-contrast
Comparison: None.

CLINICAL DATA: Right forearm pain for 2 days after iron trundle bed
fell on it.

EXAM:
RIGHT FOREARM - 2 VIEW

[forearm ap]
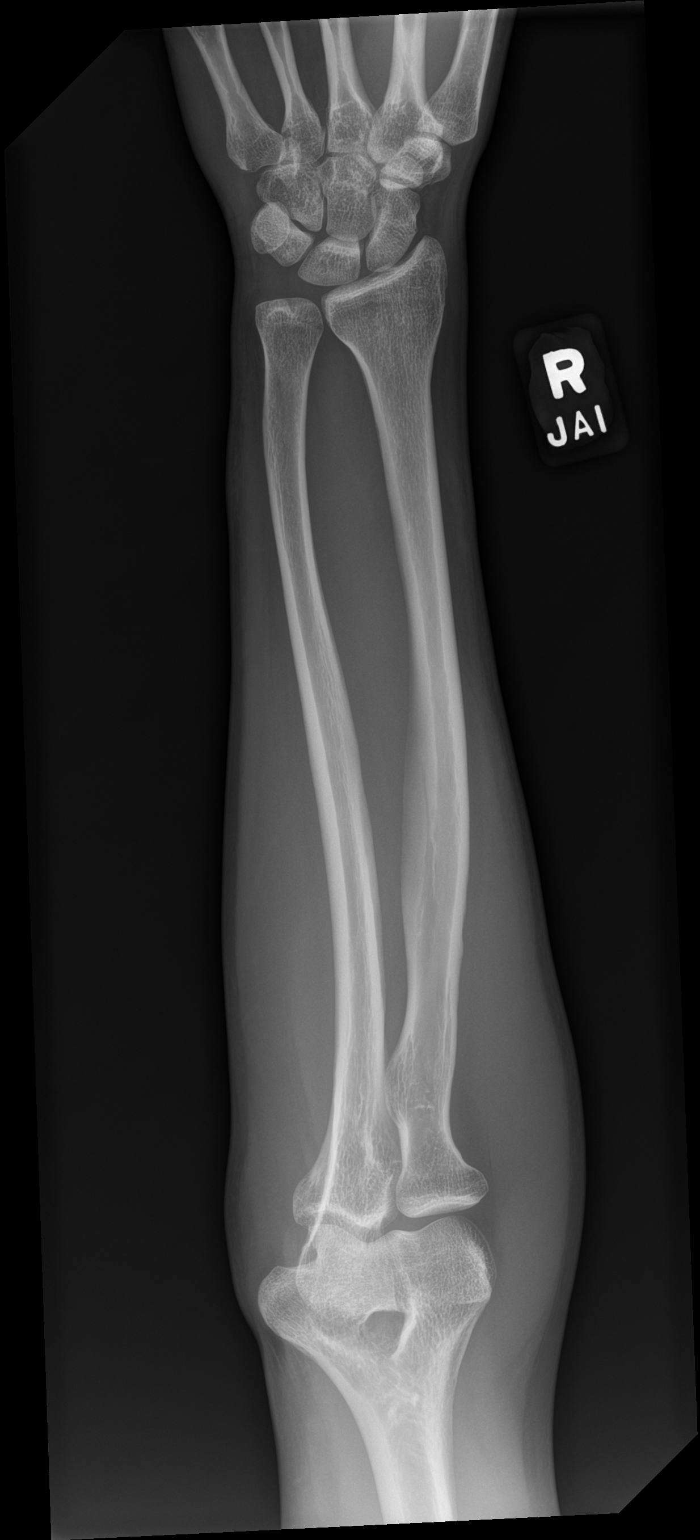

[forearm lat]
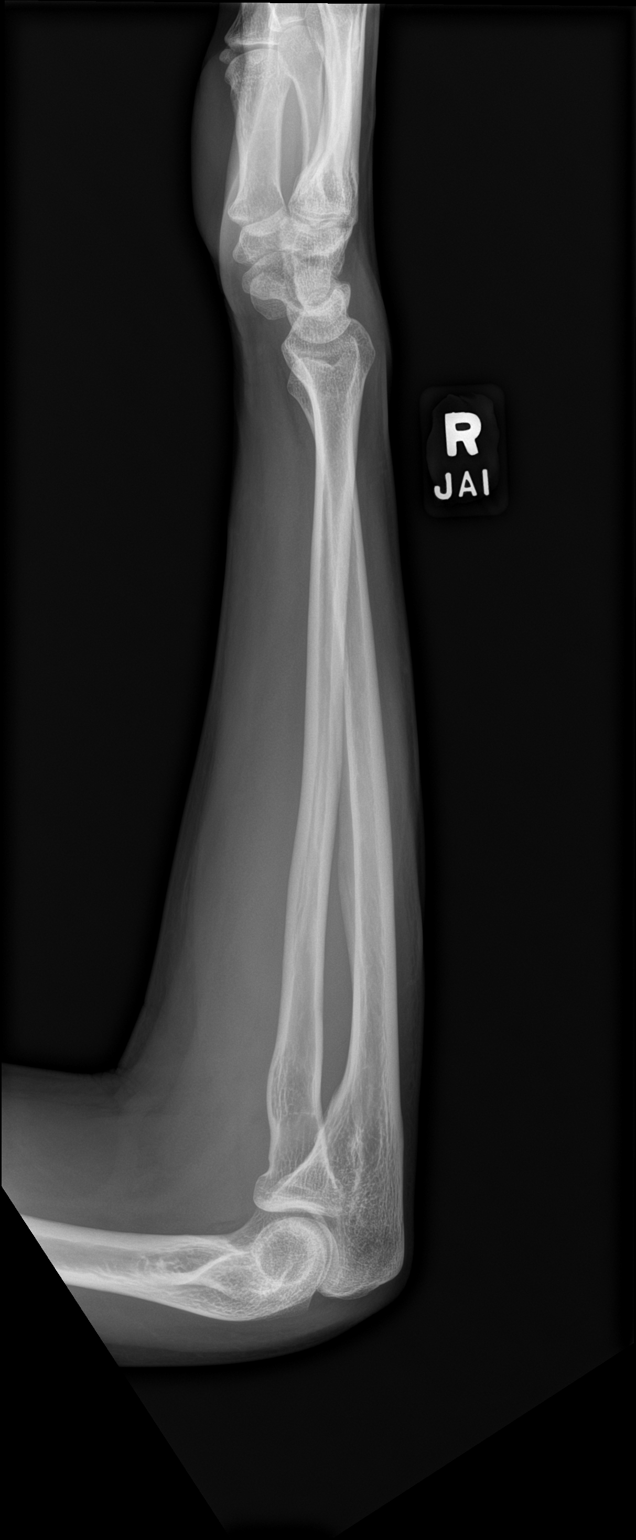

[2 of 2 positions shown; findings below may reference images not displayed]

FINDINGS: Cortical margins of the radius and ulna are intact. There is no
evidence of fracture or other focal bone lesions. Wrist and elbow
alignment are maintained. Soft tissues are unremarkable.
IMPRESSION: Negative radiographs of the right forearm.

## 2022-07-09 ENCOUNTER — Other Ambulatory Visit: Payer: Self-pay | Admitting: Physician Assistant

## 2022-07-11 MED ORDER — LISDEXAMFETAMINE DIMESYLATE 10 MG PO CHEW: 10.0000 mg | CHEWABLE_TABLET | Freq: Every day | ORAL | 0 refills | Status: DC

## 2022-07-11 NOTE — Addendum Note (Signed)
Addended by: Jimmye Norman on: 07/11/2022 07:45 AM   Modules accepted: Orders

## 2022-07-11 NOTE — Telephone Encounter (Signed)
Pt requesting refill for Vyvanse 10 mg chew. Last OV 06/11/2022

## 2022-07-23 DIAGNOSIS — J029 Acute pharyngitis, unspecified: Secondary | ICD-10-CM | POA: Diagnosis not present

## 2022-07-23 DIAGNOSIS — Z20818 Contact with and (suspected) exposure to other bacterial communicable diseases: Secondary | ICD-10-CM | POA: Diagnosis not present

## 2022-07-25 DIAGNOSIS — N925 Other specified irregular menstruation: Secondary | ICD-10-CM | POA: Diagnosis not present

## 2022-07-25 DIAGNOSIS — L709 Acne, unspecified: Secondary | ICD-10-CM | POA: Diagnosis not present

## 2022-07-25 DIAGNOSIS — N926 Irregular menstruation, unspecified: Secondary | ICD-10-CM | POA: Diagnosis not present

## 2022-07-25 DIAGNOSIS — L723 Sebaceous cyst: Secondary | ICD-10-CM | POA: Diagnosis not present

## 2022-07-25 DIAGNOSIS — Z3202 Encounter for pregnancy test, result negative: Secondary | ICD-10-CM | POA: Diagnosis not present

## 2022-08-07 ENCOUNTER — Other Ambulatory Visit: Payer: Self-pay | Admitting: Physician Assistant

## 2022-08-07 MED ORDER — LISDEXAMFETAMINE DIMESYLATE 10 MG PO CHEW: 10.0000 mg | CHEWABLE_TABLET | Freq: Every day | ORAL | 0 refills | Status: DC

## 2022-08-07 NOTE — Telephone Encounter (Signed)
Pt requesting refill for Vyvanse 10 mg Chew. Last OV 06/11/2022.

## 2022-08-08 ENCOUNTER — Encounter: Payer: Self-pay | Admitting: Physician Assistant

## 2022-08-08 MED ORDER — LISDEXAMFETAMINE DIMESYLATE 10 MG PO CHEW: 10.0000 mg | CHEWABLE_TABLET | Freq: Every day | ORAL | 0 refills | Status: DC

## 2022-08-08 NOTE — Telephone Encounter (Signed)
Mary Cochran, can you please send Vyvanse to pharmacy in cart. Pt is out of town. Thanks

## 2022-09-12 ENCOUNTER — Other Ambulatory Visit: Payer: Self-pay | Admitting: Physician Assistant

## 2022-09-12 DIAGNOSIS — H04123 Dry eye syndrome of bilateral lacrimal glands: Secondary | ICD-10-CM | POA: Diagnosis not present

## 2022-09-12 NOTE — Telephone Encounter (Signed)
Pt requesting refill for Vyvanse. Last OV 06/2022.

## 2022-09-13 MED ORDER — LISDEXAMFETAMINE DIMESYLATE 10 MG PO CHEW: 10.0000 mg | CHEWABLE_TABLET | Freq: Every day | ORAL | 0 refills | Status: DC

## 2022-10-09 IMAGING — DX DG RIBS W/ CHEST 3+V*L*
3 series · 3 of 3 positions shown · non-contrast
Comparison: May 28, 2016.

CLINICAL DATA: Left rib pain for 2 weeks.

EXAM:
LEFT RIBS AND CHEST - 3+ VIEW

[chest pa]
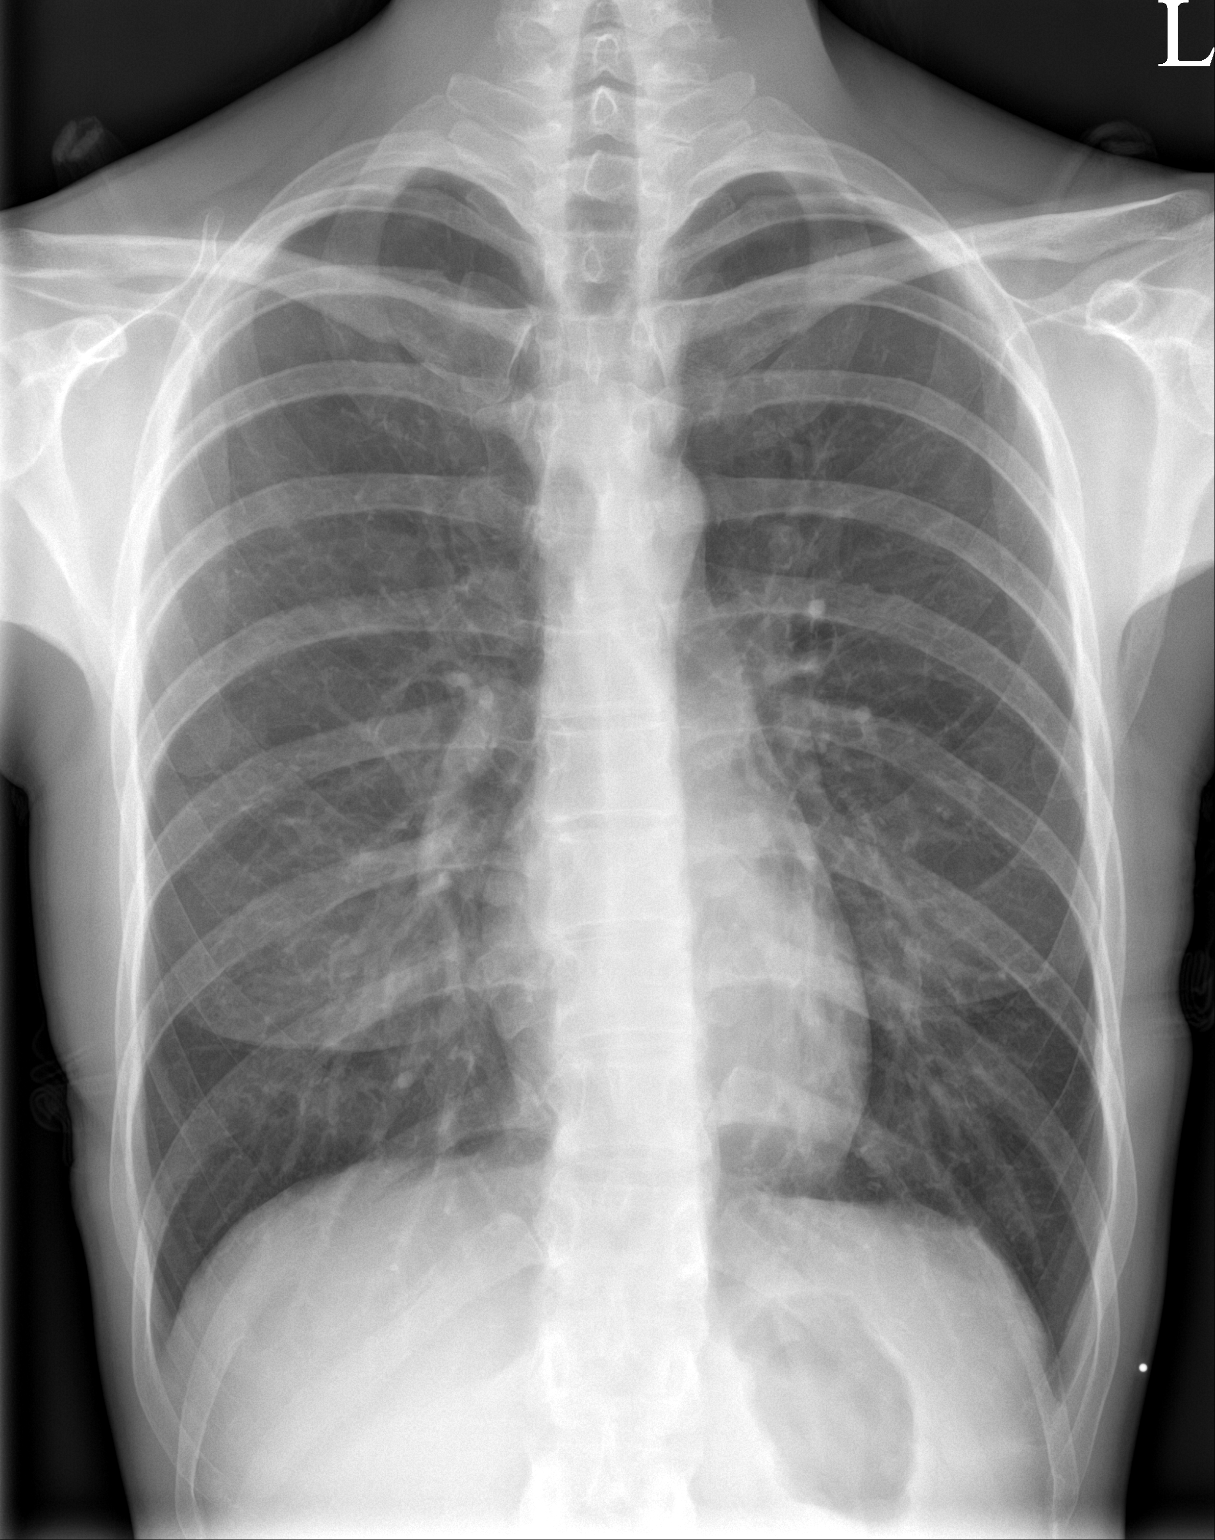

[rib ap]
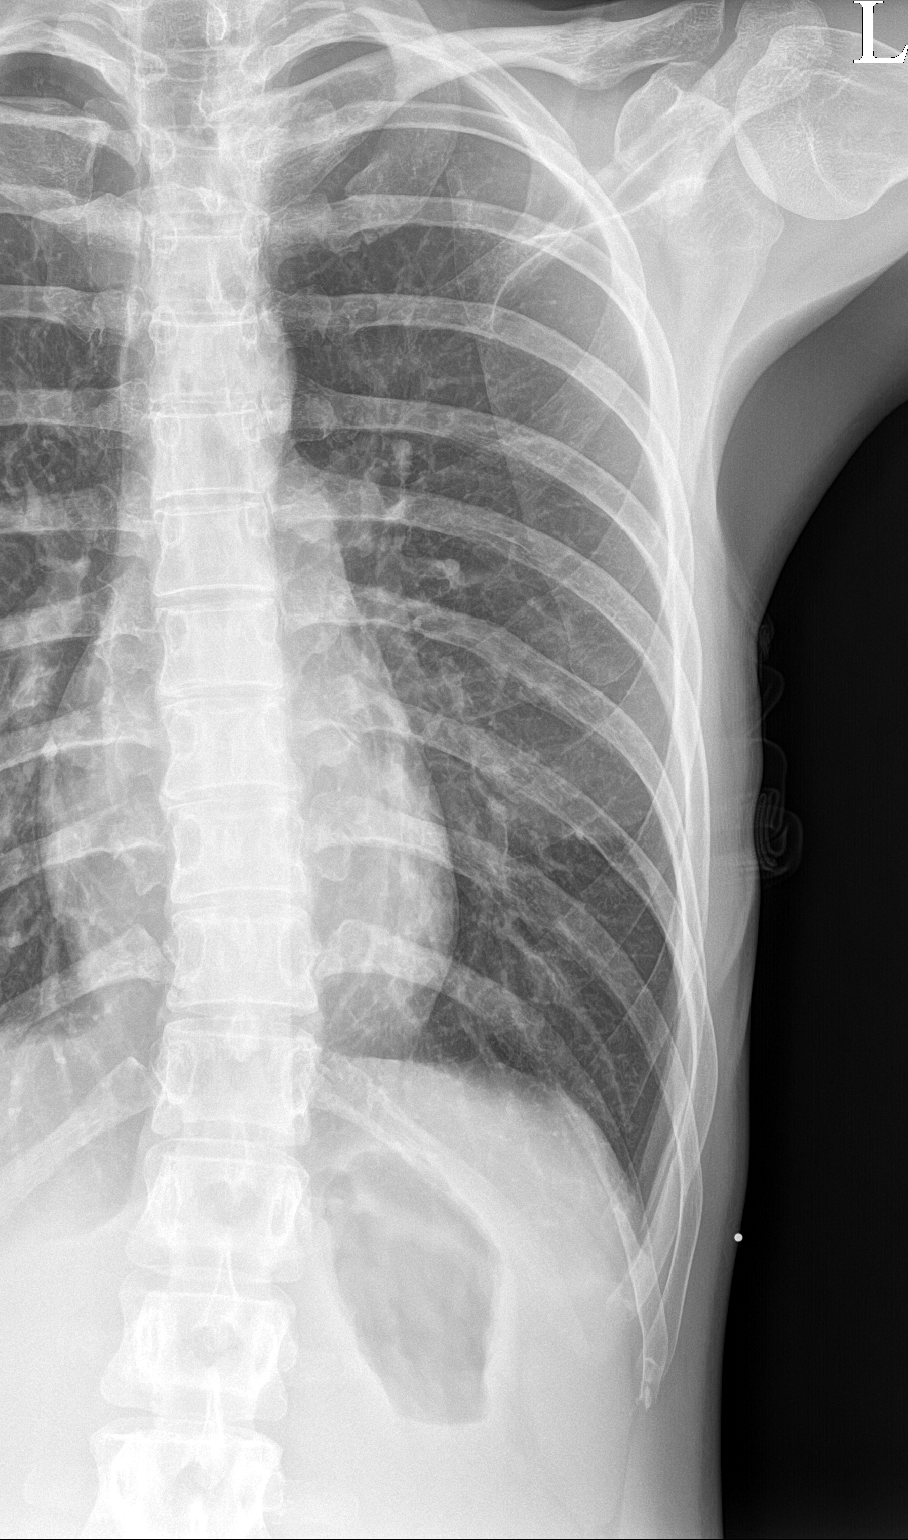

[rib obl]
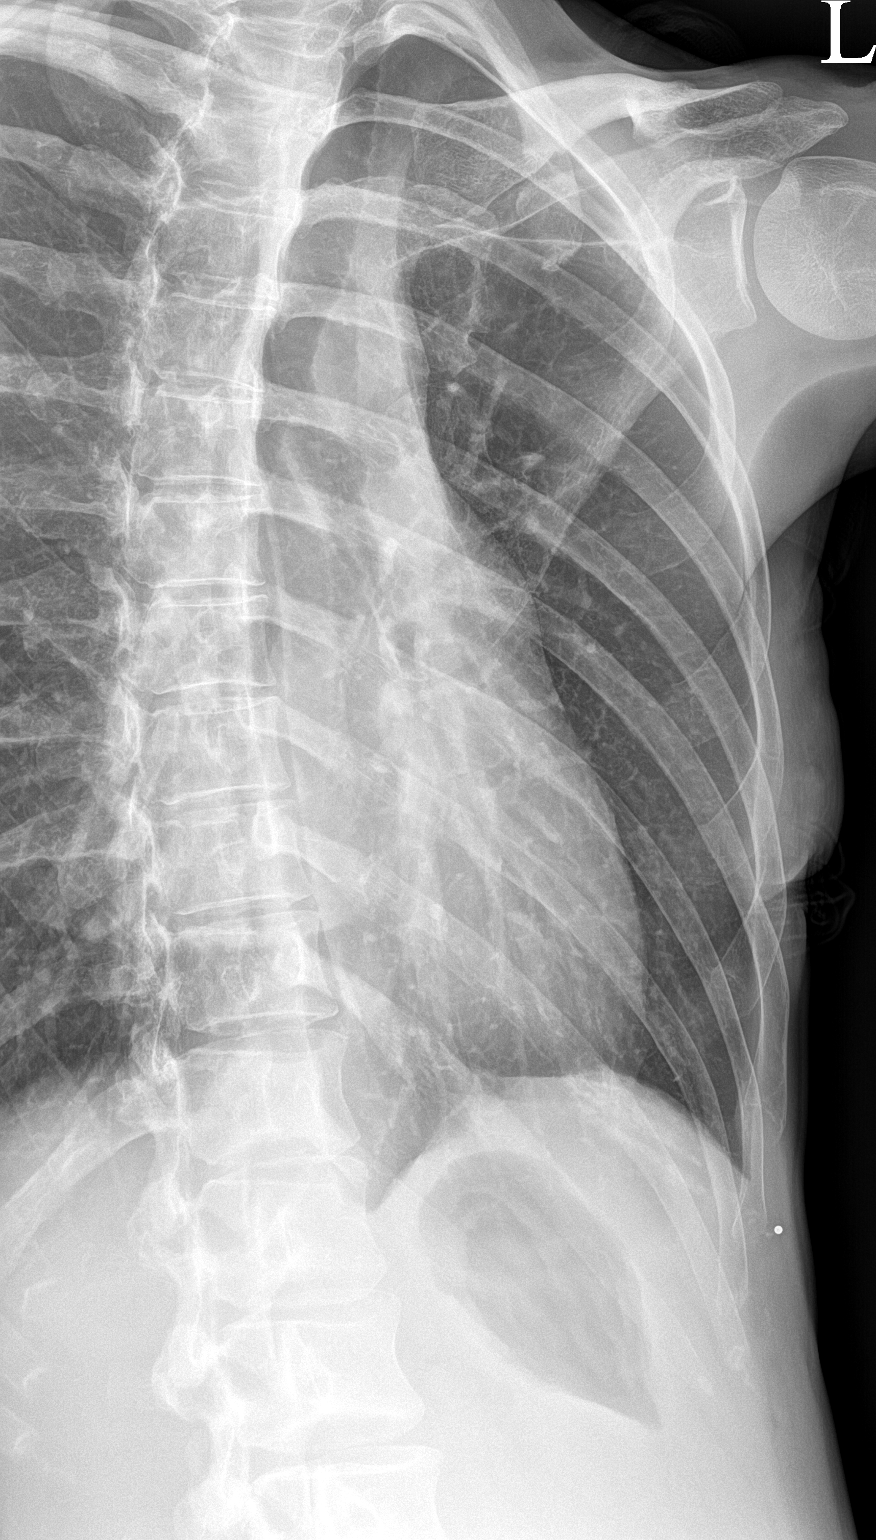

[3 of 3 positions shown; findings below may reference images not displayed]

FINDINGS: Nondisplaced fracture is seen involving lateral portion of left
ninth rib. There is no evidence of pneumothorax or pleural effusion.
Both lungs are clear. Heart size and mediastinal contours are within
normal limits.
IMPRESSION: Nondisplaced left ninth rib fracture.

## 2022-10-11 DIAGNOSIS — F3289 Other specified depressive episodes: Secondary | ICD-10-CM | POA: Diagnosis not present

## 2022-10-11 DIAGNOSIS — Z30432 Encounter for removal of intrauterine contraceptive device: Secondary | ICD-10-CM | POA: Diagnosis not present

## 2022-10-11 DIAGNOSIS — Z01419 Encounter for gynecological examination (general) (routine) without abnormal findings: Secondary | ICD-10-CM | POA: Diagnosis not present

## 2022-10-16 ENCOUNTER — Other Ambulatory Visit: Payer: Self-pay | Admitting: Physician Assistant

## 2022-10-16 MED ORDER — LISDEXAMFETAMINE DIMESYLATE 10 MG PO CHEW: 10.0000 mg | CHEWABLE_TABLET | Freq: Every day | ORAL | 0 refills | Status: DC

## 2022-10-16 NOTE — Telephone Encounter (Signed)
Pt requesting refill for Vyvanse 10 mg chew. Last OV 06/2022.

## 2022-10-24 ENCOUNTER — Other Ambulatory Visit: Payer: Self-pay | Admitting: Physician Assistant

## 2022-10-30 ENCOUNTER — Encounter: Payer: Self-pay | Admitting: Physician Assistant

## 2022-10-31 ENCOUNTER — Encounter: Payer: Self-pay | Admitting: Physician Assistant

## 2022-10-31 ENCOUNTER — Telehealth: Payer: Medicaid Other | Admitting: Physician Assistant

## 2022-10-31 VITALS — Ht 66.0 in | Wt 113.0 lb

## 2022-10-31 DIAGNOSIS — R41840 Attention and concentration deficit: Secondary | ICD-10-CM

## 2022-10-31 DIAGNOSIS — Z3A01 Less than 8 weeks gestation of pregnancy: Secondary | ICD-10-CM

## 2022-10-31 DIAGNOSIS — F419 Anxiety disorder, unspecified: Secondary | ICD-10-CM | POA: Diagnosis not present

## 2022-10-31 NOTE — Progress Notes (Signed)
I acted as a Neurosurgeon for [employer], PA-C Corky Mull, LPN  Virtual Visit via Video Note   I, Jarold Motto, PA, connected with  Mary Cochran  ([id number], 1996-01-21) on 10/31/22 at  9:00 AM EDT by a video-enabled telemedicine application and verified that I am speaking with the correct person using two identifiers.  Location: Patient: Home Provider: [place] [facility] [place] office   I discussed the limitations of evaluation and management by telemedicine and the availability of in person appointments. The patient expressed understanding and agreed to proceed.    History of Present Illness: Mary Cochran is a 27 y.o. who identifies as a female who was assigned female at birth, and is being seen today for medications/pregnancy. Pt is c/o brain zaps since stopping Vyvanse on 9/20. She wants to know how long it will last. After further discussion she does admit that she has missed some of her Zoloft recently - at least 2 days.  She has held her Vyvanse in the past on weekends without any significant withdrawal symptom(s) or brain zaps.  Denies suicidal ideation/hi.    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 3 capsules, Disp: , Rfl:    Prenatal Vit-Fe Fumarate-FA (PRENATAL MULTIVITAMIN) TABS tablet, Take 1 tablet by mouth daily at 12 noon., Disp: , Rfl:    Probiotic Product (PROBIOTIC PO), Take by mouth., Disp: , Rfl:    sertraline (ZOLOFT) 50 MG tablet, TAKE ONE TABLET BY MOUTH AT BEDTIME, Disp: 90 tablet, Rfl: 1   Lisdexamfetamine Dimesylate (VYVANSE) 10 MG CHEW, Chew 1 tablet (10 mg total) by mouth daily with breakfast. (Patient not taking: Reported on 10/31/2022), Disp: 30 tablet, Rfl: 0   spironolactone (ALDACTONE) 100 MG tablet, Take 100 mg by mouth daily. (Patient not taking: Reported on 10/31/2022), Disp: , Rfl:    Ubrogepant (UBRELVY) 100 MG TABS, Take 1 tablet by mouth as needed (May repeat in 2 hours.  Maximum 2 tablets in 24 hours). (Patient not taking: Reported on 10/31/2022), Disp: 10 tablet, Rfl: 11  Observations/Objective: Patient is well-developed, well-nourished in no acute distress.  Resting comfortably  at home.  Head is normocephalic, atraumatic.  No labored breathing.  Speech is clear and coherent with logical content.  Patient is alert and oriented at baseline.   Assessment and Plan:  Less than [redacted] weeks gestation of pregnancy Doing well overall Continue close follow-up with obstetrics for regular prenatal care  Attention and concentration deficit Uncontrolled due to inability to take medication however she is managing well despite this Continue to monitor symptom(s)   Anxiety Overall controlled Continue Zoloft 50 mg daily -- suspect that missing this for a few days is likely the cause of  her brain  zaps  If symptom(s) persist, reach out Otherwise, follow-up with [place] or OB in the next 3-6 months  Follow Up Instructions: I discussed the assessment and treatment plan with the patient. The patient was provided an opportunity to ask questions and all were answered. The patient agreed with the plan and demonstrated an understanding of the instructions.  A copy of instructions were sent to the patient via MyChart unless otherwise noted below.   The patient was advised to call back or seek an in-person evaluation if the symptoms worsen or if the condition fails to improve as anticipated.  Jarold Motto, [place]

## 2022-11-12 ENCOUNTER — Encounter (HOSPITAL_BASED_OUTPATIENT_CLINIC_OR_DEPARTMENT_OTHER): Payer: Self-pay

## 2022-11-12 ENCOUNTER — Ambulatory Visit: Payer: Medicaid Other

## 2022-11-12 ENCOUNTER — Ambulatory Visit (HOSPITAL_BASED_OUTPATIENT_CLINIC_OR_DEPARTMENT_OTHER): Payer: Medicaid Other

## 2022-11-12 VITALS — BP 103/51 | HR 81 | Ht 66.0 in | Wt 116.8 lb

## 2022-11-12 DIAGNOSIS — Z3201 Encounter for pregnancy test, result positive: Secondary | ICD-10-CM

## 2022-11-12 DIAGNOSIS — N912 Amenorrhea, unspecified: Secondary | ICD-10-CM

## 2022-11-12 LAB — POCT URINE PREGNANCY: Preg Test, Ur: POSITIVE — AB

## 2022-11-12 NOTE — Progress Notes (Signed)
Patient came in today to do a pregnancy test for confirmation. tbw

## 2022-11-15 ENCOUNTER — Encounter (HOSPITAL_BASED_OUTPATIENT_CLINIC_OR_DEPARTMENT_OTHER): Payer: Self-pay | Admitting: Certified Nurse Midwife

## 2022-11-15 DIAGNOSIS — Z1332 Encounter for screening for maternal depression: Secondary | ICD-10-CM | POA: Diagnosis not present

## 2022-11-15 DIAGNOSIS — Z3687 Encounter for antenatal screening for uncertain dates: Secondary | ICD-10-CM | POA: Diagnosis not present

## 2022-11-15 DIAGNOSIS — Z114 Encounter for screening for human immunodeficiency virus [HIV]: Secondary | ICD-10-CM | POA: Diagnosis not present

## 2022-11-15 DIAGNOSIS — Z3481 Encounter for supervision of other normal pregnancy, first trimester: Secondary | ICD-10-CM | POA: Diagnosis not present

## 2022-11-16 ENCOUNTER — Other Ambulatory Visit (HOSPITAL_BASED_OUTPATIENT_CLINIC_OR_DEPARTMENT_OTHER): Payer: Self-pay | Admitting: *Deleted

## 2022-11-16 MED ORDER — DOXYLAMINE-PYRIDOXINE 10-10 MG PO TBEC
DELAYED_RELEASE_TABLET | ORAL | 2 refills | Status: DC
Start: 2022-11-16 — End: 2022-12-05

## 2022-11-21 ENCOUNTER — Other Ambulatory Visit (HOSPITAL_BASED_OUTPATIENT_CLINIC_OR_DEPARTMENT_OTHER): Payer: Self-pay | Admitting: *Deleted

## 2022-11-21 MED ORDER — PROMETHAZINE HCL 25 MG PO TABS
25.0000 mg | ORAL_TABLET | Freq: Four times a day (QID) | ORAL | 0 refills | Status: DC | PRN
Start: 2022-11-21 — End: 2022-12-05

## 2022-11-26 ENCOUNTER — Other Ambulatory Visit (HOSPITAL_COMMUNITY)
Admission: RE | Admit: 2022-11-26 | Discharge: 2022-11-26 | Disposition: A | Discharge status: Home / Self Care | Payer: Medicaid Other | Source: Skilled Nursing Facility | Attending: Certified Nurse Midwife | Admitting: Certified Nurse Midwife

## 2022-11-26 ENCOUNTER — Ambulatory Visit (HOSPITAL_BASED_OUTPATIENT_CLINIC_OR_DEPARTMENT_OTHER): Payer: Medicaid Other | Admitting: Certified Nurse Midwife

## 2022-11-26 ENCOUNTER — Encounter (HOSPITAL_BASED_OUTPATIENT_CLINIC_OR_DEPARTMENT_OTHER): Payer: Self-pay

## 2022-11-26 ENCOUNTER — Encounter (HOSPITAL_BASED_OUTPATIENT_CLINIC_OR_DEPARTMENT_OTHER): Payer: Self-pay | Admitting: Certified Nurse Midwife

## 2022-11-26 ENCOUNTER — Ambulatory Visit (HOSPITAL_BASED_OUTPATIENT_CLINIC_OR_DEPARTMENT_OTHER): Payer: Medicaid Other | Admitting: *Deleted

## 2022-11-26 DIAGNOSIS — Z3481 Encounter for supervision of other normal pregnancy, first trimester: Secondary | ICD-10-CM

## 2022-11-26 DIAGNOSIS — Z124 Encounter for screening for malignant neoplasm of cervix: Secondary | ICD-10-CM

## 2022-11-26 DIAGNOSIS — Z113 Encounter for screening for infections with a predominantly sexual mode of transmission: Secondary | ICD-10-CM | POA: Diagnosis not present

## 2022-11-26 DIAGNOSIS — Z3A01 Less than 8 weeks gestation of pregnancy: Secondary | ICD-10-CM

## 2022-11-26 DIAGNOSIS — K219 Gastro-esophageal reflux disease without esophagitis: Secondary | ICD-10-CM

## 2022-11-26 DIAGNOSIS — Z3480 Encounter for supervision of other normal pregnancy, unspecified trimester: Secondary | ICD-10-CM | POA: Diagnosis present

## 2022-11-26 DIAGNOSIS — Z3689 Encounter for other specified antenatal screening: Secondary | ICD-10-CM

## 2022-11-26 DIAGNOSIS — O219 Vomiting of pregnancy, unspecified: Secondary | ICD-10-CM

## 2022-11-26 DIAGNOSIS — O99611 Diseases of the digestive system complicating pregnancy, first trimester: Secondary | ICD-10-CM

## 2022-11-26 MED ORDER — BLOOD PRESSURE KIT DEVI
1.0000 | Freq: Once | 0 refills | Status: AC
Start: 2022-11-26 — End: 2022-11-26

## 2022-11-26 MED ORDER — ONDANSETRON 8 MG PO TBDP: 8.0000 mg | ORAL_TABLET | Freq: Three times a day (TID) | ORAL | 3 refills | Status: DC | PRN

## 2022-11-26 MED ORDER — PANTOPRAZOLE SODIUM 40 MG PO TBEC: 40.0000 mg | DELAYED_RELEASE_TABLET | Freq: Every day | ORAL | 3 refills | Status: DC

## 2022-11-26 MED ORDER — DICLEGIS 10-10 MG PO TBEC: 2.0000 | DELAYED_RELEASE_TABLET | Freq: Every day | ORAL | 1 refills | Status: DC

## 2022-11-26 MED ORDER — PROMETHAZINE HCL 25 MG PO TABS: 25.0000 mg | ORAL_TABLET | Freq: Four times a day (QID) | ORAL | 1 refills | Status: DC | PRN

## 2022-11-26 NOTE — Telephone Encounter (Signed)
Called pt in response to [employer]. Pt with complaints of intense heartburn and nausea with not much relief from OTC products. Pt offered appt this afternoon with provider. Pt accepted

## 2022-11-26 NOTE — Progress Notes (Unsigned)
INITIAL PRENATAL VISIT  Subjective:   Mary Cochran is being seen today for her first obstetrical visit.  This is a planned pregnancy. This is a desired pregnancy.  She is at [redacted]w[redacted]d gestation by [place].  Her obstetrical history is significant for  none . Relationship with FOB: spouse, living together. Patient does intend to breast feed. Pregnancy history fully reviewed.  Patient reports heartburn, nausea, vomiting, and heartburn not relieved with Tums or Pepcid .  Indications for ASA therapy (per uptodate) One of the following: Previous pregnancy with preeclampsia, especially early onset and with an adverse outcome No Multifetal gestation No Chronic hypertension No Type 1 or 2 diabetes mellitus No Chronic kidney disease No Autoimmune disease (antiphospholipid syndrome, systemic lupus erythematosus) No  Two or more of the following: Nulliparity No Obesity (body mass index >30 kg/m2) No Family history of preeclampsia in mother or sister No Age >=35 years No Sociodemographic characteristics (African American race, low socioeconomic level) No Personal risk factors (eg, previous pregnancy with low birth weight or small for gestational age infant, previous adverse pregnancy outcome [eg, stillbirth], interval >10 years between pregnancies) No  Indications for early GDM screening  First-degree relative with diabetes No BMI >30kg/m2 No Age > 25 No Previous birth of an infant weighing >=4000 g No Gestational diabetes mellitus in a previous pregnancy No Glycated hemoglobin >=5.7 percent (39 mmol/mol), impaired glucose tolerance, or impaired fasting glucose on previous testing No High-risk race/ethnicity (eg, African American, Latino, Native American, Asian American, Pacific Islander) No Previous stillbirth of unknown cause No Maternal birthweight > 9 lbs No History of cardiovascular disease No Hypertension or on therapy for hypertension No High-density lipoprotein cholesterol level <35  mg/dL (2.53 mmol/L) and/or a triglyceride level >250 mg/dL (6.64 mmol/L) No Polycystic ovary syndrome No Physical inactivity No Other clinical condition associated with insulin resistance (eg, severe obesity, acanthosis nigricans) No Current use of glucocorticoids No   Early screening tests: FBS, A1C, Random CBG, glucose challenge   Review of Systems:   Review of Systems: +nausea, vomiting, dehydration, fatigue. No vaginal spotting or bleeding.   Objective:    Obstetric History OB History  Gravida Para Term Preterm AB Living  3 2 2     2   SAB IAB Ectopic Multiple Live Births        0 2    # Outcome Date GA Lbr Len/2nd Weight Sex Type Anes PTL Lv  3 Current           2 Term 08/05/18 [redacted]w[redacted]d  6 lb 11 oz (3.033 kg) M Vag-Spont  N LIV  1 Term 07/07/17 [redacted]w[redacted]d 01:46 / 01:03 6 lb 3.1 oz (2.81 kg) F Vag-Spont EPI  LIV    Past Medical History:  Diagnosis Date   Adenomatous polyp of colon 10/04/2015   ALLERGIC RHINITIS 12/12/2006   Allergy    SEASONAL   Anxiety    Attention and concentration deficit 06/08/2021   Depression 10/03/2011   GILBERT'S SYNDROME 03/07/2010   Headache    Migraines   IBS (irritable bowel syndrome) 10/04/2015   INSOMNIA-SLEEP DISORDER-UNSPEC 04/18/2007   Nonallopathic lesion of lumbosacral region 10/26/2015   Nonallopathic lesion of sacral region 10/26/2015   Nonallopathic lesion of thoracic region 10/26/2015   SYNCOPE 10/07/2008   Qualifier: Diagnosis of   By: Jonny Ruiz MD, Len Blalock        Tubular adenoma of colon 10/2014   Vertigo 04/06/2012    Past Surgical History:  Procedure Laterality Date   COLONOSCOPY  2016   MOUTH SURGERY     TONSILLECTOMY      Current Outpatient Medications on File Prior to Visit  Medication Sig Dispense Refill   Doxylamine-Pyridoxine 10-10 MG TBEC Take 2 tablets at bedtime; may add 1 tablet at breakfast & 1 tablet at lunch if needed. 100 tablet 2   Magnesium Glycinate 120 MG CAPS Take 3 capsules by mouth daily in the  afternoon.     Omega-3 Fatty Acids (FISH OIL OMEGA-3 PO) Take 640 mg by mouth daily in the afternoon. 3 capsules     Prenatal Vit-Fe Fumarate-FA (PRENATAL MULTIVITAMIN) TABS tablet Take 1 tablet by mouth daily at 12 noon.     Probiotic Product (PROBIOTIC PO) Take by mouth.     promethazine (PHENERGAN) 25 MG tablet Take 1 tablet (25 mg total) by mouth every 6 (six) hours as needed for nausea or vomiting. 30 tablet 0   sertraline (ZOLOFT) 50 MG tablet TAKE ONE TABLET BY MOUTH AT BEDTIME 90 tablet 1   No current facility-administered medications on file prior to visit.    Allergies  Allergen Reactions   Azithromycin Other (See Comments)    Severe stomach ache   Hydrocodone Other (See Comments)    GI upset   Morphine     Unknown, childhood reaction   Ciprofloxacin Hcl Hives    Social History:  reports that she has never smoked. She has never used smokeless tobacco. She reports that she does not currently use alcohol. She reports that she does not use drugs.  Family History  Problem Relation Age of Onset   Varicose Veins Mother    Colon polyps Mother    Heart disease Father    Kidney cancer Maternal Aunt    Breast cancer Maternal Grandmother    Heart disease Maternal Grandmother    Miscarriages / Stillbirths Maternal Grandmother    Heart disease Maternal Grandfather    Varicose Veins Maternal Grandfather    Pancreatic cancer Other    Colon cancer Neg Hx    Esophageal cancer Neg Hx    Rectal cancer Neg Hx    Stomach cancer Neg Hx     The following portions of the patient's history were reviewed and updated as appropriate: allergies, current medications, past family history, past medical history, past social history, past surgical history and problem list.  Review of Systems Review of Systems    Physical Exam:  BP 105/64   Pulse 74   Wt 118 lb 12.8 oz (53.9 kg)   LMP 09/27/2022 Comment: GA is 4 weeks and 6 days  BMI 19.17 kg/m  CONSTITUTIONAL: Well-developed,  well-nourished female in no acute distress.  HENT:  Normocephalic, atraumatic.  Oropharynx is clear and moist EYES: Conjunctivae normal. No scleral icterus.  NECK: Normal range of motion, supple, no masses.  Normal thyroid.  SKIN: Skin is warm and dry. No rash noted. Not diaphoretic. No erythema. No pallor. MUSCULOSKELETAL: Normal range of motion. No tenderness.  No cyanosis, clubbing, or edema.   NEUROLOGIC: Alert and oriented to person, place, and time. Normal muscle tone coordination.  PSYCHIATRIC: Normal mood and affect. Normal behavior. Normal judgment and thought content. CARDIOVASCULAR: Normal heart rate noted, regular rhythm RESPIRATORY: . Effort and breath sounds normal, no problems with respiration noted. ABDOMEN: Soft, normal bowel sounds, no distention noted.  No tenderness, rebound or guarding.  Pt declines Pelvic and physical exam today, has nausea/vomiting, doesn't feel well. She would like to wait until postpartum for pap smear.  Movement: Absent       Assessment:    Pregnancy: G3P2002  1. Gilbert syndrome -Routine CMP  2. Cervical cancer screening -Pt declined pap today, she would like to wait for postpartum - Cytology - PAP( [place])  3. Routine screening for STI (sexually transmitted infection) -GC/CT collected   4. [redacted] weeks gestation of pregnancy -EDD confirmed by [place]  5. Supervision of other normal pregnancy, antepartum -Bedside limited transabd US today confirms single viable IUP with FHR 158bpm.  6. Gastroesophageal Reflux in Pregnancy -Not relieved with Tums/Pepcid -Rx sent for Protonix 40mg  po daily  7. Nausea/Vomiting in Pregnancy -Diclegis 2 po at bedtime -Discussed Zofran with potential risks, Rx sent -May continue Phenergan  RTO 4 weeks for ROB.   Letta Kocher      Plan:     Initial labs will be drawn at 10 weeks.  Prenatal vitamins. Problem list reviewed and updated. Reviewed in detail the nature of the practice with  collaborative care between  Genetic screening discussed: NIPS/First trimester screen/Quad/AFP requested. Role of ultrasound in pregnancy discussed; Anatomy US: requested. Amniocentesis discussed: not indicated. Follow up in 4 weeks. Discussed clinic routines, schedule of care and testing, genetic screening options, involvement of students and residents under the direct supervision of APPs and doctors and presence of female providers. Pt verbalized understanding.   Letta Kocher, CNM 11/26/2022 4:37 PM

## 2022-11-27 LAB — CERVICOVAGINAL ANCILLARY ONLY
Chlamydia: NEGATIVE
Comment: NEGATIVE
Comment: NORMAL
Neisseria Gonorrhea: NEGATIVE

## 2022-11-27 NOTE — Progress Notes (Signed)
New OB Intake  I explained I am completing New OB Intake today. We discussed EDD of 07/11/2023, by Ultrasound. Pt is G3P2002. I reviewed her allergies, medications and Medical/Surgical/OB history.    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 Discussed the nature of our practice with multiple providers including residents and students. Due to the size of the practice, the delivering provider may not be the same as those providing prenatal care.   Patient is interested in water birth. Offered upcoming OB visit with CNM to discuss further.  MyChart/Babyscripts MyChart access verified. I explained pt will have some visits in office and some virtually. Babyscripts instructions given and order placed. Patient verifies receipt of registration text/e-mail. Account successfully created and app downloaded.  Blood Pressure Cuff/Weight Scale Blood pressure cuff ordered for patient to pick-up from [employer]. Explained after first prenatal appt pt will check weekly and document in Babyscripts. Patient does have weight scale.  Anatomy US Explained first scheduled [place] will be around 19 weeks. Anatomy US scheduled for 02/14/23 at 0915.   Is patient a candidate for Babyscripts Optimization? Yes  First visit review I reviewed new OB appt with patient. Explained pt will be seen by Merrilee Jansky, CNM at first visit. Discussed Avelina Laine genetic screening with patient. Pt would like Pan[facility] and [facility], wants to know gender. Routine prenatal labs are needed.  Last Pap Done in 2022 at [facility]; will request records  Harrie Jeans, RN 11/27/2022  8:58 AM

## 2022-11-29 ENCOUNTER — Encounter (HOSPITAL_BASED_OUTPATIENT_CLINIC_OR_DEPARTMENT_OTHER): Payer: Self-pay | Admitting: Certified Nurse Midwife

## 2022-12-02 ENCOUNTER — Encounter: Payer: Self-pay | Admitting: Physician Assistant

## 2022-12-05 ENCOUNTER — Ambulatory Visit (HOSPITAL_BASED_OUTPATIENT_CLINIC_OR_DEPARTMENT_OTHER): Payer: Medicaid Other | Admitting: Certified Nurse Midwife

## 2022-12-05 VITALS — BP 108/70 | HR 75 | Wt 122.4 lb

## 2022-12-05 DIAGNOSIS — O99611 Diseases of the digestive system complicating pregnancy, first trimester: Secondary | ICD-10-CM

## 2022-12-05 DIAGNOSIS — K5900 Constipation, unspecified: Secondary | ICD-10-CM | POA: Insufficient documentation

## 2022-12-05 DIAGNOSIS — Z3A08 8 weeks gestation of pregnancy: Secondary | ICD-10-CM

## 2022-12-05 NOTE — BH Specialist Note (Deleted)
Integrated Behavioral Health via Telemedicine Visit  12/05/2022 Mary Cochran [id number]  Number of Integrated Behavioral Health Clinician visits: No data recorded Session Start time: No data recorded  Session End time: No data recorded Total time in minutes: No data recorded  Referring Provider: *** Patient/Family location: *** [facility] Provider location: *** All persons participating in visit: *** Types of Service: {CHL AMB TYPE OF SERVICE:[phone]}  I connected with Mary Cochran and/or Mary Cochran {family members:20773} via  Telephone or Engineer, civil (consulting)  (Video is Surveyor, mining) and verified that I am speaking with the correct person using two identifiers. Discussed confidentiality: {YES/NO:21197}  I discussed the limitations of telemedicine and the availability of in person appointments.  Discussed there is a possibility of technology failure and discussed alternative modes of communication if that failure occurs.  I discussed that engaging in this telemedicine visit, they consent to the provision of behavioral healthcare and the services will be billed under their insurance.  Patient and/or legal guardian expressed understanding and consented to Telemedicine visit: {YES/NO:21197}  Presenting Concerns: Patient and/or family reports the following symptoms/concerns: *** Duration of problem: ***; Severity of problem: {Mild/Moderate/Severe:20260}  Patient and/or Family's Strengths/Protective Factors: {CHL AMB BH PROTECTIVE FACTORS:[phone]}  Goals Addressed: Patient will:  Reduce symptoms of: {IBH Symptoms:21014056}   Increase knowledge and/or ability of: {IBH Patient Tools:21014057}   Demonstrate ability to: {IBH Goals:21014053}  Progress towards Goals: {CHL AMB BH PROGRESS TOWARDS GOALS:[phone]}  Interventions: Interventions utilized:  {IBH Interventions:21014054} Standardized Assessments completed: {IBH Screening  Tools:21014051}  Patient and/or Family Response: ***  Assessment: Patient currently experiencing ***.   Patient may benefit from ***.  Plan: Follow up with behavioral health clinician on : *** Behavioral recommendations: *** Referral(s): {IBH Referrals:21014055}  I discussed the assessment and treatment plan with the patient and/or parent/guardian. They were provided an opportunity to ask questions and all were answered. They agreed with the plan and demonstrated an understanding of the instructions.   They were advised to call back or seek an in-person evaluation if the symptoms worsen or if the condition fails to improve as anticipated.  Valetta Close Sharnise Blough, LCSW

## 2022-12-05 NOTE — Progress Notes (Signed)
  Pt reports severe constipation. Doesn't thinks she has had any type of bowel movement in over a week. She did start Zofran but has discontinued it due to constipation. She has tried OTC products including Miralax without success. Was instructed that she could bring enema with her to office and CNM would administer. Pt brought in Fleet Saline enema. Positioned on left side and enema administered. She passed moderate amount stool. Pt felt like there was still a significant amount of stool and requested 2nd saline enema. 2nd saline enema administered with good results.  Pt encouraged to stop prenatal vitamins for a few days (due to iron content), try to drink as much water as possible, and consider Miralax tomorrow if she still feels constipated.  Bedside limited transabd US confirmed FHR 150bpm.  Assessment: G3P2002 at 8w6 Constipation  Plan: Increased hydration Hold on prenatal vitamins until bowel movements resume Pt will call tomorrow if another enema needed.  Letta Kocher

## 2022-12-06 ENCOUNTER — Encounter (HOSPITAL_BASED_OUTPATIENT_CLINIC_OR_DEPARTMENT_OTHER): Payer: Medicaid Other | Admitting: Certified Nurse Midwife

## 2022-12-06 ENCOUNTER — Encounter (HOSPITAL_BASED_OUTPATIENT_CLINIC_OR_DEPARTMENT_OTHER): Payer: Medicaid Other

## 2022-12-07 ENCOUNTER — Other Ambulatory Visit: Payer: Self-pay | Admitting: Physician Assistant

## 2022-12-07 MED ORDER — SERTRALINE HCL 100 MG PO TABS
100.0000 mg | ORAL_TABLET | Freq: Every day | ORAL | 3 refills | Status: DC
Start: 2022-12-07 — End: 2023-12-26

## 2022-12-13 ENCOUNTER — Encounter (HOSPITAL_BASED_OUTPATIENT_CLINIC_OR_DEPARTMENT_OTHER): Payer: Medicaid Other

## 2022-12-13 ENCOUNTER — Encounter (HOSPITAL_BASED_OUTPATIENT_CLINIC_OR_DEPARTMENT_OTHER): Payer: Self-pay | Admitting: Certified Nurse Midwife

## 2022-12-13 ENCOUNTER — Other Ambulatory Visit (HOSPITAL_BASED_OUTPATIENT_CLINIC_OR_DEPARTMENT_OTHER): Payer: Medicaid Other

## 2022-12-13 ENCOUNTER — Encounter (HOSPITAL_BASED_OUTPATIENT_CLINIC_OR_DEPARTMENT_OTHER): Payer: Medicaid Other | Admitting: Certified Nurse Midwife

## 2022-12-13 DIAGNOSIS — Z3481 Encounter for supervision of other normal pregnancy, first trimester: Secondary | ICD-10-CM | POA: Diagnosis not present

## 2022-12-13 DIAGNOSIS — Z3480 Encounter for supervision of other normal pregnancy, unspecified trimester: Secondary | ICD-10-CM | POA: Diagnosis not present

## 2022-12-14 ENCOUNTER — Encounter: Payer: Self-pay | Admitting: Physician Assistant

## 2022-12-14 DIAGNOSIS — Z20822 Contact with and (suspected) exposure to covid-19: Secondary | ICD-10-CM | POA: Diagnosis not present

## 2022-12-14 DIAGNOSIS — R6883 Chills (without fever): Secondary | ICD-10-CM | POA: Diagnosis not present

## 2022-12-14 LAB — CBC
Hematocrit: 42.4 % (ref 34.0–46.6)
Hemoglobin: 13.5 g/dL (ref 11.1–15.9)
MCH: 30.5 pg (ref 26.6–33.0)
MCHC: 31.8 g/dL (ref 31.5–35.7)
MCV: 96 fL (ref 79–97)
Platelets: 272 10*3/uL (ref 150–450)
RBC: 4.42 x10E6/uL (ref 3.77–5.28)
RDW: 12 % (ref 11.7–15.4)
WBC: 6.6 10*3/uL (ref 3.4–10.8)

## 2022-12-14 LAB — ABO/RH: Rh Factor: NEGATIVE

## 2022-12-14 LAB — HEPATITIS C ANTIBODY: Hep C Virus Ab: NONREACTIVE

## 2022-12-14 LAB — HIV ANTIBODY (ROUTINE TESTING W REFLEX): HIV Screen 4th Generation wRfx: NONREACTIVE

## 2022-12-14 LAB — RUBELLA SCREEN: Rubella Antibodies, IGG: 2.09 {index}

## 2022-12-14 LAB — HEPATITIS B SURFACE ANTIGEN: Hepatitis B Surface Ag: NEGATIVE

## 2022-12-14 LAB — RPR: RPR Ser Ql: NONREACTIVE

## 2022-12-14 LAB — ANTIBODY SCREEN: Antibody Screen: NEGATIVE

## 2022-12-19 DIAGNOSIS — Z3490 Encounter for supervision of normal pregnancy, unspecified, unspecified trimester: Secondary | ICD-10-CM | POA: Diagnosis not present

## 2022-12-20 ENCOUNTER — Ambulatory Visit (HOSPITAL_BASED_OUTPATIENT_CLINIC_OR_DEPARTMENT_OTHER): Payer: Medicaid Other | Admitting: Certified Nurse Midwife

## 2022-12-20 VITALS — BP 104/69 | HR 88 | Wt 119.2 lb

## 2022-12-20 DIAGNOSIS — Z3480 Encounter for supervision of other normal pregnancy, unspecified trimester: Secondary | ICD-10-CM

## 2022-12-20 DIAGNOSIS — Z3A11 11 weeks gestation of pregnancy: Secondary | ICD-10-CM

## 2022-12-20 NOTE — Progress Notes (Signed)
    PRENATAL VISIT NOTE  Subjective:  Mary Cochran is a 27 y.o. G3P2002 at [redacted]w[redacted]d being seen today for ongoing prenatal care.  She is currently monitored for the following issues for this low-risk pregnancy and has Supervision of other normal pregnancy, antepartum; Gilbert syndrome; Nausea and vomiting during pregnancy; Gastroesophageal reflux during pregnancy, antepartum, first trimester; and Constipation during pregnancy in first trimester on their problem list.  Patient reports  reflux. She has tried Protonix (made it worse), Pepcid, Tums. She will consider Mylanta .  Contractions: Not present. Vag. Bleeding: None.  Movement: Present. Denies leaking of fluid.   The following portions of the patient's history were reviewed and updated as appropriate: allergies, current medications, past family history, past medical history, past social history, past surgical history and problem list.   Objective:   Vitals:   12/20/22 0910  BP: 104/69  Pulse: 88  Weight: 119 lb 3.2 oz (54.1 kg)    Fetal Status: Fetal Heart Rate (bpm): 165   Movement: Present     General:  Alert, oriented and cooperative. Patient is in no acute distress.  Skin: Skin is warm and dry. No rash noted.   Cardiovascular: Normal heart rate noted  Respiratory: Normal respiratory effort, no problems with respiration noted  Abdomen: Soft, gravid, appropriate for gestational age.  Pain/Pressure: Absent     Pelvic: Cervical exam deferred        Extremities: Normal range of motion.  Edema: None  Mental Status: Normal mood and affect. Normal behavior. Normal judgment and thought content.   Assessment and Plan:  Pregnancy: G3P2002 at [redacted]w[redacted]d 1. [redacted] weeks gestation of pregnancy  2. Supervision of other normal pregnancy, antepartum   Preterm labor symptoms and general obstetric precautions including but not limited to vaginal bleeding, contractions, leaking of fluid and fetal movement were reviewed in detail with the  patient. Please refer to After Visit Summary for other counseling recommendations.   No follow-ups on file.  Future Appointments  Date Time Provider Department Center  01/16/2023  9:15 AM [facility] HEALTH CLINICIAN [facility]  01/17/2023  9:35 AM Jerene Bears, MD [facility]  02/14/2023  9:15 AM [facility] NURSE [facility]  02/14/2023  9:30 AM [facility] US2 [facility]  02/15/2023  9:15 AM Shawna Wearing, Toma Aran, CNM [facility]  03/14/2023  9:15 AM Letti Towell, Toma Aran, CNM [facility]  04/11/2023  8:35 AM Michaela Broski, Toma Aran, CNM [facility]  05/02/2023  9:15 AM Jerene Bears, MD [facility]  05/16/2023  9:15 AM Margean Korell, Toma Aran, CNM [facility]  05/30/2023  9:15 AM Marcea Rojek, Toma Aran, CNM [facility]  06/13/2023  9:15 AM Jerene Bears, MD [facility]  06/20/2023  9:15 AM October Peery, Toma Aran, CNM [facility]  06/27/2023  8:55 AM Johnwilliam Shepperson, Toma Aran, CNM [facility]  07/04/2023  9:15 AM Haldon Carley, Toma Aran, CNM [facility]  07/11/2023  9:15 AM Gabby Rackers, Toma Aran, CNM [facility]    Letta Kocher, CNM

## 2022-12-21 ENCOUNTER — Encounter (HOSPITAL_BASED_OUTPATIENT_CLINIC_OR_DEPARTMENT_OTHER): Payer: Self-pay | Admitting: Certified Nurse Midwife

## 2022-12-23 LAB — HORIZON CUSTOM: REPORT SUMMARY: NEGATIVE

## 2023-01-02 NOTE — BH Specialist Note (Unsigned)
Integrated Behavioral Health via Telemedicine Visit  01/16/2023 [name] [id number]  Number of Integrated Behavioral Health Clinician visits: 1- Initial Visit  Session Start time: 0915   Session End time: 0939  Total time in minutes: 24   Referring Provider: Merrilee Jansky, CNM Patient/Family location: Home [facility] Provider location: Center for [facility]  All persons participating in visit: [name] and [facility] [place]   Types of Service: Individual psychotherapy and Video visit  I connected with Mary Cochran and/or Mary Cochran's  n/a  via  Telephone or Engineer, civil (consulting)  (Video is Caregility application) and verified that I am speaking with the correct person using two identifiers. Discussed confidentiality: Yes   I discussed the limitations of telemedicine and the availability of in person appointments.  Discussed there is a possibility of technology failure and discussed alternative modes of communication if that failure occurs.  I discussed that engaging in this telemedicine visit, they consent to the provision of behavioral healthcare and the services will be billed under their insurance.  Patient and/or legal guardian expressed understanding and consented to Telemedicine visit: Yes   Presenting Concerns: Patient and/or family reports the following symptoms/concerns: Increased anxiety and depression in current pregnancy, attributes to history of depression and anxiety, as well as current nausea and reflux; mood improvement after increasing Zoloft to 100mg ; pt sees ongoing therapist every two weeks. Pt interested in having a waterbirth; has good support at home.  Duration of problem: Ongoing with increase recent; Severity of problem: moderate  Patient and/or Family's Strengths/Protective Factors: Social connections, Concrete supports in place (healthy food, safe environments,  etc.), Sense of purpose, and Physical Health (exercise, healthy diet, medication compliance, etc.)  Goals Addressed: Patient will:  Reduce symptoms of: anxiety and depression    Demonstrate ability to: Increase healthy adjustment to current life circumstances  Progress towards Goals: Ongoing  Interventions: Interventions utilized:  Medication Monitoring, Psychoeducation and/or Health Education, and Supportive Reflection Standardized Assessments completed: GAD-7 and PHQ 9  Patient and/or Family Response: Patient agrees with treatment plan.   Assessment: Patient currently experiencing Adjustment disorder with mixed anxious and depressed mood.   Patient may benefit from psychoeducation and brief therapeutic interventions regarding coping with symptoms of anxiety and depression .  Plan: Follow up with behavioral health clinician on : Call Ankur Snowdon at [phone], as needed. Behavioral recommendations:  -Continue taking Zoloft 100mg  as prescribed -Continue prioritizing healthy self-care (regular meals, adequate rest; allowing practical help from supportive friends and family)  -Consider new mom support group as needed at either [email or web address] or [email or web address]  -Consider seeing therapist every two weeks for as long as remains helpful -Continue plan to register for waterbirth class of choice Referral(s): Integrated [employer] (In Clinic)  I discussed the assessment and treatment plan with the patient and/or parent/guardian. They were provided an opportunity to ask questions and all were answered. They agreed with the plan and demonstrated an understanding of the instructions.   They were advised to call back or seek an in-person evaluation if the symptoms worsen or if the condition fails to improve as anticipated.  Rae Lips, LCSW     01/16/2023    9:24 AM 11/27/2022    8:13 AM 03/29/2022    9:46 AM 08/02/2021    2:26 PM 06/16/2020    1:05 PM   Depression screen PHQ 2/9  Decreased Interest 1 2 1 1 1   Down, Depressed, Hopeless 1  2 1 1 1   PHQ - 2 Score 2 4 2 2 2   Altered sleeping 1 2 1 1 2   Tired, decreased energy 3 3 1 2 3   Change in appetite 0 2 1 1 2   Feeling bad or failure about yourself  1 2 0 1 0  Trouble concentrating 0 2 1 1 2   Moving slowly or fidgety/restless 0 0 0 1 0  Suicidal thoughts 0 0 0 0 0  PHQ-9 Score 7 15 6 9 11   Difficult doing work/chores   Somewhat difficult Somewhat difficult Somewhat difficult      01/16/2023    9:27 AM 11/27/2022    8:13 AM 03/29/2022    9:46 AM 02/03/2019   10:51 AM  GAD 7 : Generalized Anxiety Score  Nervous, Anxious, on Edge 1 2 2 3   Control/stop worrying 0 2 2 3   Worry too much - different things 1 2 2 3   Trouble relaxing 0 2 2 3   Restless 0 1 2 3   Easily annoyed or irritable 1 2 2 3   Afraid - awful might happen 1 0 1 1  Total GAD 7 Score 4 11 13 19   Anxiety Difficulty   Somewhat difficult Somewhat difficult

## 2023-01-07 ENCOUNTER — Encounter (HOSPITAL_BASED_OUTPATIENT_CLINIC_OR_DEPARTMENT_OTHER): Payer: Self-pay | Admitting: Certified Nurse Midwife

## 2023-01-16 ENCOUNTER — Ambulatory Visit: Payer: Medicaid Other | Admitting: Clinical

## 2023-01-16 DIAGNOSIS — F4323 Adjustment disorder with mixed anxiety and depressed mood: Secondary | ICD-10-CM

## 2023-01-16 NOTE — Patient Instructions (Signed)
[facility] [address] [place], [place] [address] [phone] (main office) [phone] (Eknoor Novack's office)     BRAINSTORMING  Develop a Plan Goals: Provide a way to start conversation about your new life with a baby Assist parents in recognizing and using resources within their reach Help pave the way before birth for an easier period of transition afterwards.  Make a list of the following information to keep in a central location: Full name of Mom and Partner: _____________________________________________ Baby's full name and Date of Birth: ___________________________________________ Home Address: ___________________________________________________________ ________________________________________________________________________ Home Phone: ____________________________________________________________ Parents' cell numbers: _____________________________________________________ ________________________________________________________________________ Name and contact info for OB: ______________________________________________ Name and contact info for Pediatrician:________________________________________ Contact info for Lactation Consultants: ________________________________________  REST and SLEEP *You each need at least 4-5 hours of uninterrupted sleep every day. Write specific names and contact information.* How are you going to rest in the postpartum period? While partner's home? When partner returns to work? When you both return to work? Where will your baby sleep? Who is available to help during the day? Evening? Night? Who could move in for a period to help support you? What are some ideas to help you get enough  sleep? __________________________________________________________________________________________________________________________________________________________________________________________________________________________________________ NUTRITIOUS FOOD AND DRINK *Plan for meals before your baby is born so you can have healthy food to eat during the immediate postpartum period.* Who will look after breakfast? Lunch? Dinner? List names and contact information. Brainstorm quick, healthy ideas for each meal. What can you do before baby is born to prepare meals for the postpartum period? How can others help you with meals? Which grocery stores provide online shopping and delivery? Which restaurants offer take-out or delivery options? ______________________________________________________________________________________________________________________________________________________________________________________________________________________________________________________________________________________________________________________________________________________________________________________________________  CARE FOR MOM *It's important that mom is cared for and pampered in the postpartum period. Remember, the most important ways new mothers need care are: sleep, nutrition, gentle exercise, and time off.* Who can come take care of mom during this period? Make a list of people with their contact information. List some activities that make you feel cared for, rested, and energized? Who can make sure you have opportunities to do these things? Does mom have a space of her very own within your home that's just for her? Make a "Mama Cave" where she can be comfortable, rest, and renew herself  daily. ______________________________________________________________________________________________________________________________________________________________________________________________________________________________________________________________________________________________________________________________________________________________________________________________________    CARE FOR AND FEEDING BABY *Knowledgeable and encouraging people will offer the [place] support with regard to feeding your baby.* Educate yourself and choose the [place] feeding option for your baby. Make a list of people who will guide, support, and be a resource for you as your care for and feed your baby. (Friends that have breastfed or are currently breastfeeding, lactation consultants, breastfeeding support groups, etc.) Consider a postpartum doula. (These websites can give you information: dona.org & padanc.org) Seek out local breastfeeding resources like the breastfeeding support group at [facility] or [employer]ec[employer]. ______________________________________________________________________________________________________________________________________________________________________________________________________________________________________________________________________________________________________________________________________________________________________________________________________  CHORES AND ERRANDS Who can help with a thorough cleaning before baby is born? Make a list of people who will help with housekeeping and chores, like laundry, light cleaning, dishes, bathrooms, etc. Who can run some errands for you? What can you do to make sure you are stocked with basic supplies before baby is born? Who is going to do the  shopping? ______________________________________________________________________________________________________________________________________________________________________________________________________________________________________________________________________________________________________________________________________________________________________________________________________     Family Adjustment *Nurture yourselves.it helps parents be more loving and allows for better bonding with their child.* What sorts of things do you and partner enjoy   doing together? Which activities help you to connect and strengthen your relationship? Make a list of those things. Make a list of people whom you trust to care for your baby so you can have some time together as a couple. What types of things help partner feel connected to Mom? Make a list. What needs will partner have in order to bond with baby? Other children? Who will care for them when you go into labor and while you are in the hospital? Think about what the needs of your older children might be. Who can help you meet those needs? In what ways are you helping them prepare for bringing baby home? List some specific strategies you have for family adjustment. _______________________________________________________________________________________________________________________________________________________________________________________________________________________________________________________________________________________________________________________________________________  SUPPORT *Someone who can empathize with experiences normalizes your problems and makes them more bearable.* Make a list of other friends, neighbors, and/or co-workers you know with infants (and small children, if applicable) with whom you can connect. Make a list of local or online support groups, mom groups, etc. in which you can be  involved. ______________________________________________________________________________________________________________________________________________________________________________________________________________________________________________________________________________________________________________________________________________________________________________________________________  Childcare Plans Investigate and plan for childcare if mom is returning to work. Talk about mom's concerns about her transition back to work. Talk about partner's concerns regarding this transition.  Mental Health *Your mental health is one of the highest priorities for a pregnant or postpartum mom.* 1 in 5 women experience anxiety and/or depression from the time of conception through the first year after birth. Postpartum Mood Disorders are the #1 complication of pregnancy and childbirth and the suffering experienced by these mothers is not necessary! These illnesses are temporary and respond well to treatment, which often includes self-care, social support, talk therapy, and medication when needed. Women experiencing anxiety and depression often say things like: "I'm supposed to be happy.why do I feel so sad?", "Why can't I snap out of it?", "I'm having thoughts that scare me." There is no need to be embarrassed if you are feeling these symptoms: Overwhelmed, anxious, angry, sad, guilty, irritable, hopeless, exhausted but can't sleep You are NOT alone. You are NOT to blame. With help, you WILL be well. Where can I find help? Medical professionals such as your OB, midwife, gynecologist, family practitioner, primary care provider, pediatrician, or mental health providers; [facility] support groups: Feelings After Birth, Breastfeeding Support Group, Baby and Me Group, and Fit 4 Two exercise classes. You have permission to ask for help. It will confirm your feelings, validate your experiences,  share/learn coping strategies, and gain support and encouragement as you heal. You are important! BRAINSTORM Make a list of local resources, including resources for mom and for partner. Identify support groups. Identify people to call late at night - include names and contact info. Talk with partner about perinatal mood and anxiety disorders. Talk with your OB, midwife, and doula about baby blues and about perinatal mood and anxiety disorders. Talk with your pediatrician about perinatal mood and anxiety disorders.   Support & Sanity Savers   What do you really need?  Basics In preparing for a new baby, many expectant parents spend hours shopping for baby clothes, decorating the nursery, and deciding which car seat to buy. Yet most don't think much about what the reality of parenting a newborn will be like, and what they need to make it through that. So, here is the advice of experienced parents. We know you'll read this, and think "they're exaggerating, I don't really need that." Just trust us on these, OK? Plan for all of   this, and if it turns out you don't need it, come back and teach us how you did it!  Must-Haves (Once baby's survival needs are met, make sure you attend to your own survival needs!) Sleep An average newborn sleeps 16-18 hours per day, over 6-7 sleep periods, rarely more than three hours at a time. It is normal and healthy for a newborn to wake throughout the night... but really hard on parents!! Naps. Prioritize sleep above any responsibilities like: cleaning house, visiting friends, running errands, etc.  Sleep whenever baby sleeps. If you can't nap, at least have restful times when baby eats. The more rest you get, the more patient you will be, the more emotionally stable, and better at solving problems.  Food You may not have realized it would be difficult to eat when you have a newborn. Yet, when we talk to countless new parents, they say things like "it may be 2:00 pm  when I realize I haven't had breakfast yet." Or "every time we sit down to dinner, baby needs to eat, and my food gets cold, so I don't bother to eat it." Finger food. Before your baby is born, stock up with one months' worth of food that: 1) you can eat with one hand while holding a baby, 2) doesn't need to be prepped, 3) is good hot or cold, 4) doesn't spoil when left out for a few hours, and 5) you like to eat. Think about: nuts, dried fruit, Clif bars, pretzels, jerky, gogurt, baby carrots, apples, bananas, crackers, cheez-n-crackers, string cheese, hot pockets or frozen burritos to microwave, garden burgers and breakfast pastries to put in the toaster, yogurt drinks, etc. Restaurant Menus. Make lists of your favorite restaurants & menu items. When family/friends want to help, you can give specific information without much thought. They can either bring you the food or send gift cards for just the right meals. Freezer Meals.  Take some time to make a few meals to put in the freezer ahead of time.  Easy to freeze meals can be anything such as soup, lasagna, chicken pie, or spaghetti sauce. Set up a Meal Schedule.  Ask friends and family to sign up to bring you meals during the first few weeks of being home. (It can be passed around at baby showers!) You have no idea how helpful this will be until you are in the throes of parenting.  www.takethemameal.com is a great website to check out. Emotional Support Know who to call when you're stressed out. Parenting a newborn is very challenging work. There are times when it totally overwhelms your normal coping abilities. EVERY NEW PARENT NEEDS TO HAVE A PLAN FOR WHO TO CALL WHEN THEY JUST CAN'T COPE ANY MORE. (And it has to be someone other than the baby's other parent!) Before your baby is born, come up with at least one person you can call for support - write their phone number down and post it on the refrigerator. Anxiety & Sadness. Baby blues are normal after  pregnancy; however, there are more severe types of anxiety & sadness which can occur and should not be ignored.  They are always treatable, but you have to take the first step by reaching out for help. [facility] offers a "Mom Talk" group which meets every Tuesday from 10 am - 11 am.  This group is for new moms who need support and connection after their babies are born.  Call [phone].  Really, Really Helpful (Plan for them!   Make sure these happen often!!) Physical Support with Taking Care of Yourselves Asking friends and family. Before your baby is born, set up a schedule of people who can come and visit and help out (or ask a friend to schedule for you). Any time someone says "let me know what I can do to help," sign them up for a day. When they get there, their job is not to take care of the baby (that's your job and your joy). Their job is to take care of you!  Postpartum doulas. If you don't have anyone you can call on for support, look into postpartum doulas:  professionals at helping parents with caring for baby, caring for themselves, getting breastfeeding started, and helping with household tasks. www.padanc.org is a helpful website for learning about doulas in our area. Peer Support / Parent Groups Why: One of the greatest ideas for new parents is to be around other new parents. Parent groups give you a chance to share and listen to others who are going through the same season of life, get a sense of what is normal infant development by watching several babies learn and grow, share your stories of triumph and struggles with empathetic ears, and forgive your own mistakes when you realize all parents are learning by trial and error. Where to find: There are many places you can meet other new parents throughout our community.  [facility] offers the following classes for new moms and their little ones:  Baby and Me (Birth to Crawling) and Breastfeeding Support Group. Go to  www.conehealthybaby.com or call [phone] for more information. Time for your Relationship It's easy to get so caught up in meeting baby's immediate needs that it's hard to find time to connect with your partner, and meet the needs of your relationship. It's also easy to forget what "quality time with your partner" actually looks like. If you take your baby on a date, you'd be amazed how much of your couple time is spent feeding the baby, diapering the baby, admiring the baby, and talking about the baby. Dating: Try to take time for just the two of you. Babysitter tip: Sometimes when moms are breastfeeding a newborn, they find it hard to figure out how to schedule outings around baby's unpredictable feeding schedules. Have the babysitter come for a three hour period. When she comes over, if baby has just eaten, you can leave right away, and come back in two hours. If baby hasn't fed recently, you start the date at home. Once baby gets hungry and gets a good feeding in, you can head out for the rest of your date time. Date Nights at Home: If you can't get out, at least set aside one evening a week to prioritize your relationship: whenever baby dozes off or doesn't have any immediate needs, spend a little time focusing on each other. Potential conflicts: The main relationship conflicts that come up for new parents are: issues related to sexuality, financial stresses, a feeling of an unfair division of household tasks, and conflicts in parenting styles. The more you can work on these issues before baby arrives, the better!  Fun and Frills (Don't forget these. and don't feel guilty for indulging in them!) Everyone has something in life that is a fun little treat that they do just for themselves. It may be: reading the morning paper, or going for a daily jog, or having coffee with a friend once a week, or going to a movie on Friday nights,   or fine chocolates, or bubble baths, or curling up with a good  book. Unless you do fun things for yourself every now and then, it's hard to have the energy for fun with your baby. Whatever your "special" treats are, make sure you find a way to continue to indulge in them after your baby is born. These special moments can recharge you, and allow you to return to baby with a new joy   PERINATAL MOOD DISORDERS: MATERNAL MENTAL HEALTH FROM CONCEPTION THROUGH THE POSTPARTUM PERIOD   _________________________________________Emergency and Crisis Resources If you are an imminent risk to self or others, are experiencing intense personal distress, and/or have noticed significant changes in activities of daily living, call:  911 [facility]: [phone]  [address], [place], [place], [address] Mobile Crisis: 877-626-1772 National Suicide Hotline: 988 Or visit the following crisis centers: Local Emergency Departments [facility]: [address], [place]  [phone]. Hours: 8:30AM-5PM. Insurance Accepted: Medicaid, Medicare, and Uninsured.  [facility]:  [address], [place]  Mon-Friday 8am-3pm, [phone]                                                                                  ___________ Non-Crisis Resources To identify specific providers that are covered by your insurance, contact your insurance company or local agencies:  [facility]: 1-800-256-2452 [facility]: 888-581-9988 [facility]: 1-800-939-5911 Postpartum Support International- Warm-line: 1-800-944-4773                                                      __Outpatient Therapy and Medication Management   Providers:  [facility]: [phone] Hours: 9AM-5PM  Insurance Accepted: AARP, Aetna, BCBS, Cigna, [employer], Humana, Medicare  [facility] [phone] Hours: 8AM-5:30PM  nsurance Accepted: All insurances EXCEPT AARP, Aetna,  [employer], and Humana [facility]: [phone] Hours: 8AM-8PM Insurance Accepted: Aetna, BCBS, Cigna, [employer], Medicaid, Medicare, Uninsured [facility]: [phone] [facility]: [phone] Hours: 8:30AM-7PM Insurance Accepted: Aetna, BCBS, Medicaid, Medicare, Tricare, United Healthcare [facility]:  [phone]   Hours:9AM-5PM Insurance Accepted:  Aetna, BCBS, [facility], Medicaid, United Health Care  [facility]: [phone] Hours: 9AM-5:30PM Insurance Accepted: AARP, Aetna, BCBS, Cigna, and Medicaid, Medicare, United Health Care [facility]:  [phone] Hours: 9am-5pm Insurance Accepted: BCBS; they do not accept Medicaid/Medicare [facility]: [phone] Hours: 9am-9pm Insurance Accepted: All major insurance including Medicaid and Medicare [facility]: [phone] Hours: 9AM- 5PM Insurance Accepted: All insurances EXCEPT Medicaid and Medicare. [facility]: [phone]   ____________                                                                       Parenting Support Groups [facility]: [phone] [facility]:  [phone] Family Support Network: (support for children in the NICU and/or with special needs), [phone]   ___________                                                                 Mental Health Support Groups Mental Health Association: [phone]    _____________                                                                                  Online Resources Postpartum Support International: http://www.postpartum.net/  800-944-4PPD 2Moms Supporting Moms:  www.momssupportingmoms.net    

## 2023-01-17 ENCOUNTER — Other Ambulatory Visit: Payer: Self-pay | Admitting: Obstetrics & Gynecology

## 2023-01-17 ENCOUNTER — Encounter (HOSPITAL_BASED_OUTPATIENT_CLINIC_OR_DEPARTMENT_OTHER): Payer: Self-pay | Admitting: Obstetrics & Gynecology

## 2023-01-17 ENCOUNTER — Ambulatory Visit (HOSPITAL_BASED_OUTPATIENT_CLINIC_OR_DEPARTMENT_OTHER): Payer: Medicaid Other | Admitting: Obstetrics & Gynecology

## 2023-01-17 VITALS — BP 108/78 | HR 79 | Wt 123.0 lb

## 2023-01-17 DIAGNOSIS — O26892 Other specified pregnancy related conditions, second trimester: Secondary | ICD-10-CM

## 2023-01-17 DIAGNOSIS — Z3480 Encounter for supervision of other normal pregnancy, unspecified trimester: Secondary | ICD-10-CM

## 2023-01-17 DIAGNOSIS — Z3A15 15 weeks gestation of pregnancy: Secondary | ICD-10-CM

## 2023-01-17 DIAGNOSIS — Z6791 Unspecified blood type, Rh negative: Secondary | ICD-10-CM

## 2023-01-17 NOTE — Progress Notes (Signed)
   PRENATAL VISIT NOTE  Subjective:  Mary Cochran is a 27 y.o. G3P2002 at [redacted]w[redacted]d being seen today for ongoing prenatal care.  She is currently monitored for the following issues for this low-risk pregnancy and has Supervision of other normal pregnancy, antepartum; Gilbert syndrome; Nausea and vomiting during pregnancy; Gastroesophageal reflux during pregnancy, antepartum, first trimester; and Constipation during pregnancy in first trimester on their problem list.  Patient reports  some pelvic pressure.  No bleeding or leaking fluid .  Contractions: Not present. Vag. Bleeding: None.  Movement: Present. Denies leaking of fluid.   The following portions of the patient's history were reviewed and updated as appropriate: allergies, current medications, past family history, past medical history, past social history, past surgical history and problem list.   Objective:   Vitals:   01/17/23 1011  BP: 108/78  Pulse: 79  Weight: 123 lb (55.8 kg)    Fetal Status: Fetal Heart Rate (bpm): 136   Movement: Present     General:  Alert, oriented and cooperative. Patient is in no acute distress.  Skin: Skin is warm and dry. No rash noted.   Cardiovascular: Normal heart rate noted  Respiratory: Normal respiratory effort, no problems with respiration noted  Abdomen: Soft, gravid, appropriate for gestational age.  Pain/Pressure: Present     Pelvic: Chaperone preset, cervix visualized and is long and closed  Extremities: Normal range of motion.  Edema: None  Mental Status: Normal mood and affect. Normal behavior. Normal judgment and thought content.   Assessment and Plan:  Pregnancy: G3P2002 at [redacted]w[redacted]d 1. Supervision of other normal pregnancy, antepartum (Primary) - on PNV - AFP, Serum, Open Spina Bifida  2. [redacted] weeks gestation of pregnancy - recheck 4 weeks - anatomy scan scheduled  3. Gilbert syndrome  4. Rh negative status during pregnancy in second trimester - discussed rhogram  Preterm  labor symptoms and general obstetric precautions including but not limited to vaginal bleeding, contractions, leaking of fluid and fetal movement were reviewed in detail with the patient. Please refer to After Visit Summary for other counseling recommendations.   Return in about 4 weeks (around 02/14/2023).  Future Appointments  Date Time Provider Department Center  02/14/2023  9:15 AM [facility] NURSE [facility]  02/14/2023  9:30 AM [facility] US2 [facility]  02/15/2023  9:15 AM Lo, Toma Aran, CNM [facility]  03/14/2023  9:15 AM Lo, Toma Aran, CNM [facility]  04/11/2023  8:35 AM Lo, Toma Aran, CNM [facility]  05/02/2023  9:15 AM Jerene Bears, MD [facility]  05/16/2023  9:15 AM Lo, Toma Aran, CNM [facility]  05/30/2023  9:15 AM Lo, Toma Aran, CNM [facility]  06/13/2023  9:15 AM Jerene Bears, MD [facility]  06/20/2023  9:15 AM Lo, Toma Aran, CNM [facility]  06/27/2023  8:55 AM Lo, Toma Aran, CNM [facility]  07/04/2023  9:15 AM Lo, Toma Aran, CNM [facility]  07/11/2023  9:15 AM Lo, Toma Aran, CNM [facility]    Jerene Bears, MD

## 2023-01-17 NOTE — Progress Notes (Deleted)
   PRENATAL VISIT NOTE  Subjective:  Mary Cochran is a 27 y.o. G3P2002 at [redacted]w[redacted]d being seen today for ongoing prenatal care.  She is currently monitored for the following issues for this {Blank single:19197::"high-risk","low-risk"} pregnancy and has Supervision of other normal pregnancy, antepartum; Gilbert syndrome; Nausea and vomiting during pregnancy; Gastroesophageal reflux during pregnancy, antepartum, first trimester; and Constipation during pregnancy in first trimester on their problem list.  Patient reports {sx:14538}.  Contractions: Not present. Vag. Bleeding: None.  Movement: Present. Denies leaking of fluid.   The following portions of the patient's history were reviewed and updated as appropriate: allergies, current medications, past family history, past medical history, past social history, past surgical history and problem list.   Objective:   Vitals:   01/17/23 1011  BP: 108/78  Pulse: 79  Weight: 123 lb (55.8 kg)    Fetal Status: Fetal Heart Rate (bpm): 136   Movement: Present     General:  Alert, oriented and cooperative. Patient is in no acute distress.  Skin: Skin is warm and dry. No rash noted.   Cardiovascular: Normal heart rate noted  Respiratory: Normal respiratory effort, no problems with respiration noted  Abdomen: Soft, gravid, appropriate for gestational age.  Pain/Pressure: Present     Pelvic: {Blank single:19197::"Cervical exam performed in the presence of a chaperone","Cervical exam deferred"}        Extremities: Normal range of motion.  Edema: None  Mental Status: Normal mood and affect. Normal behavior. Normal judgment and thought content.   Assessment and Plan:  Pregnancy: G3P2002 at [redacted]w[redacted]d 1. Supervision of other normal pregnancy, antepartum (Primary) *** - AFP, Serum, Open Spina Bifida   {Blank single:19197::"Term","Preterm"} labor symptoms and general obstetric precautions including but not limited to vaginal bleeding, contractions, leaking  of fluid and fetal movement were reviewed in detail with the patient. Please refer to After Visit Summary for other counseling recommendations.   No follow-ups on file.  Future Appointments  Date Time Provider Department Center  02/14/2023  9:15 AM [facility] NURSE [facility] [place]  02/14/2023  9:30 AM [facility] US2 [facility]  02/15/2023  9:15 AM Lo, Toma Aran, CNM [facility]  03/14/2023  9:15 AM Lo, Toma Aran, CNM [facility]  04/11/2023  8:35 AM Lo, Toma Aran, CNM [facility]  05/02/2023  9:15 AM Jerene Bears, MD [facility]  05/16/2023  9:15 AM Lo, Toma Aran, CNM [facility]  05/30/2023  9:15 AM Lo, Toma Aran, CNM [facility]  06/13/2023  9:15 AM Jerene Bears, MD [facility]  06/20/2023  9:15 AM Lo, Toma Aran, CNM [facility]  06/27/2023  8:55 AM Lo, Toma Aran, CNM [facility]  07/04/2023  9:15 AM Lo, Toma Aran, CNM [facility]  07/11/2023  9:15 AM Lo, Toma Aran, CNM [facility]    Ralene Muskrat, PA-C

## 2023-01-19 LAB — AFP, SERUM, OPEN SPINA BIFIDA
AFP MoM: 1.66
AFP Value: 55.1 ng/mL
Gest. Age on Collection Date: 15 wk
Maternal Age At EDD: 30.2 a
OSBR Risk 1 IN: 1802
Test Results:: NEGATIVE
Weight: 123 [lb_av]

## 2023-01-23 ENCOUNTER — Ambulatory Visit: Payer: Medicaid Other | Admitting: Physician Assistant

## 2023-02-06 NOTE — L&D Delivery Note (Addendum)
 OB/GYN Faculty Practice Delivery Note  Mary Cochran is a 28 y.o. [id number] at [redacted]w[redacted]d admitted for DFM at term.   GBS Status: Positive/-- (05/08 1117) Maximum Maternal Temperature: 98.4  Labor course: Initial SVE: 2 cm. Augmentation with: AROM and Nipple Stimulation. She then progressed to complete.  ROM: 12h 77m with clear fluid  Birth: At 0343 a viable female was delivered via spontaneous vaginal delivery in the water tub (Presentation: OA). Nuchal cord present: No.  Shoulders and body delivered in usual fashion. Infant placed directly on mom's abdomen for bonding/skin-to-skin, baby dried and stimulated. Cord clamped x 2 after 2 minutes and cut by FOB, Thurston Flow.  Baby transferred to FOB's chest and mom escorted out of water tub with assistance from RN & CNM. Patient on bed for delivery of placenta. Cord blood collected.  The placenta separated spontaneously and delivered via gentle cord traction.  Pitocin  infused rapidly IV per protocol.  Fundus firm with massage.  Placenta inspected and appears to be intact with a 3 VC.  Placenta/Cord with no complications.  Cord pH: n/a Sponge and instrument count were correct x2.  Intrapartum complications:  None Anesthesia:  none Episiotomy: none Lacerations:  2nd degree perineal Suture Repair: 3.0 vicryl and 4.0 vicryl EBL (mL): 156   Infant: APGAR (1 MIN): 8  APGAR (5 MINS): 9  Infant weight: 7 lbs 6.9 oz (3370 gm)  Mom to postpartum.  Baby to Couplet care / Skin to Skin. Placenta to Home with patient   Plans to Breastfeed Contraception: vasectomy Circumcision: declines  Note sent to [facility]: [facility] for pp visit.  Thayer Embleton , MSN, CNM 07/07/2023 5:17 AM

## 2023-02-10 ENCOUNTER — Encounter (HOSPITAL_BASED_OUTPATIENT_CLINIC_OR_DEPARTMENT_OTHER): Payer: Self-pay | Admitting: Obstetrics & Gynecology

## 2023-02-12 ENCOUNTER — Other Ambulatory Visit (HOSPITAL_BASED_OUTPATIENT_CLINIC_OR_DEPARTMENT_OTHER): Payer: Self-pay | Admitting: Obstetrics & Gynecology

## 2023-02-12 DIAGNOSIS — M6289 Other specified disorders of muscle: Secondary | ICD-10-CM

## 2023-02-14 ENCOUNTER — Ambulatory Visit: Payer: Medicaid Other | Admitting: *Deleted

## 2023-02-14 ENCOUNTER — Other Ambulatory Visit: Payer: Self-pay

## 2023-02-14 ENCOUNTER — Ambulatory Visit: Payer: Medicaid Other | Attending: Obstetrics & Gynecology

## 2023-02-14 ENCOUNTER — Encounter: Payer: Self-pay | Admitting: *Deleted

## 2023-02-14 VITALS — BP 107/50 | HR 88

## 2023-02-14 DIAGNOSIS — O321XX0 Maternal care for breech presentation, not applicable or unspecified: Secondary | ICD-10-CM | POA: Insufficient documentation

## 2023-02-14 DIAGNOSIS — Z363 Encounter for antenatal screening for malformations: Secondary | ICD-10-CM | POA: Insufficient documentation

## 2023-02-14 DIAGNOSIS — Z3689 Encounter for other specified antenatal screening: Secondary | ICD-10-CM | POA: Diagnosis present

## 2023-02-14 DIAGNOSIS — Z3A19 19 weeks gestation of pregnancy: Secondary | ICD-10-CM | POA: Diagnosis not present

## 2023-02-14 DIAGNOSIS — Z3480 Encounter for supervision of other normal pregnancy, unspecified trimester: Secondary | ICD-10-CM | POA: Insufficient documentation

## 2023-02-14 NOTE — Therapy (Signed)
 OUTPATIENT PHYSICAL THERAPY FEMALE PELVIC EVALUATION   Patient Name: Mary Cochran MRN: [id number] DOB:1995/09/17, 28 y.o., female Today's Date: 02/15/2023  END OF SESSION:  PT End of Session - 02/15/23 1059     Visit Number 1    Authorization Type [place] MEDICAID [employer]  No auth req    PT Start Time 1000    PT Stop Time 1055    PT Time Calculation (min) 55 min    Activity Tolerance Patient tolerated treatment well    Behavior During Therapy WFL for tasks assessed/performed             Past Medical History:  Diagnosis Date   Adenomatous polyp of colon 10/04/2015   ALLERGIC RHINITIS 12/12/2006   Allergy    SEASONAL   Anxiety    Attention and concentration deficit 06/08/2021   Depression 10/03/2011   GILBERT'S SYNDROME 03/07/2010   Headache    Migraines   IBS (irritable bowel syndrome) 10/04/2015   INSOMNIA-SLEEP DISORDER-UNSPEC 04/18/2007   Nonallopathic lesion of lumbosacral region 10/26/2015   Nonallopathic lesion of sacral region 10/26/2015   Nonallopathic lesion of thoracic region 10/26/2015   SYNCOPE 10/07/2008   Qualifier: Diagnosis of   By: Norleen MD, [place] ORN        Tubular adenoma of colon 10/2014   Vertigo 04/06/2012   Past Surgical History:  Procedure Laterality Date   COLONOSCOPY  2016   MOUTH SURGERY     TONSILLECTOMY     Patient Active Problem List   Diagnosis Date Noted   Constipation during pregnancy in first trimester 12/05/2022   Supervision of other normal pregnancy, antepartum 11/26/2022   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   Nausea and vomiting during pregnancy 11/26/2022   Gastroesophageal reflux during pregnancy, antepartum, first trimester 11/26/2022    PCP: Job Lukes, PA   REFERRING PROVIDER: Cleotilde Ronal RAMAN, MD  REFERRING DIAG: M62.89 (ICD-10-CM) - Pelvic floor dysfunction   THERAPY DIAG:  Pelvic pain  Cervicalgia  Abnormal posture  Muscle weakness (generalized)  Other low back pain  Rationale for  Evaluation and Treatment: Rehabilitation  ONSET DATE: 2021  SUBJECTIVE:                                                                                                                                                                                           SUBJECTIVE STATEMENT: Pt reports that there is deep pressure in her vagina with standing and walking. 5-10 mins walk Pt [redacted] weeks pregnant This is her 3rd child 3 yo daughter and 62 yo sone. This baby is a boy Pelvic PT after first baby - sharp pelvic pain Has  SUI- started after her 28 yo, getting worse Pelvic pressure started about a month ago Pt reports that she has a bulging disc, has had PT for her back and sees a chiropractor OB says that her cervix is low   Fluid intake: Yes:      PAIN:  Are you having pain? Yes NPRS scale: 8/10 Pain location: Internal, Deep, and Vaginal  Pain type: aching and sharp Pain description: constant  but worse on her feet  Aggravating factors: walking, standing Relieving factors: getting off feet, sitting down, bath, putting feet up  PRECAUTIONS: None  RED FLAGS: None   WEIGHT BEARING RESTRICTIONS: No  FALLS:  Has patient fallen in last 6 months? 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  PLOF: Independent  PATIENT GOALS: less pain and pressure and leaking  PERTINENT HISTORY:  Low back pain, sciatica Sexual abuse: No  BOWEL MOVEMENT: no issues, so straining   URINATION: Pain with urination: No but  a lot of pressure when she pees Fully empty bladder: Yes:   Stream: Strong Urgency: Yes:   Frequency: yes Leakage: Coughing and Sneezing Pads: No  INTERCOURSE: Pain with intercourse: Initial Penetration but has not had much sex recently Ability to have vaginal penetration:  Yes:   Climax: yes Marinoff Scale:  1/3  PREGNANCY: Vaginal deliveries 2 Tearing Yes:   C-section deliveries 0 Currently pregnant Yes: 19 weeks  PROLAPSE: None   OBJECTIVE:  Note: Objective measures were completed at Evaluation unless otherwise noted.   COGNITION: Overall cognitive status: Within functional limits for tasks assessed     SENSATION: Light touch: Appears intact Proprioception: Appears intact  MUSCLE LENGTH: Hamstrings: Right 90 deg; Left 90 deg   LUMBAR SPECIAL TESTS:  Straight leg raise test: Negative    POSTURE: rounded shoulders, forward head, increased lumbar lordosis, and anterior pelvic tilt  PELVIC ALIGNMENT: even  LUMBARAROM/PROM: within functional limitations    LOWER EXTREMITY ROM: within functional limitations   LOWER EXTREMITY MMT: bilateral hip and knee 4/5 PALPATION:   General  upper chest breathing strategies                External Perineal Exam to be assessed as needed                             Internal Pelvic Floor to be assessed as needed  Patient confirms identification and approves PT to assess internal pelvic floor and treatment Yes  PELVIC MMT:   MMT eval  Vaginal   Internal Anal Sphincter   External Anal Sphincter   Puborectalis   Diastasis Recti   (Blank rows = not tested)        TONE: To be assessed  PROLAPSE: To be assessed  TODAY'S TREATMENT:  DATE: 02/15/23   EVAL see below   PATIENT EDUCATION:  Education details: relevant anatomy, pressure management strategies, HEP, posture Person educated: Patient Education method: Explanation, Demonstration, Tactile cues, Verbal cues, and Handouts Education comprehension: verbalized understanding and needs further education  HOME EXERCISE PROGRAM: [id number]  Neuro reed- transverse abdominis breath with ball press- seated , unilat, shoulder extensions with blue thera  band     ASSESSMENT:  CLINICAL IMPRESSION: Patient is a 28 y.o. F who was seen today for physical therapy evaluation and treatment for pelvic pressure and SUI. Pt presents with hypermobility throughout back and hips, bilateral hip weakness. She was nauseated first trimester and in bed a lot, got weaker. Pt typically active with yoga and pilates.  Pt with some breath holding strategies with exercises and core weakness which contributes to pelvic pressure. She will benefit from physical therapy, did well today with initial exercises with decreased vaginal pressure.   OBJECTIVE IMPAIRMENTS: decreased coordination, difficulty walking, decreased strength, and pain.   ACTIVITY LIMITATIONS: carrying, bending, standing, squatting, stairs, and caring for others  PARTICIPATION LIMITATIONS: cleaning, laundry, and community activity  PERSONAL FACTORS: Fitness and Time since onset of injury/illness/exacerbation are also affecting patient's functional outcome.   REHAB POTENTIAL: Good  CLINICAL DECISION MAKING: Stable/uncomplicated  EVALUATION COMPLEXITY: Low   GOALS: Goals reviewed with patient? Yes  SHORT TERM GOALS: Target date: 03/15/2023    Pt will be I with her HEP Baseline: Goal status: INITIAL  2.  Pt will report reducing vaginal pressure Baseline:  Goal status: INITIAL  3.  Pt will be I with pressure management strategies.  Baseline:  Goal status: INITIAL   LONG TERM GOALS: Target date: 07/16/2023  Pt will soak 0 underwear/ day Baseline:  Goal status: INITIAL  2.  Pt will be I with advanced HEP Baseline:  Goal status: INITIAL  3.  Pt will report 0/10 pelvic pain and pressure Baseline:  Goal status: INITIAL  4.  Pt will be able to walk, sit, carry and transfer without limitations Baseline:  Goal status: INITIAL  PLAN:  PT FREQUENCY: 1-2x/week  PT DURATION: 6 months  PLANNED INTERVENTIONS: 97110-Therapeutic exercises, 97530- Therapeutic activity, 97112-  Neuromuscular re-education, 97535- Self Care, [id number]- Manual therapy, [phone]- Electrical stimulation (manual), Taping, Dry Needling, Joint mobilization, Joint manipulation, Spinal manipulation, Spinal mobilization, Scar mobilization, Moist heat, and Biofeedback  PLAN FOR NEXT SESSION: internal pelvic floor muscles assessment  Michaline Kindig, PT 02/15/23 3:48 PM

## 2023-02-15 ENCOUNTER — Ambulatory Visit (HOSPITAL_BASED_OUTPATIENT_CLINIC_OR_DEPARTMENT_OTHER): Payer: Medicaid Other | Admitting: Certified Nurse Midwife

## 2023-02-15 ENCOUNTER — Encounter: Payer: Self-pay | Admitting: Physical Therapy

## 2023-02-15 ENCOUNTER — Ambulatory Visit: Payer: Medicaid Other | Attending: Obstetrics & Gynecology | Admitting: Physical Therapy

## 2023-02-15 VITALS — BP 100/60 | HR 78 | Wt 130.8 lb

## 2023-02-15 DIAGNOSIS — Z3480 Encounter for supervision of other normal pregnancy, unspecified trimester: Secondary | ICD-10-CM

## 2023-02-15 DIAGNOSIS — M5459 Other low back pain: Secondary | ICD-10-CM | POA: Insufficient documentation

## 2023-02-15 DIAGNOSIS — M6281 Muscle weakness (generalized): Secondary | ICD-10-CM | POA: Insufficient documentation

## 2023-02-15 DIAGNOSIS — Z6741 Type O blood, Rh negative: Secondary | ICD-10-CM

## 2023-02-15 DIAGNOSIS — R293 Abnormal posture: Secondary | ICD-10-CM | POA: Diagnosis not present

## 2023-02-15 DIAGNOSIS — Z3A19 19 weeks gestation of pregnancy: Secondary | ICD-10-CM

## 2023-02-15 DIAGNOSIS — R102 Pelvic and perineal pain: Secondary | ICD-10-CM | POA: Insufficient documentation

## 2023-02-15 DIAGNOSIS — M542 Cervicalgia: Secondary | ICD-10-CM | POA: Insufficient documentation

## 2023-02-15 DIAGNOSIS — M6289 Other specified disorders of muscle: Secondary | ICD-10-CM | POA: Insufficient documentation

## 2023-02-15 NOTE — Progress Notes (Signed)
    PRENATAL VISIT NOTE  Subjective:  Mary Cochran is a 28 y.o. G3P2002 at [redacted]w[redacted]d being seen today for ongoing prenatal care.  She is currently monitored for the following issues for this low-risk pregnancy and has Supervision of other normal pregnancy, antepartum; Gilbert syndrome; and Gastroesophageal reflux during pregnancy, antepartum, first trimester on their problem list.  Patient reports no complaints.  Contractions: Not present. Vag. Bleeding: None.  Movement: Present. Denies leaking of fluid.   The following portions of the patient's history were reviewed and updated as appropriate: allergies, current medications, past family history, past medical history, past social history, past surgical history and problem list.   Objective:   Vitals:   02/15/23 0918  BP: 100/60  Pulse: 78  Weight: 130 lb 12.8 oz (59.3 kg)    Fetal Status:     Movement: Present     General:  Alert, oriented and cooperative. Patient is in no acute distress.  Skin: Skin is warm and dry. No rash noted.   Cardiovascular: Normal heart rate noted  Respiratory: Normal respiratory effort, no problems with respiration noted  Abdomen: Soft, gravid, appropriate for gestational age.  Pain/Pressure: Present (Pressure in pelvic area)     Pelvic: Cervical exam deferred        Extremities: Normal range of motion.  Edema: Trace (hands/feet)  Mental Status: Normal mood and affect. Normal behavior. Normal judgment and thought content.   Assessment and Plan:  Pregnancy: G3P2002 at [redacted]w[redacted]d 1. Supervision of other normal pregnancy, antepartum (Primary) - Pt feels well. Nausea/vomiting/constipation resolved.  Preterm labor symptoms and general obstetric precautions including but not limited to vaginal bleeding, contractions, leaking of fluid and fetal movement were reviewed in detail with the patient. Please refer to After Visit Summary for other counseling recommendations.   No follow-ups on file.  Future  Appointments  Date Time Provider Department Center  02/22/2023 11:00 AM Helmus, Alondra [place], PT [facility] None  03/14/2023  9:15 AM Burnis Kaser, Arland POUR, CNM [facility]  04/11/2023  8:35 AM Sullivan Jacuinde, Arland POUR, CNM [facility]  04/25/2023  9:30 AM Helmus, Jitka, PT [facility] None  05/02/2023  9:15 AM Cleotilde Ronal RAMAN, MD [facility]  05/02/2023 11:45 AM Helmus, Jitka, PT [facility] None  05/16/2023  9:15 AM Antonya Leeder, Arland POUR, CNM [facility]  05/30/2023  9:15 AM Tuff Clabo, Arland POUR, CNM [facility]  06/13/2023  9:15 AM Cleotilde Ronal RAMAN, MD [facility]  06/20/2023  9:15 AM Tahirih Lair, Arland POUR, CNM [facility]  06/27/2023  8:55 AM Searcy Miyoshi, Arland POUR, CNM [facility]  07/04/2023  9:15 AM Brunella Wileman, Arland POUR, CNM [facility]  07/11/2023  9:15 AM Winnie Umali, Arland POUR, CNM [facility]    Arland POUR Roller, CNM

## 2023-02-19 ENCOUNTER — Encounter: Payer: Self-pay | Admitting: Physical Therapy

## 2023-02-19 ENCOUNTER — Ambulatory Visit: Payer: Medicaid Other | Admitting: Physical Therapy

## 2023-02-19 DIAGNOSIS — R293 Abnormal posture: Secondary | ICD-10-CM

## 2023-02-19 DIAGNOSIS — R102 Pelvic and perineal pain: Secondary | ICD-10-CM

## 2023-02-19 DIAGNOSIS — M542 Cervicalgia: Secondary | ICD-10-CM | POA: Diagnosis not present

## 2023-02-19 DIAGNOSIS — M5459 Other low back pain: Secondary | ICD-10-CM

## 2023-02-19 DIAGNOSIS — M546 Pain in thoracic spine: Secondary | ICD-10-CM

## 2023-02-19 DIAGNOSIS — M6281 Muscle weakness (generalized): Secondary | ICD-10-CM

## 2023-02-19 NOTE — Therapy (Signed)
 OUTPATIENT PHYSICAL THERAPY FEMALE PELVIC TREATMENT   Patient Name: Mary Cochran MRN: [id number] DOB:11-06-1995, 28 y.o., female Today's Date: 02/19/2023  END OF SESSION:  PT End of Session - 02/19/23 1445     Visit Number 2    Authorization Type  MEDICAID [employer]  No auth req    PT Start Time 1400    PT Stop Time 1445    PT Time Calculation (min) 45 min    Activity Tolerance Patient tolerated treatment well    Behavior During Therapy WFL for tasks assessed/performed              Past Medical History:  Diagnosis Date   Adenomatous polyp of colon 10/04/2015   ALLERGIC RHINITIS 12/12/2006   Allergy    SEASONAL   Anxiety    Attention and concentration deficit 06/08/2021   Depression 10/03/2011   GILBERT'S SYNDROME 03/07/2010   Headache    Migraines   IBS (irritable bowel syndrome) 10/04/2015   INSOMNIA-SLEEP DISORDER-UNSPEC 04/18/2007   Nonallopathic lesion of lumbosacral region 10/26/2015   Nonallopathic lesion of sacral region 10/26/2015   Nonallopathic lesion of thoracic region 10/26/2015   SYNCOPE 10/07/2008   Qualifier: Diagnosis of   By: Norleen MD, [place] ORN        Tubular adenoma of colon 10/2014   Vertigo 04/06/2012   Past Surgical History:  Procedure Laterality Date   COLONOSCOPY  2016   MOUTH SURGERY     TONSILLECTOMY     Patient Active Problem List   Diagnosis Date Noted   Type O blood, Rh negative 02/15/2023   Supervision of other normal pregnancy, antepartum 11/26/2022   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   Gastroesophageal reflux during pregnancy, antepartum, first trimester 11/26/2022    PCP: Job Lukes, PA   REFERRING PROVIDER: Cleotilde Ronal RAMAN, MD  REFERRING DIAG: M62.89 (ICD-10-CM) - Pelvic floor dysfunction   THERAPY DIAG:  Cervicalgia  Pelvic pain  Other low back pain  Abnormal posture  Muscle weakness (generalized)  Pain in thoracic spine  Rationale for Evaluation and Treatment:  Rehabilitation  ONSET DATE: 2021  SUBJECTIVE:                                                                                                                                                                                           SUBJECTIVE STATEMENT: Pt reports that she felt good after last visit.  Feels good today, had some increased pressure yesterday   Fluid intake: Yes:      PAIN:  Are you having pain? Yes NPRS scale: 8/10 Pain location: Internal, Deep, and Vaginal  Pain type: aching and sharp Pain  description: constant  but worse on her feet  Aggravating factors: walking, standing Relieving factors: getting off feet, sitting down, bath, putting feet up  PRECAUTIONS: None  RED FLAGS: None   WEIGHT BEARING RESTRICTIONS: No  FALLS:  Has patient fallen in last 6 months? 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  PLOF: Independent  PATIENT GOALS: less pain and pressure and leaking  PERTINENT HISTORY:  Low back pain, sciatica Sexual abuse: No  BOWEL MOVEMENT: no issues, so straining   URINATION: Pain with urination: No but  a lot of pressure when she pees Fully empty bladder: Yes:   Stream: Strong Urgency: Yes:   Frequency: yes Leakage: Coughing and Sneezing Pads: No  INTERCOURSE: Pain with intercourse: Initial Penetration but has not had much sex recently Ability to have vaginal penetration:  Yes:   Climax: yes Marinoff Scale: 1/3  PREGNANCY: Vaginal deliveries 2 Tearing Yes:   C-section deliveries 0 Currently pregnant Yes: 19 weeks  PROLAPSE: None   OBJECTIVE:  Note: Objective measures were completed at Evaluation unless otherwise noted.   COGNITION: Overall cognitive status: Within functional limits for tasks assessed     SENSATION: Light touch: Appears intact Proprioception:  Appears intact  MUSCLE LENGTH: Hamstrings: Right 90 deg; Left 90 deg   LUMBAR SPECIAL TESTS:  Straight leg raise test: Negative    POSTURE: rounded shoulders, forward head, increased lumbar lordosis, and anterior pelvic tilt  PELVIC ALIGNMENT: even  LUMBARAROM/PROM: within functional limitations    LOWER EXTREMITY ROM: within functional limitations   LOWER EXTREMITY MMT: bilateral hip and knee 4/5 PALPATION:    General  upper chest breathing strategies                External Perineal Exam within functional limitations                 Internal Pelvic Floor some tenderness superficially, tender perineal scar, decent pelvic floor strength, anterior vaginal wall laxity with cough checked in standing and in supine  Patient confirms identification and approves PT to assess internal pelvic floor and treatment Yes  PELVIC MMT:   MMT eval  Vaginal 4/5  Internal Anal Sphincter   External Anal Sphincter   Puborectalis   Diastasis Recti Yes- pregnant  (Blank rows = not tested)        TONE: mixed  PROLAPSE: Anterior vaginal wall laxity present  TODAY'S TREATMENT:                                                                                                                              DATE: 02/19/23   EVAL see below Manual- internal pelvic floor muscle assessment perineal massage performed and pt educated on    Neuro reed- horizontal abduction with theraband with transverse abdominis breath Ball press with transverse abdominis breath  PATIENT EDUCATION:  Education details: relevant anatomy, pressure management strategies, HEP, posture Person educated: Patient Education method: Explanation, Demonstration, Tactile cues, Verbal cues, and Handouts Education comprehension: verbalized understanding and needs further education  HOME EXERCISE PROGRAM: [id number]    ASSESSMENT:  CLINICAL IMPRESSION: Pt did well with internal and her exercises today. She will  continue to benefit from PT.   OBJECTIVE IMPAIRMENTS: decreased coordination, difficulty walking, decreased strength, and pain.   ACTIVITY LIMITATIONS: carrying, bending, standing, squatting, stairs, and caring for others  PARTICIPATION LIMITATIONS: cleaning, laundry, and community activity  PERSONAL FACTORS: Fitness and Time since onset of injury/illness/exacerbation are also affecting patient's functional outcome.   REHAB POTENTIAL: Good  CLINICAL DECISION MAKING: Stable/uncomplicated  EVALUATION COMPLEXITY: Low   GOALS: Goals reviewed with patient? Yes  SHORT TERM GOALS: Target date: 03/15/2023    Pt will be I with her HEP Baseline: Goal status: INITIAL  2.  Pt will report reducing vaginal pressure Baseline:  Goal status: INITIAL  3.  Pt will be I with pressure management strategies.  Baseline:  Goal status: INITIAL   LONG TERM GOALS: Target date: 07/16/2023  Pt will soak 0 underwear/ day Baseline:  Goal status: INITIAL  2.  Pt will be I with advanced HEP Baseline:  Goal status: INITIAL  3.  Pt will report 0/10 pelvic pain and pressure Baseline:  Goal status: INITIAL  4.  Pt will be able to walk, sit, carry and transfer without limitations Baseline:  Goal status: INITIAL  PLAN:  PT FREQUENCY: 1-2x/week  PT DURATION: 6 months  PLANNED INTERVENTIONS: 97110-Therapeutic exercises, 97530- Therapeutic activity, 97112- Neuromuscular re-education, 97535- Self Care, 02859- Manual therapy, [phone]- Electrical stimulation (manual), Taping, Dry Needling, Joint mobilization, Joint manipulation, Spinal manipulation, Spinal mobilization, Scar mobilization, Moist heat, and Biofeedback  PLAN FOR NEXT SESSION: core exercises Sadrac Zeoli, PT 02/19/23 2:47 PM

## 2023-02-21 ENCOUNTER — Other Ambulatory Visit (HOSPITAL_BASED_OUTPATIENT_CLINIC_OR_DEPARTMENT_OTHER): Payer: Self-pay | Admitting: Certified Nurse Midwife

## 2023-02-21 NOTE — Telephone Encounter (Signed)
Attempted to call pt in regards to rx refill.

## 2023-02-22 ENCOUNTER — Encounter: Payer: Medicaid Other | Admitting: Physical Therapy

## 2023-02-26 ENCOUNTER — Other Ambulatory Visit (HOSPITAL_BASED_OUTPATIENT_CLINIC_OR_DEPARTMENT_OTHER): Payer: Self-pay

## 2023-02-26 ENCOUNTER — Encounter: Payer: Self-pay | Admitting: Physical Therapy

## 2023-02-26 ENCOUNTER — Ambulatory Visit: Payer: Medicaid Other | Admitting: Physical Therapy

## 2023-02-26 ENCOUNTER — Encounter (HOSPITAL_BASED_OUTPATIENT_CLINIC_OR_DEPARTMENT_OTHER): Payer: Self-pay

## 2023-02-26 DIAGNOSIS — R293 Abnormal posture: Secondary | ICD-10-CM

## 2023-02-26 DIAGNOSIS — M5459 Other low back pain: Secondary | ICD-10-CM

## 2023-02-26 DIAGNOSIS — M542 Cervicalgia: Secondary | ICD-10-CM | POA: Diagnosis not present

## 2023-02-26 DIAGNOSIS — R102 Pelvic and perineal pain: Secondary | ICD-10-CM

## 2023-02-26 DIAGNOSIS — M546 Pain in thoracic spine: Secondary | ICD-10-CM

## 2023-02-26 DIAGNOSIS — M6281 Muscle weakness (generalized): Secondary | ICD-10-CM

## 2023-02-26 MED ORDER — DOXYLAMINE-PYRIDOXINE 10-10 MG PO TBEC: 2.0000 | DELAYED_RELEASE_TABLET | Freq: Every evening | ORAL | 0 refills | Status: DC

## 2023-02-26 MED ORDER — DICLEGIS 10-10 MG PO TBEC: 2.0000 | DELAYED_RELEASE_TABLET | Freq: Every evening | ORAL | 1 refills | Status: DC

## 2023-02-26 NOTE — Therapy (Addendum)
 OUTPATIENT PHYSICAL THERAPY FEMALE PELVIC TREATMENT/ later day discharge   Patient Name: Mary Cochran MRN: [id number] DOB:[birth year], 28 y.o., female Today's Date: 02/26/2023  END OF SESSION:     Past Medical History:  Diagnosis Date   Adenomatous polyp of colon 10/04/2015   ALLERGIC RHINITIS 12/12/2006   Allergy    SEASONAL   Anxiety    Attention and concentration deficit 06/08/2021   Depression 10/03/2011   GILBERT'S SYNDROME 03/07/2010   Headache    Migraines   IBS (irritable bowel syndrome) 10/04/2015   INSOMNIA-SLEEP DISORDER-UNSPEC 04/18/2007   Nonallopathic lesion of lumbosacral region 10/26/2015   Nonallopathic lesion of sacral region 10/26/2015   Nonallopathic lesion of thoracic region 10/26/2015   SYNCOPE 10/07/2008   Qualifier: Diagnosis of   By: Jonny Ruiz MD, Len Blalock        Tubular adenoma of colon 10/2014   Vertigo 04/06/2012   Past Surgical History:  Procedure Laterality Date   COLONOSCOPY  2016   MOUTH SURGERY     TONSILLECTOMY     Patient Active Problem List   Diagnosis Date Noted   Type O blood, Rh negative 02/15/2023   Supervision of other normal pregnancy, antepartum 11/26/2022   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  Rationale for Evaluation and Treatment: Rehabilitation  ONSET DATE: 2021  SUBJECTIVE:                                                                                                                                                                                           SUBJECTIVE STATEMENT: Pt reports that she felt a lot pressure this morning when she got up. No leaking with sneezing.    Fluid intake: Yes:      PAIN:  Are you having pain? Yes NPRS scale: 8/10 Pain  location: Internal, Deep, and Vaginal  Pain type: aching and sharp Pain description: constant  but worse on her feet  Aggravating factors: walking, standing Relieving factors: getting off feet, sitting down, bath, putting feet up  PRECAUTIONS: None  RED FLAGS: None   WEIGHT BEARING RESTRICTIONS: No  FALLS:  Has patient fallen in last 6 months? 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  PLOF: Independent  PATIENT GOALS: less pain and pressure and leaking  PERTINENT HISTORY:  Low back pain, sciatica Sexual abuse: No  BOWEL MOVEMENT: no issues, so straining   URINATION: Pain with urination: No but  a lot of pressure when she pees Fully empty bladder: Yes:   Stream: Strong Urgency: Yes:   Frequency: yes Leakage: Coughing and Sneezing Pads: No  INTERCOURSE: Pain with intercourse: Initial Penetration but has not had much sex recently Ability to have vaginal penetration:  Yes:   Climax: yes Marinoff Scale: 1/3  PREGNANCY: Vaginal deliveries 2 Tearing Yes:   C-section deliveries 0 Currently pregnant Yes: 19 weeks  PROLAPSE: None   OBJECTIVE:  Note: Objective measures were completed at Evaluation unless otherwise noted.   COGNITION: Overall cognitive status: Within functional limits for tasks assessed     SENSATION: Light touch: Appears intact Proprioception: Appears intact  MUSCLE LENGTH: Hamstrings: Right 90 deg; Left 90 deg   LUMBAR SPECIAL TESTS:  Straight leg raise test: Negative    POSTURE: rounded shoulders, forward head, increased lumbar lordosis, and anterior pelvic tilt  PELVIC ALIGNMENT: even  LUMBARAROM/PROM: within functional limitations    LOWER EXTREMITY ROM: within functional limitations   LOWER EXTREMITY MMT: bilateral hip and knee 4/5 PALPATION:    General  upper chest  breathing strategies                External Perineal Exam within functional limitations                 Internal Pelvic Floor some tenderness superficially, tender perineal scar, decent pelvic floor strength, anterior vaginal wall laxity with cough checked in standing and in supine  Patient confirms identification and approves PT to assess internal pelvic floor and treatment Yes  PELVIC MMT:   MMT eval  Vaginal 4/5  Internal Anal Sphincter   External Anal Sphincter   Puborectalis   Diastasis Recti Yes- pregnant  (Blank rows = not tested)        TONE: mixed  PROLAPSE: Anterior vaginal wall laxity present  TODAY'S TREATMENT:                                                                                                                              DATE: 02/26/23   EVAL see below   Neuro reed- shoulder extension with lat press with transverse abdominis breath 2 plates There acts- trial of Serola belt    PATIENT EDUCATION:  Education details: relevant anatomy, pressure management strategies, HEP, posture Person educated: Patient Education method: Programmer, multimedia, Demonstration, Tactile cues, Verbal cues, and Handouts Education comprehension: verbalized understanding and needs further education  HOME EXERCISE PROGRAM: [id number]    ASSESSMENT:  CLINICAL IMPRESSION: Pt did well with exercises and trial of serola belt, She got a little light headed and had to rest. O2 sats 97 and HR 88. Felt better aftre a few minutes. She said this is normal for her, has been having these episodes since 68-16 yo, She will continue to benefit from PT.   OBJECTIVE IMPAIRMENTS:  decreased coordination, difficulty walking, decreased strength, and pain.   ACTIVITY LIMITATIONS: carrying, bending, standing, squatting, stairs, and caring for others  PARTICIPATION LIMITATIONS: cleaning, laundry, and community activity  PERSONAL FACTORS: Fitness and Time since onset of injury/illness/exacerbation  are also affecting patient's functional outcome.   REHAB POTENTIAL: Good  CLINICAL DECISION MAKING: Stable/uncomplicated  EVALUATION COMPLEXITY: Low   GOALS: Goals reviewed with patient? Yes  SHORT TERM GOALS: Target date: 03/15/2023    Pt will be I with her HEP Baseline: Goal status: INITIAL  2.  Pt will report reducing vaginal pressure Baseline:  Goal status: INITIAL  3.  Pt will be I with pressure management strategies.  Baseline:  Goal status: INITIAL   LONG TERM GOALS: Target date: 07/16/2023  Pt will soak 0 underwear/ day Baseline:  Goal status: INITIAL  2.  Pt will be I with advanced HEP Baseline:  Goal status: INITIAL  3.  Pt will report 0/10 pelvic pain and pressure Baseline:  Goal status: INITIAL  4.  Pt will be able to walk, sit, carry and transfer without limitations Baseline:  Goal status: INITIAL  PLAN:  PT FREQUENCY: 1-2x/week  PT DURATION: 6 months  PLANNED INTERVENTIONS: 97110-Therapeutic exercises, 97530- Therapeutic activity, 97112- Neuromuscular re-education, 97535- Self Care, 16109- Manual therapy, [id number]- Electrical stimulation (manual), Taping, Dry Needling, Joint mobilization, Joint manipulation, Spinal manipulation, Spinal mobilization, Scar mobilization, Moist heat, and Biofeedback  PLAN FOR NEXT SESSION: core exercises Darin Redmann, PT 02/26/23 8:18 AM   PHYSICAL THERAPY DISCHARGE SUMMARY   Patient agrees to discharge. Patient goals were not met. Patient is being discharged due to not returning since the last visit.

## 2023-02-26 NOTE — Progress Notes (Signed)
Patient sent MyChart message stating her regular pharmacy ([facility]) would not get medication until tomorrow and suggested she call us and ask for 4 tablets be sent to [facility] pharmacy in the meanwhile. Rx was sent. Patient notified. tbw

## 2023-02-26 NOTE — Telephone Encounter (Signed)
Tried contacting patient via phone without success. No  VM. I will send a message through MyChart. tbw

## 2023-03-07 ENCOUNTER — Ambulatory Visit: Payer: Medicaid Other | Admitting: Physical Therapy

## 2023-03-09 ENCOUNTER — Encounter (HOSPITAL_BASED_OUTPATIENT_CLINIC_OR_DEPARTMENT_OTHER): Payer: Self-pay | Admitting: Obstetrics & Gynecology

## 2023-03-12 ENCOUNTER — Encounter: Payer: Self-pay | Admitting: Physical Therapy

## 2023-03-12 ENCOUNTER — Ambulatory Visit (INDEPENDENT_AMBULATORY_CARE_PROVIDER_SITE_OTHER): Payer: Medicaid Other | Admitting: Certified Nurse Midwife

## 2023-03-12 ENCOUNTER — Encounter (HOSPITAL_BASED_OUTPATIENT_CLINIC_OR_DEPARTMENT_OTHER): Payer: Self-pay | Admitting: Certified Nurse Midwife

## 2023-03-12 VITALS — BP 94/53 | HR 86 | Wt 134.0 lb

## 2023-03-12 DIAGNOSIS — N750 Cyst of Bartholin's gland: Secondary | ICD-10-CM

## 2023-03-12 DIAGNOSIS — Z3A22 22 weeks gestation of pregnancy: Secondary | ICD-10-CM | POA: Diagnosis not present

## 2023-03-12 DIAGNOSIS — Z3480 Encounter for supervision of other normal pregnancy, unspecified trimester: Secondary | ICD-10-CM | POA: Diagnosis not present

## 2023-03-12 NOTE — Progress Notes (Signed)
    PRENATAL VISIT NOTE  Subjective:  Mary Cochran is a 28 y.o. G3P2002 at 106w5d being seen today for ongoing prenatal care. Pt reports a hx of left bartholin gland cyst and reports that is feels larger today than before. She also feels like the right bartholin gland may be enlarged.    She is currently monitored for the following issues for this  pregnancy and has Supervision of other normal pregnancy, antepartum; Gilbert syndrome; Gastroesophageal reflux during pregnancy, antepartum, first trimester; Type O blood, Rh negative; and Cyst of Bartholin's gland on their problem list.  Patient reports no bleeding, no contractions, no cramping, and no leaking.  Contractions: Not present. Vag. Bleeding: None.  Movement: Present. Denies leaking of fluid.   The following portions of the patient's history were reviewed and updated as appropriate: allergies, current medications, past family history, past medical history, past social history, past surgical history and problem list.   Objective:   Vitals:   03/12/23 0943  BP: (!) 94/53  Pulse: 86  Weight: 134 lb (60.8 kg)    Fetal Status: Fetal Heart Rate (bpm): 140   Movement: Present     General:  Alert, oriented and cooperative. Patient is in no acute distress.  Skin: Skin is warm and dry. No rash noted.   Cardiovascular: Normal heart rate noted  Respiratory: Normal respiratory effort, no problems with respiration noted  Abdomen: Soft, gravid, appropriate for gestational age.  Pain/Pressure: Present     Pelvic: Bilaterally bartholin glands are very mildly enlarged but non-tender          Extremities: Normal range of motion.  Edema: Trace  Mental Status: Normal mood and affect. Normal behavior. Normal judgment and thought content.   Assessment and Plan:  Pregnancy: G3P2002 at [redacted]w[redacted]d 1. Supervision of other normal pregnancy, antepartum (Primary)  2. [redacted] weeks gestation of pregnancy   3. Cyst of Bartholin's gland - Continue warm  soaks in tub - Pt will call if glands become larger or painful  Preterm labor symptoms and general obstetric precautions including but not limited to vaginal bleeding, contractions, leaking of fluid and fetal movement were reviewed in detail with the patient. Please refer to After Visit Summary for other counseling recommendations.   No follow-ups on file.  Future Appointments  Date Time Provider Department Center  03/19/2023 12:30 PM Helmus, Jitka, PT [facility] None  03/25/2023 12:30 PM Helmus, Jitka, PT [facility] None  04/11/2023  8:35 AM Matson Welch, Arland POUR, CNM [facility]  04/25/2023  9:30 AM Helmus, Jitka, PT [facility] None  05/02/2023  9:15 AM Cleotilde Ronal RAMAN, MD [facility]  05/02/2023 11:45 AM Helmus, Jitka, PT [facility] None  05/16/2023  9:15 AM Maudy Yonan, Arland POUR, CNM [facility]  05/30/2023  9:15 AM Davien Malone, Arland POUR, CNM [facility]  06/13/2023  9:15 AM Cleotilde Ronal RAMAN, MD [facility]  06/20/2023  9:15 AM Emillee Talsma, Arland POUR, CNM [facility]  06/27/2023  8:55 AM Kysha Muralles, Arland POUR, CNM [facility]  07/04/2023  9:15 AM Prim Morace, Arland POUR, CNM [facility]  07/11/2023  9:15 AM Nnaemeka Samson, Arland POUR, CNM [facility]    Arland POUR Roller, CNM

## 2023-03-14 ENCOUNTER — Encounter (HOSPITAL_BASED_OUTPATIENT_CLINIC_OR_DEPARTMENT_OTHER): Payer: Medicaid Other | Admitting: Certified Nurse Midwife

## 2023-03-19 ENCOUNTER — Encounter: Payer: Self-pay | Admitting: Physical Therapy

## 2023-03-25 ENCOUNTER — Encounter: Payer: Self-pay | Admitting: Physical Therapy

## 2023-03-26 ENCOUNTER — Encounter (HOSPITAL_BASED_OUTPATIENT_CLINIC_OR_DEPARTMENT_OTHER): Payer: Self-pay | Admitting: Certified Nurse Midwife

## 2023-04-10 ENCOUNTER — Ambulatory Visit (HOSPITAL_BASED_OUTPATIENT_CLINIC_OR_DEPARTMENT_OTHER): Payer: Medicaid Other | Admitting: Certified Nurse Midwife

## 2023-04-10 VITALS — BP 106/65 | HR 81 | Wt 143.0 lb

## 2023-04-10 DIAGNOSIS — Z3A26 26 weeks gestation of pregnancy: Secondary | ICD-10-CM | POA: Diagnosis not present

## 2023-04-10 DIAGNOSIS — Z6741 Type O blood, Rh negative: Secondary | ICD-10-CM

## 2023-04-10 DIAGNOSIS — N750 Cyst of Bartholin's gland: Secondary | ICD-10-CM | POA: Diagnosis not present

## 2023-04-10 DIAGNOSIS — Z3480 Encounter for supervision of other normal pregnancy, unspecified trimester: Secondary | ICD-10-CM | POA: Diagnosis not present

## 2023-04-10 NOTE — Progress Notes (Signed)
    PRENATAL VISIT NOTE  Subjective:  Mary Cochran is a 28 y.o. G3P2002 at 26w being seen today for ongoing prenatal care.    She is currently monitored for the following issues for this  pregnancy and has Supervision of other normal pregnancy, antepartum; Gilbert syndrome; Gastroesophageal reflux during pregnancy, antepartum, first trimester; Type O blood, Rh negative; and Cyst of Bartholin's gland on their problem list.  Patient reports no bleeding, no contractions, no cramping, and no leaking.   .  .   . Denies leaking of fluid.   The following portions of the patient's history were reviewed and updated as appropriate: allergies, current medications, past family history, past medical history, past social history, past surgical history and problem list.   Objective:   There were no vitals filed for this visit.   Fetal Status:           General:  Alert, oriented and cooperative. Patient is in no acute distress.  Skin: Skin is warm and dry. No rash noted.   Cardiovascular: Normal heart rate noted  Respiratory: Normal respiratory effort, no problems with respiration noted  Abdomen: Soft, gravid, appropriate for gestational age.        Pelvic: Bilaterally bartholin glands are very mildly enlarged but non-tender          Extremities: Normal range of motion.     Mental Status: Normal mood and affect. Normal behavior. Normal judgment and thought content.   Assessment and Plan:  Pregnancy: G3P2002 at [redacted]w[redacted]d  1. Supervision of other normal pregnancy, antepartum (Primary) - Rhogam today (04/10/23) - 2hr GTT and routine labs collected  2. [redacted] weeks gestation of pregnancy   3. Cyst of Bartholin's gland - Continue warm soaks in tub - Pt will call if glands become larger or painful  Preterm labor symptoms and general obstetric precautions including but not limited to vaginal bleeding, contractions, leaking of fluid and fetal movement were reviewed in detail with the patient. Please  refer to After Visit Summary for other counseling recommendations.   No follow-ups on file.  Future Appointments  Date Time Provider Department Center  04/25/2023  9:30 AM Helmus, [place], [place] [facility] None  05/02/2023  9:15 AM Jerene Bears, MD [facility]  05/02/2023 11:45 AM Helmus, Jitka, PT [facility] None  05/16/2023  9:15 AM Javon Hupfer, Toma Aran, CNM [facility]  05/30/2023  9:15 AM Fontella Shan, Toma Aran, CNM [facility]  06/13/2023  9:15 AM Jerene Bears, MD [facility]  06/20/2023  9:15 AM Marton Redwood, Toma Aran, CNM [facility]  06/27/2023  8:55 AM Tessla Spurling, Toma Aran, CNM [facility]  07/04/2023  9:15 AM Kailand Seda, Toma Aran, CNM [facility]  07/11/2023  9:15 AM Roshard Rezabek, Toma Aran, CNM [facility]    Letta Kocher, CNM

## 2023-04-11 ENCOUNTER — Encounter (HOSPITAL_BASED_OUTPATIENT_CLINIC_OR_DEPARTMENT_OTHER): Payer: Self-pay | Admitting: Certified Nurse Midwife

## 2023-04-11 ENCOUNTER — Encounter (HOSPITAL_BASED_OUTPATIENT_CLINIC_OR_DEPARTMENT_OTHER): Payer: Medicaid Other | Admitting: Certified Nurse Midwife

## 2023-04-11 LAB — ANTIBODY SCREEN: Antibody Screen: NEGATIVE

## 2023-04-11 LAB — COMPREHENSIVE METABOLIC PANEL
ALT: 14 IU/L (ref 0–32)
AST: 22 IU/L (ref 0–40)
Albumin: 3.6 g/dL — ABNORMAL LOW (ref 4.0–5.0)
Alkaline Phosphatase: 71 IU/L (ref 44–121)
BUN/Creatinine Ratio: 13 (ref 9–23)
BUN: 8 mg/dL (ref 6–20)
Bilirubin Total: 0.6 mg/dL (ref 0.0–1.2)
CO2: 20 mmol/L (ref 20–29)
Calcium: 8.8 mg/dL (ref 8.7–10.2)
Chloride: 102 mmol/L (ref 96–106)
Creatinine, Ser: 0.6 mg/dL (ref 0.57–1.00)
Globulin, Total: 2.6 g/dL (ref 1.5–4.5)
Glucose: 71 mg/dL (ref 70–99)
Potassium: 4 mmol/L (ref 3.5–5.2)
Sodium: 137 mmol/L (ref 134–144)
Total Protein: 6.2 g/dL (ref 6.0–8.5)
eGFR: 125 mL/min/{1.73_m2} (ref >59)

## 2023-04-11 LAB — CBC
Hematocrit: 36.1 % (ref 34.0–46.6)
Hemoglobin: 12.1 g/dL (ref 11.1–15.9)
MCH: 32.2 pg (ref 26.6–33.0)
MCHC: 33.5 g/dL (ref 31.5–35.7)
MCV: 96 fL (ref 79–97)
Platelets: 272 10*3/uL (ref 150–450)
RBC: 3.76 x10E6/uL — ABNORMAL LOW (ref 3.77–5.28)
RDW: 12.3 % (ref 11.7–15.4)
WBC: 8.7 10*3/uL (ref 3.4–10.8)

## 2023-04-11 LAB — GLUCOSE TOLERANCE, 2 HOURS W/ 1HR
Glucose, 1 hour: 129 mg/dL (ref 70–179)
Glucose, 2 hour: 77 mg/dL (ref 70–152)
Glucose, Fasting: 76 mg/dL (ref 70–91)

## 2023-04-11 LAB — HIV ANTIBODY (ROUTINE TESTING W REFLEX): HIV Screen 4th Generation wRfx: NONREACTIVE

## 2023-04-11 LAB — RPR: RPR Ser Ql: NONREACTIVE

## 2023-04-11 NOTE — Progress Notes (Signed)
   PRENATAL VISIT NOTE  Subjective:  Mary Cochran is a 28 y.o. G3P2002 at [redacted]w[redacted]d being seen today for ongoing prenatal care.  She is currently monitored for the following issues for this low-risk pregnancy and has Supervision of other normal pregnancy, antepartum; Gilbert syndrome; Gastroesophageal reflux during pregnancy, antepartum, first trimester; Type O blood, Rh negative; and Cyst of Bartholin's gland on their problem list.  Patient reports no bleeding, no contractions, no cramping, and no leaking.  Contractions: Not present. Vag. Bleeding: None.  Movement: Present. Denies leaking of fluid.   The following portions of the patient's history were reviewed and updated as appropriate: allergies, current medications, past family history, past medical history, past social history, past surgical history and problem list.   Objective:   Vitals:   04/10/23 0848  BP: 106/65  Pulse: 81  Weight: 143 lb (64.9 kg)    Fetal Status: Fetal Heart Rate (bpm): 144 Fundal Height: 26 cm Movement: Present     General:  Alert, oriented and cooperative. Patient is in no acute distress.  Skin: Skin is warm and dry. No rash noted.   Cardiovascular: Normal heart rate noted  Respiratory: Normal respiratory effort, no problems with respiration noted  Abdomen: Soft, gravid, appropriate for gestational age.  Pain/Pressure: Present     Pelvic: Cervical exam deferred        Extremities: Normal range of motion.  Edema: Trace  Mental Status: Normal mood and affect. Normal behavior. Normal judgment and thought content.   Assessment and Plan:  Pregnancy: G3P2002 at [redacted]w[redacted]d  1. [redacted] weeks gestation of pregnancy (Primary) - 2hr GTT and labs today - Declined Tdap  2. Supervision of other normal pregnancy, antepartum - Glucose Tolerance, 2 Hours w/1 Hour - CBC - HIV Antibody (routine testing w rflx) - RPR - Antibody screen  3. Cyst of Bartholin's gland - No current c/o  4. Type O blood, Rh negative - Pt  declines Rhogam today. She would like to confirm Spouse Blood Type. - Plan Rhogam at next RPV at 29 weeks  5. Sullivan Lone syndrome - Comp Met (CMET)  Preterm labor symptoms and general obstetric precautions including but not limited to vaginal bleeding, contractions, leaking of fluid and fetal movement were reviewed in detail with the patient. Please refer to After Visit Summary for other counseling recommendations.   No follow-ups on file.  Future Appointments  Date Time Provider Department Center  05/02/2023  9:15 AM Jerene Bears, MD [facility]  05/02/2023 11:45 AM Helmus, Jitka, PT [facility] None  05/16/2023  9:15 AM Vika Buske, Toma Aran, CNM [facility]  05/30/2023  9:15 AM Keneth Borg, Toma Aran, CNM [facility]  06/13/2023  9:15 AM Jerene Bears, MD [facility]  06/20/2023  9:15 AM Marton Redwood, Toma Aran, CNM [facility]  06/27/2023  8:55 AM Toan Mort, Toma Aran, CNM [facility]  07/04/2023  9:15 AM Marlo Goodrich, Toma Aran, CNM [facility]  07/11/2023  9:15 AM Vannary Greening, Toma Aran, CNM [facility]    Letta Kocher, CNM

## 2023-04-17 ENCOUNTER — Encounter (HOSPITAL_BASED_OUTPATIENT_CLINIC_OR_DEPARTMENT_OTHER): Payer: Self-pay | Admitting: Certified Nurse Midwife

## 2023-04-21 ENCOUNTER — Inpatient Hospital Stay (HOSPITAL_COMMUNITY)
Admission: AD | Admit: 2023-04-21 | Discharge: 2023-04-21 | Disposition: A | Discharge status: Home / Self Care | Attending: Obstetrics & Gynecology | Admitting: Obstetrics & Gynecology

## 2023-04-21 ENCOUNTER — Encounter (HOSPITAL_COMMUNITY): Payer: Self-pay | Admitting: Obstetrics & Gynecology

## 2023-04-21 DIAGNOSIS — O99283 Endocrine, nutritional and metabolic diseases complicating pregnancy, third trimester: Secondary | ICD-10-CM | POA: Insufficient documentation

## 2023-04-21 DIAGNOSIS — O368130 Decreased fetal movements, third trimester, not applicable or unspecified: Secondary | ICD-10-CM | POA: Insufficient documentation

## 2023-04-21 DIAGNOSIS — Z3A28 28 weeks gestation of pregnancy: Secondary | ICD-10-CM | POA: Diagnosis not present

## 2023-04-21 DIAGNOSIS — Z3689 Encounter for other specified antenatal screening: Secondary | ICD-10-CM | POA: Diagnosis not present

## 2023-04-21 DIAGNOSIS — O368131 Decreased fetal movements, third trimester, fetus 1: Secondary | ICD-10-CM

## 2023-04-21 NOTE — MAU Note (Signed)
.  Mary Cochran is a 28 y.o. at [redacted]w[redacted]d here in MAU reporting: pt reports she was very busy today and when she sat to rest she noted baby was not moving as per his routine. Denies SROM, vaginal bleeding or bloody show Reports only Braxton Hicks contraction External fetal and labor monitors applied-movement tracker explained to pt Audible and palpable movement noted   Onset of complaint: 1930 Pain score: None Vitals:   04/21/23 2227  BP: 116/73  Pulse: 98  Resp: 16  Temp: 98.3 F (36.8 C)  SpO2: 100%     FHT: 130  Lab orders placed from triage: NST

## 2023-04-21 NOTE — MAU Provider Note (Signed)
 MAU Provider Note  Chief Complaint: Decreased Fetal Movement   Event Date/Time   First Provider Initiated Contact with Patient 04/21/23 2254      SUBJECTIVE HPI: Mary Cochran is a 28 y.o. G3P2002 at [redacted]w[redacted]d by early ultrasound who presents to maternity admissions reporting DFM. Pregnancy c/b Rh neg, Gilbert syndrome. Receives [facility] with [facility].  Patient notes decreased fetal movement today.  She was busy moving around during the day and did not feel much movement.  However usually he will start to have more movement in the evenings and he did not.  She denies urinary symptoms, vaginal bleeding, loss of fluid, cramping.  Patient notes that baby has started moving significantly more after arrival to MAU.  HPI  Past Medical History:  Diagnosis Date   Adenomatous polyp of colon 10/04/2015   ALLERGIC RHINITIS 12/12/2006   Allergy    SEASONAL   Anxiety    Attention and concentration deficit 06/08/2021   Depression 10/03/2011   GILBERT'S SYNDROME 03/07/2010   Headache    Migraines   IBS (irritable bowel syndrome) 10/04/2015   INSOMNIA-SLEEP DISORDER-UNSPEC 04/18/2007   Nonallopathic lesion of lumbosacral region 10/26/2015   Nonallopathic lesion of sacral region 10/26/2015   Nonallopathic lesion of thoracic region 10/26/2015   SYNCOPE 10/07/2008   Qualifier: Diagnosis of   By: Jonny Ruiz MD, Len Blalock        Tubular adenoma of colon 10/2014   Vertigo 04/06/2012   Past Surgical History:  Procedure Laterality Date   COLONOSCOPY  2016   MOUTH SURGERY     TONSILLECTOMY     Social History   Socioeconomic History   Marital status: Married    Spouse name: Not on file   Number of children: Not on file   Years of education: Not on file   Highest education level: Not on file  Occupational History   Occupation: Engineer, drilling  Tobacco Use   Smoking status: Never   Smokeless tobacco: Never  Vaping Use   Vaping status: Never Used  Substance and Sexual Activity   Alcohol use:  Not Currently    Comment: OCC.   Drug use: No   Sexual activity: Yes    Birth control/protection: None  Other Topics Concern   Not on file  Social History Narrative   Not on file   Social Drivers of Health   Financial Resource Strain: Low Risk  (11/26/2022)   Overall Financial Resource Strain (CARDIA)    Difficulty of Paying Living Expenses: Not very hard  Food Insecurity: No Food Insecurity (11/26/2022)   Hunger Vital Sign    Worried About Running Out of Food in the Last Year: Never true    Ran Out of Food in the Last Year: Never true  Transportation Needs: No Transportation Needs (11/26/2022)   PRAPARE - Administrator, Civil Service (Medical): No    Lack of Transportation (Non-Medical): No  Physical Activity: Insufficiently Active (11/26/2022)   Exercise Vital Sign    Days of Exercise per Week: 2 days    Minutes of Exercise per Session: 50 min  Stress: Stress Concern Present (11/26/2022)   [employer] of Occupational Health - Occupational Stress Questionnaire    Feeling of Stress : Rather much  Social Connections: Moderately Isolated (11/26/2022)   Social Connection and Isolation Panel [NHANES]    Frequency of Communication with Friends and Family: Twice a week    Frequency of Social Gatherings with Friends and Family: Once a week  Attends Religious Services: Never    Active Member of Clubs or Organizations: No    Attends Banker Meetings: Never    Marital Status: Married  Catering manager Violence: Not At Risk (11/26/2022)   Humiliation, Afraid, Rape, and Kick questionnaire    Fear of Current or Ex-Partner: No    Emotionally Abused: No    Physically Abused: No    Sexually Abused: No   No current facility-administered medications on file prior to encounter.   Current Outpatient Medications on File Prior to Encounter  Medication Sig Dispense Refill   Doxylamine-Pyridoxine (DICLEGIS) 10-10 MG TBEC Take 2 tablets by mouth at bedtime.  Take 2 tablets by mouth at bedtime. 4 tablet 0   Prenatal Vit-Fe Fumarate-FA (PRENATAL MULTIVITAMIN) TABS tablet Take 1 tablet by mouth daily at 12 noon.     Probiotic Product (PROBIOTIC PO) Take by mouth.     sertraline (ZOLOFT) 100 MG tablet Take 1 tablet (100 mg total) by mouth daily. 90 tablet 3   Magnesium Glycinate 120 MG CAPS Take 3 capsules by mouth daily in the afternoon. (Patient not taking: Reported on 04/10/2023)     Omega-3 Fatty Acids (FISH OIL OMEGA-3 PO) Take 640 mg by mouth daily in the afternoon. 3 capsules (Patient not taking: Reported on 04/10/2023)     Allergies  Allergen Reactions   Azithromycin Other (See Comments)    Severe stomach ache   Hydrocodone Other (See Comments)    GI upset   Morphine     Unknown, childhood reaction   Ciprofloxacin Hcl Hives    ROS:  Pertinent positives/negatives listed above.  I have reviewed patient's Past Medical Hx, Surgical Hx, Family Hx, Social Hx, medications and allergies.   Physical Exam  Patient Vitals for the past 24 hrs:  BP Temp Temp src Pulse Resp SpO2 Height Weight  04/21/23 2245 113/68 -- -- 82 -- 95 % -- --  04/21/23 2227 116/73 98.3 F (36.8 C) Oral 98 16 100 % 5\' 7"  (1.702 m) 67.6 kg   Constitutional: Well-developed, well-nourished female in no acute distress  Cardiovascular: normal rate Respiratory: normal effort GI: Abd soft, non-tender MS: Extremities nontender, no edema, normal ROM Neurologic: Alert and oriented x 4   FHT:  Baseline 135, moderate variability, accelerations present, no decelerations Contractions: None  LAB RESULTS No results found for this or any previous visit (from the past 24 hours).  O/Negative/-- (11/07 0854)  IMAGING No results found.  MAU Management/MDM: Orders Placed This Encounter  Procedures   Fetal non-stress test   Discharge patient    No orders of the defined types were placed in this encounter.    Available prenatal records reviewed.  Patient presents for  decreased fetal movement at 28 weeks and 3 days.  Has a reassuringly reactive NST here, and patient notes marked increase in movement after arrival.  Discussed reassuring fetal status at this time.  Patient has no other concerns at this time.  Will discharge home with instructions for kick counts.  ASSESSMENT 1. Decreased fetal movements in third trimester, fetus 1 of multiple gestation   2. NST (non-stress test) reactive     PLAN Discharge home with strict return precautions. Allergies as of 04/21/2023       Reactions   Azithromycin Other (See Comments)   Severe stomach ache   Hydrocodone Other (See Comments)   GI upset   Morphine    Unknown, childhood reaction   Ciprofloxacin Hcl Hives  Medication List     STOP taking these medications    FISH OIL OMEGA-3 PO       TAKE these medications    Doxylamine-Pyridoxine 10-10 MG Tbec Commonly known as: Diclegis Take 2 tablets by mouth at bedtime. Take 2 tablets by mouth at bedtime.   Magnesium Glycinate 120 MG Caps Take 3 capsules by mouth daily in the afternoon.   prenatal multivitamin Tabs tablet Take 1 tablet by mouth daily at 12 noon.   PROBIOTIC PO Take by mouth.   sertraline 100 MG tablet Commonly known as: ZOLOFT Take 1 tablet (100 mg total) by mouth daily.         Wylene Simmer, MD OB Fellow 04/21/2023  10:58 PM

## 2023-04-25 ENCOUNTER — Ambulatory Visit: Payer: Medicaid Other | Admitting: Physical Therapy

## 2023-05-02 ENCOUNTER — Ambulatory Visit (HOSPITAL_BASED_OUTPATIENT_CLINIC_OR_DEPARTMENT_OTHER): Payer: Medicaid Other | Admitting: Obstetrics & Gynecology

## 2023-05-02 ENCOUNTER — Encounter (HOSPITAL_BASED_OUTPATIENT_CLINIC_OR_DEPARTMENT_OTHER): Payer: Self-pay | Admitting: Obstetrics & Gynecology

## 2023-05-02 ENCOUNTER — Encounter: Payer: Medicaid Other | Admitting: Physical Therapy

## 2023-05-02 VITALS — BP 120/61 | HR 79 | Wt 147.6 lb

## 2023-05-02 DIAGNOSIS — O26843 Uterine size-date discrepancy, third trimester: Secondary | ICD-10-CM | POA: Diagnosis not present

## 2023-05-02 DIAGNOSIS — Z6741 Type O blood, Rh negative: Secondary | ICD-10-CM

## 2023-05-02 DIAGNOSIS — Z3A30 30 weeks gestation of pregnancy: Secondary | ICD-10-CM | POA: Diagnosis not present

## 2023-05-02 DIAGNOSIS — O219 Vomiting of pregnancy, unspecified: Secondary | ICD-10-CM

## 2023-05-02 DIAGNOSIS — Z3480 Encounter for supervision of other normal pregnancy, unspecified trimester: Secondary | ICD-10-CM

## 2023-05-02 MED ORDER — DOXYLAMINE-PYRIDOXINE 10-10 MG PO TBEC: 2.0000 | DELAYED_RELEASE_TABLET | Freq: Every evening | ORAL | 3 refills | Status: DC

## 2023-05-02 NOTE — Progress Notes (Signed)
   PRENATAL VISIT NOTE  Subjective:  Mary Cochran is a 28 y.o. G3P2002 at [redacted]w[redacted]d being seen today for ongoing prenatal care.  She is currently monitored for the following issues for this low-risk pregnancy and has Supervision of other normal pregnancy, antepartum; Gilbert syndrome; Gastroesophageal reflux during pregnancy, antepartum, first trimester; Type O blood, Rh negative; and Cyst of Bartholin's gland on their problem list.  Patient reports no complaints.  Contractions: Not present. Vag. Bleeding: None.  Movement: Present. Denies leaking of fluid.   The following portions of the patient's history were reviewed and updated as appropriate: allergies, current medications, past family history, past medical history, past social history, past surgical history and problem list.   Objective:   Vitals:   05/02/23 0918  BP: 120/61  Pulse: 79  Weight: 147 lb 9.6 oz (67 kg)    Fetal Status: Fetal Heart Rate (bpm): 153 Fundal Height: 33 cm Movement: Present     General:  Alert, oriented and cooperative. Patient is in no acute distress.  Skin: Skin is warm and dry. No rash noted.   Cardiovascular: Normal heart rate noted  Respiratory: Normal respiratory effort, no problems with respiration noted  Abdomen: Soft, gravid, appropriate for gestational age.  Pain/Pressure: Present     Pelvic: Cervical exam deferred        Extremities: Normal range of motion.  Edema: Trace  Mental Status: Normal mood and affect. Normal behavior. Normal judgment and thought content.   Assessment and Plan:  Pregnancy: G3P2002 at [redacted]w[redacted]d 1. Supervision of other normal pregnancy, antepartum (Primary) - on PNV  2. [redacted] weeks gestation of pregnancy  3. Type O blood, Rh negative - declined Rhogam.  Will try to add this to panorama.    4. Sullivan Lone syndrome  5. Nausea/vomiting in pregnancy - Doxylamine-Pyridoxine (DICLEGIS) 10-10 MG TBEC; Take 2 tablets by mouth at bedtime. Take 2 tablets by mouth at bedtime.   Dispense: 100 tablet; Refill: 3  6.  Size > dates - growth scan will be scheduled  Preterm labor symptoms and general obstetric precautions including but not limited to vaginal bleeding, contractions, leaking of fluid and fetal movement were reviewed in detail with the patient. Please refer to After Visit Summary for other counseling recommendations.   Return in about 2 weeks (around 05/16/2023).  Future Appointments  Date Time Provider Department Center  05/16/2023  9:15 AM Lo, Toma Aran, CNM [facility]  05/30/2023  9:15 AM Lo, Toma Aran, CNM [facility]  06/13/2023  9:15 AM Jerene Bears, MD [facility]  06/20/2023  9:15 AM Marton Redwood, Toma Aran, CNM [facility]  06/27/2023  8:55 AM Lo, Toma Aran, CNM [facility]  07/04/2023  9:15 AM Marton Redwood, Toma Aran, CNM [facility]  07/11/2023  9:15 AM Lo, Toma Aran, CNM [facility]    Jerene Bears, MD   . .

## 2023-05-04 LAB — PANORAMA PRENATAL TEST FULL PANEL:PANORAMA TEST PLUS 5 ADDITIONAL MICRODELETIONS: FETAL FRACTION: 7.5

## 2023-05-06 ENCOUNTER — Ambulatory Visit: Attending: Maternal & Fetal Medicine | Admitting: Obstetrics

## 2023-05-06 ENCOUNTER — Ambulatory Visit (HOSPITAL_BASED_OUTPATIENT_CLINIC_OR_DEPARTMENT_OTHER)

## 2023-05-06 DIAGNOSIS — O2693 Pregnancy related conditions, unspecified, third trimester: Secondary | ICD-10-CM | POA: Diagnosis not present

## 2023-05-06 DIAGNOSIS — O360130 Maternal care for anti-D [Rh] antibodies, third trimester, not applicable or unspecified: Secondary | ICD-10-CM

## 2023-05-06 DIAGNOSIS — O99282 Endocrine, nutritional and metabolic diseases complicating pregnancy, second trimester: Secondary | ICD-10-CM | POA: Diagnosis not present

## 2023-05-06 DIAGNOSIS — Z3A30 30 weeks gestation of pregnancy: Secondary | ICD-10-CM

## 2023-05-06 DIAGNOSIS — O3660X0 Maternal care for excessive fetal growth, unspecified trimester, not applicable or unspecified: Secondary | ICD-10-CM | POA: Diagnosis not present

## 2023-05-06 DIAGNOSIS — Z362 Encounter for other antenatal screening follow-up: Secondary | ICD-10-CM | POA: Diagnosis not present

## 2023-05-06 DIAGNOSIS — O26843 Uterine size-date discrepancy, third trimester: Secondary | ICD-10-CM | POA: Diagnosis not present

## 2023-05-06 NOTE — Progress Notes (Signed)
 MFM Consult Note  Mary Cochran is currently at 30 weeks and 4 days.  She was seen today as her fundal heights were measuring larger than her gestational age.    She denies any problems in her current pregnancy and has screened negative for gestational diabetes.  She also reports a history of Gilbert syndrome.   The overall EFW of 3 pounds 11 ounces obtained today measures at the 50th percentile for her gestational age.  There was normal amniotic fluid noted with a total AFI of 15.98 cm.  The patient was reassured that as the fetal growth is within normal limits, she may continue routine prenatal care.  No further exams were scheduled in our office.    The patient stated that all of her questions were answered today.  A total of 20 minutes was spent counseling and coordinating the care for this patient.  Greater than 50% of the time was spent in direct face-to-face contact.

## 2023-05-08 ENCOUNTER — Encounter (HOSPITAL_BASED_OUTPATIENT_CLINIC_OR_DEPARTMENT_OTHER): Payer: Self-pay | Admitting: Obstetrics & Gynecology

## 2023-05-16 ENCOUNTER — Ambulatory Visit (HOSPITAL_BASED_OUTPATIENT_CLINIC_OR_DEPARTMENT_OTHER): Payer: Medicaid Other | Admitting: Certified Nurse Midwife

## 2023-05-16 VITALS — BP 111/78 | HR 105 | Wt 151.4 lb

## 2023-05-16 DIAGNOSIS — Z3A32 32 weeks gestation of pregnancy: Secondary | ICD-10-CM

## 2023-05-16 DIAGNOSIS — Z3483 Encounter for supervision of other normal pregnancy, third trimester: Secondary | ICD-10-CM | POA: Diagnosis not present

## 2023-05-16 DIAGNOSIS — Z6741 Type O blood, Rh negative: Secondary | ICD-10-CM

## 2023-05-16 DIAGNOSIS — Z3480 Encounter for supervision of other normal pregnancy, unspecified trimester: Secondary | ICD-10-CM

## 2023-05-16 NOTE — Progress Notes (Signed)
   PRENATAL VISIT NOTE  Subjective:  Mary Cochran is a 28 y.o. G3P2002 at [redacted]w[redacted]d being seen today for ongoing prenatal care.  She is currently monitored for the following issues for this low-risk pregnancy and has Supervision of other normal pregnancy, antepartum; Gilbert syndrome; Type O blood, Rh negative; and Cyst of Bartholin's gland on their problem list.  Patient reports no bleeding, no contractions, no cramping, and no leaking.  Contractions: Irregular. Vag. Bleeding: None.  Movement: Present. Denies leaking of fluid.   The following portions of the patient's history were reviewed and updated as appropriate: allergies, current medications, past family history, past medical history, past social history, past surgical history and problem list.   Objective:   Vitals:   05/16/23 0925  BP: 111/78  Pulse: (!) 105  Weight: 151 lb 6.4 oz (68.7 kg)    Fetal Status:     Movement: Present     General:  Alert, oriented and cooperative. Patient is in no acute distress.  Skin: Skin is warm and dry. No rash noted.   Cardiovascular: Normal heart rate noted  Respiratory: Normal respiratory effort, no problems with respiration noted  Abdomen: Soft, gravid, appropriate for gestational age.  Pain/Pressure: Present     Pelvic: Cervical exam deferred        Extremities: Normal range of motion.  Edema: Trace (hands,feet and face)  Mental Status: Normal mood and affect. Normal behavior. Normal judgment and thought content.   Assessment and Plan:  Pregnancy: G3P2002 at [redacted]w[redacted]d 1. Supervision of other normal pregnancy, antepartum (Primary) - [place] (05/06/23): Vtx, posterior placenta, AFI wnl, EFW 3lb 11oz (50%), HC 70%, AC 64%, FL 15%. Normal interval growth.  - Taking PNV  2. Type O blood, Rh negative - Rhogam was not indicated (Panorama)   3. [redacted] weeks gestation of pregnancy  Preterm labor symptoms and general obstetric precautions including but not limited to vaginal bleeding, contractions,  leaking of fluid and fetal movement were reviewed in detail with the patient. Please refer to After Visit Summary for other counseling recommendations.   No follow-ups on file.  Future Appointments  Date Time Provider Department Center  05/30/2023  9:15 AM Nalin Mazzocco, Toma Aran, CNM [facility]  06/04/2023  8:55 AM Leftwich-Kirby, Wilmer Floor, CNM [facility] None  06/13/2023  9:15 AM Jerene Bears, MD [facility]  06/20/2023  9:15 AM Marton Redwood, Toma Aran, CNM [facility]  06/27/2023  8:55 AM Arther Heisler, Toma Aran, CNM [facility]  07/04/2023  9:15 AM Marton Redwood, Toma Aran, CNM [facility]  07/11/2023  9:15 AM Griffen Frayne, Toma Aran, CNM [facility]    Letta Kocher, CNM

## 2023-05-23 ENCOUNTER — Ambulatory Visit

## 2023-05-30 ENCOUNTER — Ambulatory Visit (HOSPITAL_BASED_OUTPATIENT_CLINIC_OR_DEPARTMENT_OTHER): Payer: Medicaid Other | Admitting: Certified Nurse Midwife

## 2023-05-30 DIAGNOSIS — Z3483 Encounter for supervision of other normal pregnancy, third trimester: Secondary | ICD-10-CM

## 2023-05-30 DIAGNOSIS — Z3A34 34 weeks gestation of pregnancy: Secondary | ICD-10-CM | POA: Diagnosis not present

## 2023-05-30 DIAGNOSIS — Z6741 Type O blood, Rh negative: Secondary | ICD-10-CM

## 2023-05-30 DIAGNOSIS — Z3480 Encounter for supervision of other normal pregnancy, unspecified trimester: Secondary | ICD-10-CM

## 2023-05-30 NOTE — Progress Notes (Signed)
    PRENATAL VISIT NOTE  Subjective:  CORETTA LEISEY is a 28 y.o. G3P2002 at [redacted]w[redacted]d being seen today for ongoing prenatal care.  She is currently monitored for the following issues for this low-risk pregnancy and has Supervision of other normal pregnancy, antepartum; Gilbert syndrome; Type O blood, Rh negative; and Cyst of Bartholin's gland on their problem list.  Patient reports  some mild edema hands/feet . Reports active fetal movement. No vaginal spotting or bleeding. Denies leaking of fluid.   The following portions of the patient's history were reviewed and updated as appropriate: allergies, current medications, past family history, past medical history, past social history, past surgical history and problem list.   Objective:   Vitals:   05/30/23 0929  BP: 104/71  Pulse: 83  Weight: 153 lb 12.8 oz (69.8 kg)    Fetal Status: Fetal Heart Rate (bpm): 139 Fundal Height: 35 cm Movement: Present  Presentation: Vertex  General:  Alert, oriented and cooperative. Patient is in no acute distress.  Skin: Skin is warm and dry. No rash noted.   Cardiovascular: Normal heart rate noted  Respiratory: Normal respiratory effort, no problems with respiration noted  Abdomen: Soft, gravid, appropriate for gestational age.  Pain/Pressure: Present     Pelvic: Cervical exam performed in the presence of a chaperone Dilation: Fingertip Effacement (%): 70 Station: -3  Extremities: Normal range of motion.  Edema: Trace ("Everywhere")  Mental Status: Normal mood and affect. Normal behavior. Normal judgment and thought content.   Assessment and Plan:  Pregnancy: G3P2002 at [redacted]w[redacted]d 1. Gilbert syndrome (Primary)   2. Supervision of other normal pregnancy, antepartum - [place]  (05/06/23): Vtx, posterior placenta, AFI wnl, EFW 3lb 11oz (50%), HC 70%, AC 64%, FL 15%. Normal interval growth.  - Taking PNV  3. Type O blood, Rh negative - Rhogam not indicated (Panorama)   Preterm labor symptoms and general  obstetric precautions including but not limited to vaginal bleeding, contractions, leaking of fluid and fetal movement were reviewed in detail with the patient. Please refer to After Visit Summary for other counseling recommendations.   No follow-ups on file.  Future Appointments  Date Time Provider Department Center  06/04/2023  8:55 AM Leftwich-Kirby, Darren Em, CNM [facility] None  06/13/2023  9:15 AM Lillian Rein, MD [facility]  06/20/2023  9:15 AM Hilda Lovings, Juvenal Opoka, CNM [facility]  06/27/2023  8:55 AM Walker Sitar, Juvenal Opoka, CNM [facility]  07/04/2023  9:15 AM Hilda Lovings, Juvenal Opoka, CNM [facility]  07/11/2023  9:15 AM Cyril Railey, Juvenal Opoka, CNM [facility]    Yolanda Hence, CNM

## 2023-06-04 ENCOUNTER — Encounter: Payer: Self-pay | Admitting: Advanced Practice Midwife

## 2023-06-04 ENCOUNTER — Telehealth: Admitting: Advanced Practice Midwife

## 2023-06-04 DIAGNOSIS — R102 Pelvic and perineal pain: Secondary | ICD-10-CM | POA: Diagnosis not present

## 2023-06-04 DIAGNOSIS — Z3A34 34 weeks gestation of pregnancy: Secondary | ICD-10-CM | POA: Diagnosis not present

## 2023-06-04 DIAGNOSIS — Z3480 Encounter for supervision of other normal pregnancy, unspecified trimester: Secondary | ICD-10-CM

## 2023-06-04 DIAGNOSIS — Z6741 Type O blood, Rh negative: Secondary | ICD-10-CM | POA: Diagnosis not present

## 2023-06-04 DIAGNOSIS — O26893 Other specified pregnancy related conditions, third trimester: Secondary | ICD-10-CM | POA: Diagnosis not present

## 2023-06-04 NOTE — Progress Notes (Signed)
 OBSTETRICS PRENATAL VIRTUAL VISIT ENCOUNTER NOTE  [facility]   Patient location: Home  I connected with Mary Cochran on 06/04/23 at  8:55 AM EDT by MyChart Video Encounter and verified that I am speaking with the correct person using two identifiers. I discussed the limitations, risks, security and privacy concerns of performing an evaluation and management service virtually and the availability of in person appointments. I also discussed with the patient that there may be a patient responsible charge related to this service. The patient expressed understanding and agreed to proceed. Subjective:  Mary Cochran is a 28 y.o. G3P2002 at [redacted]w[redacted]d being seen today for ongoing prenatal care.  She is currently monitored for the following issues for this low-risk pregnancy and has Supervision of other normal pregnancy, antepartum; Gilbert syndrome; Type O blood, Rh negative; and Cyst of Bartholin's gland on their problem list.  Patient reports  sharp shooting vaginal pain/pelvic pain that is irregular but painful .  Contractions: Irritability. Vag. Bleeding: None.  Movement: Present. Denies any leaking of fluid.   The following portions of the patient's history were reviewed and updated as appropriate: allergies, current medications, past family history, past medical history, past social history, past surgical history and problem list.   Objective:  There were no vitals filed for this visit.  Fetal Status:     Movement: Present     General:  Alert, oriented and cooperative. Patient is in no acute distress.  Respiratory: Normal respiratory effort, no problems with respiration noted  Mental Status: Normal mood and affect. Normal behavior. Normal judgment and thought content.  Rest of physical exam deferred due to type of encounter  Imaging:   Assessment and Plan:  Pregnancy: G3P2002 at [redacted]w[redacted]d 1. Supervision of other normal pregnancy,  antepartum (Primary) --Anticipatory guidance about next visits/weeks of pregnancy given.  - Pt interested in waterbirth and has attended the class.  --No barriers to waterbirth at this time - Reviewed conditions in labor that will risk her out of water immersion including thick meconium or blood stained amniotic fluid, non-reassuring fetal status on monitor, excessive bleeding, hypertension, dizziness, use of IV meds, damaged equipment or staffing that does not allow for water immersion, etc.  - The attending midwife/WB provider must be on the unit for water immersion to begin; pt understands this may delay the start of water immersion. - Reminded pt that signing consent in labor at the hospital also acknowledges they will exit the tub if the attending midwife requests. - Discussed other labor support options if waterbirth becomes unavailable, including position change, freedom of movement, use of birthing ball, and/or use of hydrotherapy in the shower (dependent upon medical condition/provider discretion).    2. Type O blood, Rh negative --Rhogam not given. Pt deferred, wants to get blood type for her husband. She does not have the answer to that yet. Message sent to [facility] to follow up and give rhogam if needed. Pt amenable to injection if needed. --Pt unsure if she has gotten PP dose with 2 other children   3. [redacted] weeks gestation of pregnancy   4. Pelvic pain affecting pregnancy in third trimester, antepartum --Rest/ice/heat/warm bath/increase PO fluids/Tylenol /pregnancy support belt  --Pt to get support belt, used one in previous pregnancy --In pelvic PT which is helping, has tried taping with PT  Preterm labor symptoms and general obstetric precautions including but not limited to vaginal bleeding, contractions, leaking of fluid and fetal movement were reviewed in  detail with the patient. I discussed the assessment and treatment plan with the patient. The patient was provided an opportunity to  ask questions and all were answered. The patient agreed with the plan and demonstrated an understanding of the instructions. The patient was advised to call back or seek an in-person office evaluation/go to [facility]U at [facility] for any urgent or concerning symptoms. Please refer to After Visit Summary for other counseling recommendations.   I provided 10 minutes of face-to-face time during this encounter.  Return in about 2 weeks (around 06/18/2023) for [facility] appt as scheduled.  Future Appointments  Date Time Provider Department Center  06/13/2023  9:15 AM Lillian Rein, MD [facility]  06/20/2023  9:15 AM Hilda Lovings, Juvenal Opoka, CNM [facility]  06/27/2023  8:55 AM Hilda Lovings, Juvenal Opoka, CNM [facility]  07/04/2023  9:15 AM Hilda Lovings, Juvenal Opoka, CNM [facility]  07/11/2023  9:15 AM Lo, Juvenal Opoka, CNM [facility]    Mary Cochran, CNM [facility]

## 2023-06-06 ENCOUNTER — Encounter: Payer: Self-pay | Admitting: Physician Assistant

## 2023-06-06 ENCOUNTER — Ambulatory Visit (INDEPENDENT_AMBULATORY_CARE_PROVIDER_SITE_OTHER): Admitting: Physician Assistant

## 2023-06-06 VITALS — BP 120/64 | HR 87 | Temp 98.1°F | Ht 67.0 in | Wt 155.5 lb

## 2023-06-06 DIAGNOSIS — H9202 Otalgia, left ear: Secondary | ICD-10-CM | POA: Diagnosis not present

## 2023-06-06 MED ORDER — FLUTICASONE PROPIONATE 50 MCG/ACT NA SUSP: 2.0000 | Freq: Every day | NASAL | 6 refills | Status: DC

## 2023-06-06 NOTE — Progress Notes (Signed)
 Mary Cochran is a 28 y.o. female here for a new problem.  History of Present Illness:   Chief Complaint  Patient presents with   Otalgia    Pt c/o left ear pain off and on, tender at times and achy.     Otalgia  Patient complains of intermittent left ear pain that has persisted for the past month  Associated symptoms include achiness  Denies taking any medications other than messaging and guasha to relief pressure.  Denies any severe allergies, pregnancy nasal congestion, or frequent use of air pods.   Past Medical History:  Diagnosis Date   Adenomatous polyp of colon 10/04/2015   ALLERGIC RHINITIS 12/12/2006   Allergy    SEASONAL   Anxiety    Attention and concentration deficit 06/08/2021   Depression 10/03/2011   GILBERT'S SYNDROME 03/07/2010   Headache    Migraines   IBS (irritable bowel syndrome) 10/04/2015   INSOMNIA-SLEEP DISORDER-UNSPEC 04/18/2007   Nonallopathic lesion of lumbosacral region 10/26/2015   Nonallopathic lesion of sacral region 10/26/2015   Nonallopathic lesion of thoracic region 10/26/2015   SYNCOPE 10/07/2008   Qualifier: Diagnosis of   By: Autry Legions MD, Alveda Aures        Tubular adenoma of colon 10/2014   Vertigo 04/06/2012     Social History   Tobacco Use   Smoking status: Never   Smokeless tobacco: Never  Vaping Use   Vaping status: Never Used  Substance Use Topics   Alcohol use: Not Currently    Comment: OCC.   Drug use: No    Past Surgical History:  Procedure Laterality Date   COLONOSCOPY  2016   MOUTH SURGERY     TONSILLECTOMY      Family History  Problem Relation Age of Onset   Varicose Veins Mother    Colon polyps Mother    Heart disease Father    Kidney cancer Maternal Aunt    Breast cancer Maternal Grandmother    Heart disease Maternal Grandmother    Miscarriages / Stillbirths Maternal Grandmother    Heart disease Maternal Grandfather    Varicose Veins Maternal Grandfather    Pancreatic cancer Other    Colon cancer  Neg Hx    Esophageal cancer Neg Hx    Rectal cancer Neg Hx    Stomach cancer Neg Hx     Allergies  Allergen Reactions   Azithromycin  Other (See Comments)    Severe stomach ache   Hydrocodone  Other (See Comments)    GI upset   Morphine     Unknown, childhood reaction   Ciprofloxacin  Hcl Hives    Current Medications:   Current Outpatient Medications:    Doxylamine -Pyridoxine  (DICLEGIS ) 10-10 MG TBEC, Take 2 tablets by mouth at bedtime. Take 2 tablets by mouth at bedtime., Disp: 100 tablet, Rfl: 3   fluticasone  (FLONASE ) 50 MCG/ACT nasal spray, Place 2 sprays into both nostrils daily., Disp: 16 g, Rfl: 6   Magnesium Glycinate 120 MG CAPS, Take 3 capsules by mouth daily in the afternoon., Disp: , Rfl:    Omega-3 Fatty Acids (FISH OIL PO), Take by mouth. 375, Disp: , Rfl:    Prenatal Vit-Fe Fumarate-FA (PRENATAL MULTIVITAMIN) TABS tablet, Take 1 tablet by mouth daily at 12 noon., Disp: , Rfl:    Probiotic Product (PROBIOTIC PO), Take by mouth., Disp: , Rfl:    sertraline  (ZOLOFT ) 100 MG tablet, Take 1 tablet (100 mg total) by mouth daily., Disp: 90 tablet, Rfl: 3   Review of Systems:  Review of Systems  HENT:  Positive for ear pain.   Negative unless otherwise specified per HPI.   Vitals:   Vitals:   06/06/23 1048  BP: 120/64  Pulse: 87  Temp: 98.1 F (36.7 C)  TempSrc: Temporal  SpO2: 97%  Weight: 155 lb 8 oz (70.5 kg)  Height: 5\' 7"  (1.702 m)     Body mass index is 24.35 kg/m.  Physical Exam:   Physical Exam Vitals and nursing note reviewed.  Constitutional:      General: She is not in acute distress.    Appearance: She is well-developed. She is not ill-appearing or toxic-appearing.  HENT:     Head: Normocephalic and atraumatic.     Right Ear: Ear canal and external ear normal. A middle ear effusion is present. Tympanic membrane is not erythematous, retracted or bulging.     Left Ear: Ear canal and external ear normal. A middle ear effusion is present.  Tympanic membrane is not erythematous, retracted or bulging.     Nose: Nose normal.     Right Sinus: No maxillary sinus tenderness or frontal sinus tenderness.     Left Sinus: No maxillary sinus tenderness or frontal sinus tenderness.     Mouth/Throat:     Pharynx: Uvula midline. No posterior oropharyngeal erythema.  Eyes:     General: Lids are normal.     Conjunctiva/sclera: Conjunctivae normal.  Neck:     Trachea: Trachea normal.  Cardiovascular:     Rate and Rhythm: Normal rate and regular rhythm.     Heart sounds: Normal heart sounds, S1 normal and S2 normal.  Pulmonary:     Effort: Pulmonary effort is normal.     Breath sounds: Normal breath sounds. No decreased breath sounds, wheezing, rhonchi or rales.  Lymphadenopathy:     Cervical: No cervical adenopathy.     Right cervical: No superficial, deep or posterior cervical adenopathy.    Left cervical: No superficial, deep or posterior cervical adenopathy.  Skin:    General: Skin is warm and dry.  Neurological:     Mental Status: She is alert.  Psychiatric:        Speech: Speech normal.        Behavior: Behavior normal. Behavior is cooperative.     Assessment and Plan:   Left ear pain No indication for antibiotics on my exam Suspect eustachian tube dysfunction Recommend Flonase  twice a day Regularly limit any oral medications since she is in her third trimester and she wants to avoid this If her symptoms worsen or do not improve after pregnancy, consider ENT referral  Alexander Iba, PA-C   I,Safa M Kadhim,acting as a scribe for Alexander Iba, PA.,have documented all relevant documentation on the behalf of Alexander Iba, PA,as directed by  Alexander Iba, PA while in the presence of Alexander Iba, [place].   I, Alexander Iba, [place], have reviewed all documentation for this visit. The documentation on 06/06/23 for the exam, diagnosis, procedures, and orders are all accurate and complete.

## 2023-06-11 ENCOUNTER — Encounter (HOSPITAL_BASED_OUTPATIENT_CLINIC_OR_DEPARTMENT_OTHER): Payer: Self-pay | Admitting: Obstetrics & Gynecology

## 2023-06-13 ENCOUNTER — Other Ambulatory Visit (HOSPITAL_COMMUNITY)
Admission: RE | Admit: 2023-06-13 | Discharge: 2023-06-13 | Disposition: A | Discharge status: Home / Self Care | Source: Ambulatory Visit | Attending: Obstetrics & Gynecology | Admitting: Obstetrics & Gynecology

## 2023-06-13 ENCOUNTER — Other Ambulatory Visit: Payer: Self-pay

## 2023-06-13 ENCOUNTER — Encounter (HOSPITAL_BASED_OUTPATIENT_CLINIC_OR_DEPARTMENT_OTHER): Payer: Self-pay | Admitting: Obstetrics & Gynecology

## 2023-06-13 ENCOUNTER — Telehealth: Payer: Self-pay

## 2023-06-13 ENCOUNTER — Ambulatory Visit (HOSPITAL_BASED_OUTPATIENT_CLINIC_OR_DEPARTMENT_OTHER): Payer: Medicaid Other | Admitting: Obstetrics & Gynecology

## 2023-06-13 ENCOUNTER — Ambulatory Visit: Attending: Cardiology

## 2023-06-13 VITALS — BP 107/73 | HR 88 | Wt 158.6 lb

## 2023-06-13 DIAGNOSIS — R002 Palpitations: Secondary | ICD-10-CM

## 2023-06-13 DIAGNOSIS — Z3483 Encounter for supervision of other normal pregnancy, third trimester: Secondary | ICD-10-CM | POA: Insufficient documentation

## 2023-06-13 DIAGNOSIS — Z6741 Type O blood, Rh negative: Secondary | ICD-10-CM | POA: Diagnosis not present

## 2023-06-13 DIAGNOSIS — Z3A36 36 weeks gestation of pregnancy: Secondary | ICD-10-CM | POA: Diagnosis not present

## 2023-06-13 DIAGNOSIS — Z3480 Encounter for supervision of other normal pregnancy, unspecified trimester: Secondary | ICD-10-CM | POA: Diagnosis not present

## 2023-06-13 NOTE — Progress Notes (Signed)
   PRENATAL VISIT NOTE  Subjective:  Mary Cochran is a 28 y.o. G3P2002 at [redacted]w[redacted]d being seen today for ongoing prenatal care.  She is currently monitored for the following issues for this low-risk pregnancy and has Supervision of other normal pregnancy, antepartum; Gilbert syndrome; Type O blood, Rh negative; and Cyst of Bartholin's gland on their problem list.  Patient reports some palpitations and elevated heart rate.  Feeling more in the evening.  Never had this with prior pregnancies.  Wondering if this is something that should evaluated as is new and different.  Contractions: Not present. Vag. Bleeding: None.  Movement: Present. Denies leaking of fluid.   The following portions of the patient's history were reviewed and updated as appropriate: allergies, current medications, past family history, past medical history, past social history, past surgical history and problem list.  Objective:   Vitals:   06/13/23 0918  BP: 107/73  Pulse: 88  Weight: 158 lb 9.6 oz (71.9 kg)     Fetal Status: Fetal Heart Rate (bpm): 146 Fundal Height: 37 cm Movement: Present  Presentation: Vertex   General: Alert, oriented and cooperative. Patient is in no acute distress.  Skin: Skin is warm and dry. No rash noted.   Cardiovascular: Normal heart rate noted  Respiratory: Normal respiratory effort, no problems with respiration noted  Abdomen: Soft, gravid, appropriate for gestational age.  Pain/Pressure: Present     Pelvic: Cervical exam performed in the presence of a chaperone Dilation: 1.5 Effacement (%): 40 Station: -3 (ballotable)  Extremities: Normal range of motion.  Edema: Trace  Mental Status: Normal mood and affect. Normal behavior. Normal judgment and thought content.   Assessment and Plan:  Pregnancy: G3P2002 at [redacted]w[redacted]d 1. [redacted] weeks gestation of pregnancy (Primary) - on PNV - Culture, beta strep (group b only) - Cervicovaginal ancillary only( [facility])  2. Supervision of other normal  pregnancy, antepartum - Culture, beta strep (group b only) - Cervicovaginal ancillary only( [facility])  3. Type O blood, Rh negative - Baby's RH is negative with panorama testing.  Declined Rhogam  4. Gilbert syndrome  5.  Palpitations - reached out to Dr. Emmette Harms regarding pt and Zio cardiac monitoring will be ordered by her and if they have any questions about placement of monitor, advised to call.  Preterm labor symptoms and general obstetric precautions including but not limited to vaginal bleeding, contractions, leaking of fluid and fetal movement were reviewed in detail with the patient. Please refer to After Visit Summary for other counseling recommendations.   Return in about 1 week (around 06/20/2023).  Future Appointments  Date Time Provider Department Center  06/19/2023  9:15 AM Lo, Juvenal Opoka, CNM [facility]  06/27/2023  8:55 AM Lo, Juvenal Opoka, CNM [facility]  07/04/2023  9:15 AM Hilda Lovings, Juvenal Opoka, CNM [facility]  07/11/2023  9:15 AM Lo, Juvenal Opoka, CNM [facility]    Lillian Rein, MD

## 2023-06-13 NOTE — Progress Notes (Signed)
 Order for Zio placed per Dr. Mallory Shirk request.

## 2023-06-13 NOTE — Progress Notes (Unsigned)
 Enrolled patient for a 14 day Zio XT  monitor to be mailed to patients home

## 2023-06-13 NOTE — Telephone Encounter (Signed)
 Called pt, went over Zio monitor instructions. Questions asked and answered, no further questions at this time.

## 2023-06-14 ENCOUNTER — Encounter (HOSPITAL_BASED_OUTPATIENT_CLINIC_OR_DEPARTMENT_OTHER): Payer: Self-pay | Admitting: Obstetrics & Gynecology

## 2023-06-14 LAB — CERVICOVAGINAL ANCILLARY ONLY
Chlamydia: NEGATIVE
Comment: NEGATIVE
Comment: NORMAL
Neisseria Gonorrhea: NEGATIVE

## 2023-06-16 LAB — CULTURE, BETA STREP (GROUP B ONLY): Strep Gp B Culture: POSITIVE — AB

## 2023-06-19 ENCOUNTER — Ambulatory Visit (HOSPITAL_BASED_OUTPATIENT_CLINIC_OR_DEPARTMENT_OTHER): Admitting: Certified Nurse Midwife

## 2023-06-19 VITALS — BP 106/63 | HR 87 | Wt 159.6 lb

## 2023-06-19 DIAGNOSIS — F419 Anxiety disorder, unspecified: Secondary | ICD-10-CM | POA: Diagnosis not present

## 2023-06-19 DIAGNOSIS — Z3480 Encounter for supervision of other normal pregnancy, unspecified trimester: Secondary | ICD-10-CM

## 2023-06-19 DIAGNOSIS — Z3483 Encounter for supervision of other normal pregnancy, third trimester: Secondary | ICD-10-CM | POA: Diagnosis not present

## 2023-06-19 DIAGNOSIS — Z3A36 36 weeks gestation of pregnancy: Secondary | ICD-10-CM

## 2023-06-19 NOTE — Progress Notes (Signed)
    PRENATAL VISIT NOTE  Subjective:  Mary Cochran is a 28 y.o. G3P2002 at [redacted]w[redacted]d being seen today for ongoing prenatal care.  She is currently monitored for the following issues for this  pregnancy and has Supervision of other normal pregnancy, antepartum; Gilbert syndrome; Type O blood, Rh negative; and Cyst of Bartholin's gland on their problem list.  Patient reports no bleeding and no leaking.  Contractions: Irregular. Vag. Bleeding: None.  Movement: Present. Denies leaking of fluid.   The following portions of the patient's history were reviewed and updated as appropriate: allergies, current medications, past family history, past medical history, past social history, past surgical history and problem list.   Objective:    Vitals:   06/19/23 0918  BP: 106/63  Pulse: 87  Weight: 159 lb 9.6 oz (72.4 kg)    Fetal Status:  Fetal Heart Rate (bpm): 147   Movement: Present    General: Alert, oriented and cooperative. Patient is in no acute distress.  Skin: Skin is warm and dry. No rash noted.   Cardiovascular: Normal heart rate noted  Respiratory: Normal respiratory effort, no problems with respiration noted  Abdomen: Soft, gravid, appropriate for gestational age.  Pain/Pressure: Present     Pelvic: Cervical exam performed in the presence of a chaperone Dilation: 1.5 Effacement (%): 40 Station: -3  Extremities: Normal range of motion.  Edema: Trace (hands,feet,face)  Mental Status: Normal mood and affect. Normal behavior. Normal judgment and thought content.   Assessment and Plan:  Pregnancy: G3P2002 at [redacted]w[redacted]d  1. Gilbert syndrome (Primary)    2. Supervision of other normal pregnancy, antepartum - US  (05/06/23): Vtx, posterior placenta, AFI wnl, EFW 3lb 11oz (50%), HC 70%, AC 64%, FL 15%. Normal interval growth.  - Taking PNV - GBS+, needs intrapartum prophylaxis. Pt aware.    3. Type O blood, Rh negative - Rhogam not indicated (Panorama)  4. Hx Anxiety - Taking  Sertraline  as directed and doing well emotionally   Term labor symptoms and general obstetric precautions including but not limited to vaginal bleeding, contractions, leaking of fluid and fetal movement were reviewed in detail with the patient. Please refer to After Visit Summary for other counseling recommendations.   No follow-ups on file.  Future Appointments  Date Time Provider Department Center  06/27/2023  8:55 AM Riddhi Grether, Juvenal Opoka, CNM [facility]  07/04/2023  9:15 AM Chaka Jefferys, Juvenal Opoka, CNM [facility]  07/11/2023  9:15 AM Deliliah Spranger, Juvenal Opoka, CNM [facility]    Yolanda Hence, CNM

## 2023-06-20 ENCOUNTER — Encounter (HOSPITAL_BASED_OUTPATIENT_CLINIC_OR_DEPARTMENT_OTHER): Payer: Medicaid Other | Admitting: Certified Nurse Midwife

## 2023-06-20 ENCOUNTER — Encounter (HOSPITAL_COMMUNITY): Payer: Self-pay | Admitting: Obstetrics and Gynecology

## 2023-06-20 ENCOUNTER — Inpatient Hospital Stay (HOSPITAL_COMMUNITY)
Admission: AD | Admit: 2023-06-20 | Discharge: 2023-06-20 | Disposition: A | Discharge status: Home / Self Care | Payer: Medicaid Other | Attending: Obstetrics and Gynecology | Admitting: Obstetrics and Gynecology

## 2023-06-20 ENCOUNTER — Encounter (HOSPITAL_BASED_OUTPATIENT_CLINIC_OR_DEPARTMENT_OTHER): Payer: Self-pay | Admitting: Obstetrics & Gynecology

## 2023-06-20 DIAGNOSIS — Z3689 Encounter for other specified antenatal screening: Secondary | ICD-10-CM | POA: Diagnosis not present

## 2023-06-20 DIAGNOSIS — Z3480 Encounter for supervision of other normal pregnancy, unspecified trimester: Secondary | ICD-10-CM

## 2023-06-20 DIAGNOSIS — O368130 Decreased fetal movements, third trimester, not applicable or unspecified: Secondary | ICD-10-CM

## 2023-06-20 DIAGNOSIS — Z3A37 37 weeks gestation of pregnancy: Secondary | ICD-10-CM

## 2023-06-20 NOTE — MAU Note (Signed)
 Mary Cochran is a 28 y.o. at [redacted]w[redacted]d here in MAU reporting decreased Fm today. Has felt FM but just not like usual today. Denies VB or LOF. Thinks lost mucous plug this week. Was almost 2cm yesterday. Occ ctxs and having pelvic pressure  LMP: n/a Onset of complaint: today Pain score: 0 Vitals:   06/20/23 2211  BP: 110/68  Pulse: 80  Resp: 17  Temp: 97.7 F (36.5 C)  SpO2: 98%     FHT: 142  Lab orders placed from triage: none

## 2023-06-20 NOTE — MAU Note (Signed)
 SVE done per pt request since she is having regular ctxs

## 2023-06-20 NOTE — Progress Notes (Signed)
 Written and verbal d/c instructions given and understanding voiced.

## 2023-06-20 NOTE — MAU Note (Signed)
 Pt given 'clicker' to mark FM. When EFM applied initially baby was moving and pt aware of FM

## 2023-06-20 NOTE — MAU Provider Note (Signed)
 Chief Complaint:  Decreased Fetal Movement   Event Date/Time   First Provider Initiated Contact with Patient 06/20/23 2235     HPI  HPI: Mary Cochran is a 28 y.o. G3P2002 at 40w0dwho presents to maternity admissions reporting decreased fetal movement today. Feels movement now. She denies LOF, vaginal bleeding, or fever/chills.  RN note:  Mary Cochran is a 28 y.o. at [redacted]w[redacted]d here in MAU reporting decreased Fm today. Has felt FM but just not like usual today. Denies VB or LOF. Thinks lost mucous plug this week. Was almost 2cm yesterday. Occ ctxs and having pelvic pressure    Past Medical History: Past Medical History:  Diagnosis Date   Adenomatous polyp of colon 10/04/2015   ALLERGIC RHINITIS 12/12/2006   Allergy    SEASONAL   Anxiety    Attention and concentration deficit 06/08/2021   Depression 10/03/2011   GILBERT'S SYNDROME 03/07/2010   Headache    Migraines   IBS (irritable bowel syndrome) 10/04/2015   INSOMNIA-SLEEP DISORDER-UNSPEC 04/18/2007   Nonallopathic lesion of lumbosacral region 10/26/2015   Nonallopathic lesion of sacral region 10/26/2015   Nonallopathic lesion of thoracic region 10/26/2015   SYNCOPE 10/07/2008   Qualifier: Diagnosis of   By: Autry Legions MD, Alveda Aures        Tubular adenoma of colon 10/2014   Vertigo 04/06/2012    Past obstetric history: OB History  Gravida Para Term Preterm AB Living  3 2 2   2   SAB IAB Ectopic Multiple Live Births     0 2    # Outcome Date GA Lbr Len/2nd Weight Sex Type Anes PTL Lv  3 Current           2 Term 08/05/18 [redacted]w[redacted]d  3033 g M Vag-Spont  N LIV  1 Term 07/07/17 [redacted]w[redacted]d 01:46 / 01:03 2810 g F Vag-Spont EPI  LIV    Past Surgical History: Past Surgical History:  Procedure Laterality Date   COLONOSCOPY  2016   MOUTH SURGERY     TONSILLECTOMY      Family History: Family History  Problem Relation Age of Onset   Varicose Veins Mother    Colon polyps Mother    Heart disease Father    Kidney cancer Maternal  Aunt    Breast cancer Maternal Grandmother    Heart disease Maternal Grandmother    Miscarriages / Stillbirths Maternal Grandmother    Heart disease Maternal Grandfather    Varicose Veins Maternal Grandfather    Pancreatic cancer Other    Colon cancer Neg Hx    Esophageal cancer Neg Hx    Rectal cancer Neg Hx    Stomach cancer Neg Hx     Social History: Social History   Tobacco Use   Smoking status: Never   Smokeless tobacco: Never  Vaping Use   Vaping status: Never Used  Substance Use Topics   Alcohol use: Not Currently    Comment: OCC.   Drug use: No    Allergies:  Allergies  Allergen Reactions   Azithromycin  Other (See Comments)    Severe stomach ache   Hydrocodone  Other (See Comments)    GI upset   Morphine     Unknown, childhood reaction   Ciprofloxacin  Hcl Hives    Meds:  Medications Prior to Admission  Medication Sig Dispense Refill Last Dose/Taking   Doxylamine -Pyridoxine  (DICLEGIS ) 10-10 MG TBEC Take 2 tablets by mouth at bedtime. Take 2 tablets by mouth at bedtime. 100 tablet 3 06/19/2023   Prenatal  Vit-Fe Fumarate-FA (PRENATAL MULTIVITAMIN) TABS tablet Take 1 tablet by mouth daily at 12 noon.   06/20/2023   sertraline  (ZOLOFT ) 100 MG tablet Take 1 tablet (100 mg total) by mouth daily. 90 tablet 3 06/19/2023   fluticasone  (FLONASE ) 50 MCG/ACT nasal spray Place 2 sprays into both nostrils daily. (Patient not taking: Reported on 06/19/2023) 16 g 6    Magnesium Glycinate 120 MG CAPS Take 3 capsules by mouth daily in the afternoon. (Patient not taking: Reported on 06/19/2023)      Omega-3 Fatty Acids (FISH OIL PO) Take by mouth. 375 (Patient not taking: Reported on 06/19/2023)      Probiotic Product (PROBIOTIC PO) Take by mouth. (Patient not taking: Reported on 06/19/2023)       I have reviewed patient's Past Medical Hx, Surgical Hx, Family Hx, Social Hx, medications and allergies.   ROS:  Review of Systems  Constitutional:  Negative for chills and fever.   Respiratory:  Negative for shortness of breath.   Genitourinary:  Negative for dysuria, pelvic pain and vaginal bleeding.   Other systems negative  Physical Exam  Patient Vitals for the past 24 hrs:  BP Temp Pulse Resp SpO2 Height Weight  06/20/23 2228 118/72 -- 96 -- -- -- --  06/20/23 2211 110/68 97.7 F (36.5 C) 80 17 98 % 5\' 7"  (1.702 m) 73 kg   Constitutional: Well-developed, well-nourished female in no acute distress.  Cardiovascular: normal rate and rhythm Respiratory: normal effort, clear to auscultation bilaterally GI: Abd soft, non-tender, gravid appropriate for gestational age.   No rebound or guarding. MS: Extremities nontender, no edema, normal ROM Neurologic: Alert and oriented x 4.  GU: Neg CVAT.   Dilation: 1.5 Effacement (%): 40 Cervical Position: Posterior Station: -3 Presentation: Vertex Exam by:: Marshia Skene RNC  FHT:  Baseline 135 , moderate variability, accelerations present, no decelerations Contractions:  Irregular     Labs: No results found for this or any previous visit (from the past 24 hours). O/Negative/-- (11/07 4098)  Imaging:  No results found.  MAU Course/MDM: I have reviewed the triage vital signs and the nursing notes.   Pertinent labs & imaging results that were available during my care of the patient were reviewed by me and considered in my medical decision making (see chart for details).      I have reviewed her medical records including past results, notes and treatments.  .  NST reviewed  It has been reactive throughout visit.     Treatments in MAU included EFM.    Assessment: Single IUP at [redacted]w[redacted]d Decreased fetal movement, resolved Reactive NST  Plan: Discharge home Labor precautions and fetal kick counts Follow up in Office for prenatal visits and recheck Encouraged to return if she develops worsening of symptoms, increase in pain, fever, or other concerning symptoms.   Pt stable at time of discharge.  Holmes Lusher  CNM, MSN Certified Nurse-Midwife 06/20/2023 10:35 PM

## 2023-06-27 ENCOUNTER — Ambulatory Visit (HOSPITAL_BASED_OUTPATIENT_CLINIC_OR_DEPARTMENT_OTHER): Payer: Medicaid Other | Admitting: Certified Nurse Midwife

## 2023-06-27 VITALS — BP 96/65 | HR 80 | Wt 159.6 lb

## 2023-06-27 DIAGNOSIS — Z3483 Encounter for supervision of other normal pregnancy, third trimester: Secondary | ICD-10-CM | POA: Diagnosis not present

## 2023-06-27 DIAGNOSIS — B951 Streptococcus, group B, as the cause of diseases classified elsewhere: Secondary | ICD-10-CM | POA: Diagnosis not present

## 2023-06-27 DIAGNOSIS — Z3A38 38 weeks gestation of pregnancy: Secondary | ICD-10-CM | POA: Diagnosis not present

## 2023-06-27 DIAGNOSIS — Z6741 Type O blood, Rh negative: Secondary | ICD-10-CM

## 2023-06-27 DIAGNOSIS — Z3480 Encounter for supervision of other normal pregnancy, unspecified trimester: Secondary | ICD-10-CM

## 2023-06-27 NOTE — Progress Notes (Signed)
    PRENATAL VISIT NOTE  Subjective:  Mary Cochran is a 28 y.o. G3P2002 at [redacted]w[redacted]d being seen today for ongoing prenatal care.  She is currently monitored for the following issues for this  pregnancy and has Supervision of other normal pregnancy, antepartum; Gilbert syndrome; Type O blood, Rh negative; and Cyst of Bartholin's gland on their problem list.  Patient reports no bleeding, no leaking, and occasional contractions.  Denies leaking of fluid.   The following portions of the patient's history were reviewed and updated as appropriate: allergies, current medications, past family history, past medical history, past social history, past surgical history and problem list.   Objective:    There were no vitals filed for this visit.  Fetal Status:           General: Alert, oriented and cooperative. Patient is in no acute distress.  Skin: Skin is warm and dry. No rash noted.   Cardiovascular: Normal heart rate noted  Respiratory: Normal respiratory effort, no problems with respiration noted  Abdomen: Soft, gravid, appropriate for gestational age.        Pelvic: Cervical exam performed in the presence of a chaperone        Extremities: Normal range of motion.     Mental Status: Normal mood and affect. Normal behavior. Normal judgment and thought content.   Assessment and Plan:  Pregnancy: G3P2002 at [redacted]w[redacted]d  1. Supervision of other normal pregnancy, antepartum (Primary)  - US  (05/06/23): Vtx, posterior placenta, AFI wnl, EFW 3lb 11oz (50%), HC 70%, AC 64%, FL 15%. Normal interval growth.  - Taking PNV - GBS+, needs intrapartum prophylaxis. Pt aware.  2. Type O blood, Rh negative  3. Positive GBS - Plan intrapartum prophylaxis    Term labor symptoms and general obstetric precautions including but not limited to vaginal bleeding, contractions, leaking of fluid and fetal movement were reviewed in detail with the patient. Please refer to After Visit Summary for other counseling  recommendations.   No follow-ups on file.  Future Appointments  Date Time Provider Department Center  06/27/2023  8:55 AM Athalee Esterline, Juvenal Opoka, CNM [facility]  07/04/2023  9:15 AM Pearlee Arvizu, Juvenal Opoka, CNM [facility]  07/11/2023  9:15 AM Tawanna Funk, Juvenal Opoka, CNM [facility]    Yolanda Hence, CNM

## 2023-07-04 ENCOUNTER — Encounter (HOSPITAL_BASED_OUTPATIENT_CLINIC_OR_DEPARTMENT_OTHER): Payer: Self-pay | Admitting: Obstetrics & Gynecology

## 2023-07-04 ENCOUNTER — Other Ambulatory Visit (HOSPITAL_BASED_OUTPATIENT_CLINIC_OR_DEPARTMENT_OTHER): Payer: Self-pay | Admitting: Obstetrics & Gynecology

## 2023-07-04 ENCOUNTER — Ambulatory Visit (HOSPITAL_BASED_OUTPATIENT_CLINIC_OR_DEPARTMENT_OTHER): Payer: Self-pay | Admitting: Certified Nurse Midwife

## 2023-07-04 VITALS — BP 103/80 | HR 96 | Wt 160.6 lb

## 2023-07-04 DIAGNOSIS — Z3480 Encounter for supervision of other normal pregnancy, unspecified trimester: Secondary | ICD-10-CM

## 2023-07-04 DIAGNOSIS — Z3A39 39 weeks gestation of pregnancy: Secondary | ICD-10-CM

## 2023-07-04 DIAGNOSIS — B951 Streptococcus, group B, as the cause of diseases classified elsewhere: Secondary | ICD-10-CM

## 2023-07-04 DIAGNOSIS — Z6741 Type O blood, Rh negative: Secondary | ICD-10-CM | POA: Diagnosis not present

## 2023-07-04 NOTE — Progress Notes (Signed)
   PRENATAL VISIT NOTE  Subjective:  Mary Cochran is a 28 y.o. G3P2002 at [redacted]w[redacted]d being seen today for ongoing prenatal care.  She is currently monitored for the following issues for this low-risk pregnancy and has Supervision of other normal pregnancy, antepartum; Gilbert syndrome; Type O blood, Rh negative; Cyst of Bartholin's gland; and Positive GBS test on their problem list.  Patient reports pressure and irregular contractions.  Contractions: Irregular. Vag. Bleeding: None.  Movement: Present. Denies leaking of fluid.   Discussed timing of delivery.  Comfortable with being added to the induction scheduled at 41 weeks.  Will change appt to Friday for next week to do NST if needed.    The following portions of the patient's history were reviewed and updated as appropriate: allergies, current medications, past family history, past medical history, past social history, past surgical history and problem list.   Objective:   Vitals:   07/04/23 0902  BP: 103/80  Pulse: 96  Weight: 160 lb 9.6 oz (72.8 kg)    Fetal Status: Fetal Heart Rate (bpm): 135 Fundal Height: 39 cm Movement: Present  Presentation: Vertex  General:  Alert, oriented and cooperative. Patient is in no acute distress.  Skin: Skin is warm and dry. No rash noted.   Cardiovascular: Normal heart rate noted  Respiratory: Normal respiratory effort, no problems with respiration noted  Abdomen: Soft, gravid, appropriate for gestational age.  Pain/Pressure: Present     Pelvic: Cervical exam performed in the presence of a chaperone Dilation: 3 Effacement (%): 50 Station: -3  Extremities: Normal range of motion.  Edema: Trace  Mental Status: Normal mood and affect. Normal behavior. Normal judgment and thought content.   Assessment and Plan:  Pregnancy: G3P2002 at [redacted]w[redacted]d 1. Supervision of other normal pregnancy, antepartum (Primary) - on PNV  2. Type O blood, Rh negative - baby is RH negative on panorama.  Pt declined  Rhogam.  3. Gilbert syndrome  4. Positive GBS test - will be treated in L&D  5. [redacted] weeks gestation of pregnancy - discussed induction at 39 weeks.  Will add to induction schedule.  Pt does really hope for water birth. - will change appt to next Friday and plan NST at that time as well.    Term labor symptoms and general obstetric precautions including but not limited to vaginal bleeding, contractions, leaking of fluid and fetal movement were reviewed in detail with the patient. Please refer to After Visit Summary for other counseling recommendations.   F/u 1 week  Future Appointments  Date Time Provider Department Center  07/11/2023  9:15 AM Lo, Juvenal Opoka, CNM [facility]    Lillian Rein, MD

## 2023-07-06 ENCOUNTER — Encounter (HOSPITAL_COMMUNITY): Payer: Self-pay | Admitting: Obstetrics & Gynecology

## 2023-07-06 ENCOUNTER — Other Ambulatory Visit: Payer: Self-pay

## 2023-07-06 ENCOUNTER — Inpatient Hospital Stay (HOSPITAL_COMMUNITY)
Admission: AD | Admit: 2023-07-06 | Discharge: 2023-07-08 | DRG: 807 | Disposition: A | Discharge status: Home / Self Care | Attending: Obstetrics and Gynecology | Admitting: Obstetrics and Gynecology

## 2023-07-06 ENCOUNTER — Encounter (HOSPITAL_BASED_OUTPATIENT_CLINIC_OR_DEPARTMENT_OTHER): Payer: Self-pay | Admitting: Obstetrics & Gynecology

## 2023-07-06 ENCOUNTER — Inpatient Hospital Stay (HOSPITAL_COMMUNITY)

## 2023-07-06 DIAGNOSIS — Z6791 Unspecified blood type, Rh negative: Secondary | ICD-10-CM | POA: Diagnosis not present

## 2023-07-06 DIAGNOSIS — Z881 Allergy status to other antibiotic agents status: Secondary | ICD-10-CM

## 2023-07-06 DIAGNOSIS — Z8249 Family history of ischemic heart disease and other diseases of the circulatory system: Secondary | ICD-10-CM

## 2023-07-06 DIAGNOSIS — O368130 Decreased fetal movements, third trimester, not applicable or unspecified: Secondary | ICD-10-CM | POA: Diagnosis not present

## 2023-07-06 DIAGNOSIS — O99824 Streptococcus B carrier state complicating childbirth: Secondary | ICD-10-CM | POA: Diagnosis present

## 2023-07-06 DIAGNOSIS — Z3A39 39 weeks gestation of pregnancy: Secondary | ICD-10-CM | POA: Diagnosis not present

## 2023-07-06 DIAGNOSIS — R0789 Other chest pain: Secondary | ICD-10-CM | POA: Diagnosis not present

## 2023-07-06 DIAGNOSIS — Z79899 Other long term (current) drug therapy: Secondary | ICD-10-CM

## 2023-07-06 DIAGNOSIS — O26893 Other specified pregnancy related conditions, third trimester: Secondary | ICD-10-CM | POA: Diagnosis present

## 2023-07-06 DIAGNOSIS — O99820 Streptococcus B carrier state complicating pregnancy: Secondary | ICD-10-CM | POA: Diagnosis not present

## 2023-07-06 LAB — COMPREHENSIVE METABOLIC PANEL WITH GFR
ALT: 13 U/L (ref 0–44)
AST: 23 U/L (ref 15–41)
Albumin: 2.6 g/dL — ABNORMAL LOW (ref 3.5–5.0)
Alkaline Phosphatase: 115 U/L (ref 38–126)
Anion gap: 9 (ref 5–15)
BUN: 8 mg/dL (ref 6–20)
CO2: 20 mmol/L — ABNORMAL LOW (ref 22–32)
Calcium: 9 mg/dL (ref 8.9–10.3)
Chloride: 104 mmol/L (ref 98–111)
Creatinine, Ser: 0.58 mg/dL (ref 0.44–1.00)
GFR, Estimated: >60 mL/min (ref >60)
Glucose, Bld: 84 mg/dL (ref 70–99)
Potassium: 3.9 mmol/L (ref 3.5–5.1)
Sodium: 133 mmol/L — ABNORMAL LOW (ref 135–145)
Total Bilirubin: 0.9 mg/dL (ref 0.0–1.2)
Total Protein: 6.1 g/dL — ABNORMAL LOW (ref 6.5–8.1)

## 2023-07-06 LAB — CBC WITH DIFFERENTIAL/PLATELET
Abs Immature Granulocytes: 0.15 10*3/uL — ABNORMAL HIGH (ref 0.00–0.07)
Basophils Absolute: 0 10*3/uL (ref 0.0–0.1)
Basophils Relative: 1 %
Eosinophils Absolute: 0.1 10*3/uL (ref 0.0–0.5)
Eosinophils Relative: 1 %
HCT: 35.8 % — ABNORMAL LOW (ref 36.0–46.0)
Hemoglobin: 12 g/dL (ref 12.0–15.0)
Immature Granulocytes: 2 %
Lymphocytes Relative: 15 %
Lymphs Abs: 1.2 10*3/uL (ref 0.7–4.0)
MCH: 31.3 pg (ref 26.0–34.0)
MCHC: 33.5 g/dL (ref 30.0–36.0)
MCV: 93.2 fL (ref 80.0–100.0)
Monocytes Absolute: 0.9 10*3/uL (ref 0.1–1.0)
Monocytes Relative: 11 %
Neutro Abs: 5.9 10*3/uL (ref 1.7–7.7)
Neutrophils Relative %: 70 %
Platelets: 204 10*3/uL (ref 150–400)
RBC: 3.84 MIL/uL — ABNORMAL LOW (ref 3.87–5.11)
RDW: 14.3 % (ref 11.5–15.5)
WBC: 8.3 10*3/uL (ref 4.0–10.5)
nRBC: 0 % (ref 0.0–0.2)

## 2023-07-06 LAB — TYPE AND SCREEN
ABO/RH(D): O NEG
Antibody Screen: NEGATIVE

## 2023-07-06 LAB — PROTEIN / CREATININE RATIO, URINE
Creatinine, Urine: 34 mg/dL
Total Protein, Urine: <6 mg/dL

## 2023-07-06 MED ORDER — SODIUM CHLORIDE 0.9 % IV SOLN
5.0000 10*6.[IU] | Freq: Once | INTRAVENOUS | Status: AC
  Administered 2023-07-06: 5 10*6.[IU] via INTRAVENOUS
  Filled 2023-07-06: qty 5

## 2023-07-06 MED ORDER — FLEET ENEMA RE ENEM: 1.0000 | ENEMA | RECTAL | Status: DC | PRN

## 2023-07-06 MED ORDER — SERTRALINE HCL 100 MG PO TABS
100.0000 mg | ORAL_TABLET | Freq: Every day | ORAL | Status: DC
  Administered 2023-07-06: 100 mg via ORAL
  Filled 2023-07-06 (×2): qty 1

## 2023-07-06 MED ORDER — OXYTOCIN-SODIUM CHLORIDE 30-0.9 UT/500ML-% IV SOLN
2.5000 [IU]/h | INTRAVENOUS | Status: DC
  Administered 2023-07-07: 2.5 [IU]/h via INTRAVENOUS
  Filled 2023-07-06: qty 500

## 2023-07-06 MED ORDER — LIDOCAINE HCL (PF) 1 % IJ SOLN
30.0000 mL | INTRAMUSCULAR | Status: AC | PRN
  Administered 2023-07-07: 30 mL via SUBCUTANEOUS
  Filled 2023-07-06: qty 30

## 2023-07-06 MED ORDER — FENTANYL CITRATE (PF) 100 MCG/2ML IJ SOLN: 100.0000 ug | INTRAMUSCULAR | Status: DC | PRN

## 2023-07-06 MED ORDER — ONDANSETRON HCL 4 MG/2ML IJ SOLN: 4.0000 mg | Freq: Four times a day (QID) | INTRAMUSCULAR | Status: DC | PRN

## 2023-07-06 MED ORDER — OXYTOCIN BOLUS FROM INFUSION
333.0000 mL | Freq: Once | INTRAVENOUS | Status: AC
  Administered 2023-07-07: 333 mL via INTRAVENOUS

## 2023-07-06 MED ORDER — SOD CITRATE-CITRIC ACID 500-334 MG/5ML PO SOLN: 30.0000 mL | ORAL | Status: DC | PRN

## 2023-07-06 MED ORDER — LACTATED RINGERS IV SOLN: 500.0000 mL | INTRAVENOUS | Status: DC | PRN

## 2023-07-06 MED ORDER — OXYCODONE-ACETAMINOPHEN 5-325 MG PO TABS: 1.0000 | ORAL_TABLET | ORAL | Status: DC | PRN

## 2023-07-06 MED ORDER — ACETAMINOPHEN 325 MG PO TABS: 650.0000 mg | ORAL_TABLET | ORAL | Status: DC | PRN

## 2023-07-06 MED ORDER — PENICILLIN G POT IN DEXTROSE 60000 UNIT/ML IV SOLN
3.0000 10*6.[IU] | INTRAVENOUS | Status: DC
  Administered 2023-07-06: 3 10*6.[IU] via INTRAVENOUS
  Filled 2023-07-06 (×2): qty 50

## 2023-07-06 MED ORDER — LACTATED RINGERS IV SOLN
INTRAVENOUS | Status: DC
  Administered 2023-07-06: 125 mL via INTRAVENOUS

## 2023-07-06 MED ORDER — OXYCODONE-ACETAMINOPHEN 5-325 MG PO TABS: 2.0000 | ORAL_TABLET | ORAL | Status: DC | PRN

## 2023-07-06 NOTE — Progress Notes (Signed)
 Patient ID: Mary Cochran, female   DOB: Feb 26, 1995, 28 y.o.   MRN: [id number]  Patient Vitals for the past 4 hrs:  BP Temp Temp src Pulse Resp  07/06/23 1835 95/68 -- -- 74 16  07/06/23 1833 -- 98.3 F (36.8 C) Oral -- --  07/06/23 1759 (!) 99/57 -- -- 83 16  07/06/23 1705 115/62 -- -- 95 --  07/06/23 1702 -- 98.2 F (36.8 C) Oral -- 14   Patient resting in bed. Has been alternating between up walking and lying in bed with peanut ball between legs. Contractions are now painful. Approx 5 minutes apart based on patient's report. Continues to leak amniotic fluid. Cervical exam 4 cm dilated, 60% effaced, -2 station. Plan to start nipple stimulation. FHR Cat 1; may continue intermittent monitoring.   Ida Mains, [facility] 07/06/23 2001

## 2023-07-06 NOTE — MAU Note (Signed)
 Mary Cochran is a 28 y.o. at [redacted]w[redacted]d here in MAU reporting: been having contractions, have been irregular.  Yesterday started contracting more regular.  Started having some tightness in chest like a band around chest and head.  That is not consistent with every contraction.  "Have been wonky".  Still having the tightness this morning.  Makes her dizzy, not painful. Doesn't feel normal.  Membranes swept on Thursday.  Lost more of her plug and has had some green mucous d/c.  No bleeding or water leaking Onset of complaint: yesterday Pain score: 3 Vitals:   07/06/23 1117 07/06/23 1119  BP:  122/63  Pulse:  94  Resp:  17  Temp:  98.1 F (36.7 C)  SpO2: 98% 99%     FHT:140 Lab orders placed from triage:

## 2023-07-06 NOTE — MAU Provider Note (Signed)
 Chief Complaint:  Contractions, Dizziness (/), and tightness in head and chest (Not painful)   HPI   None     Mary Cochran is a 28 y.o. [id number] at [redacted]w[redacted]d who presents to maternity admissions reporting irregular contractions. States she has started having tightness around chest and head "like a band". No chest pain, no SOB, just tightness. She reports she just doesn't feel normal. She reports new edema in face and hands. She reports decreased fetal movement. Denies LOF or VB, but does report some increased "green discharge".  "Just doesn't feel right". Had desired waterbirth but reports accepting whatever is safest for her and baby.  .   Pregnancy Course: DWB, overall benign  Past Medical History:  Diagnosis Date   Adenomatous polyp of colon 10/04/2015   ALLERGIC RHINITIS 12/12/2006   Allergy    SEASONAL   Anxiety    Attention and concentration deficit 06/08/2021   Depression 10/03/2011   GILBERT'S SYNDROME 03/07/2010   Headache    Migraines   IBS (irritable bowel syndrome) 10/04/2015   INSOMNIA-SLEEP DISORDER-UNSPEC 04/18/2007   Nonallopathic lesion of lumbosacral region 10/26/2015   Nonallopathic lesion of sacral region 10/26/2015   Nonallopathic lesion of thoracic region 10/26/2015   SYNCOPE 10/07/2008   Qualifier: Diagnosis of   By: Autry Legions MD, Alveda Aures        Tubular adenoma of colon 10/2014   Vertigo 04/06/2012   OB History  Gravida Para Term Preterm AB Living  3 2 2   2   SAB IAB Ectopic Multiple Live Births     0 2    # Outcome Date GA Lbr Len/2nd Weight Sex Type Anes PTL Lv  3 Current           2 Term 08/05/18 [redacted]w[redacted]d  3033 g M Vag-Spont  N LIV  1 Term 07/07/17 [redacted]w[redacted]d 01:46 / 01:03 2810 g F Vag-Spont EPI  LIV   Past Surgical History:  Procedure Laterality Date   COLONOSCOPY  2016   MOUTH SURGERY     TONSILLECTOMY     Family History  Problem Relation Age of Onset   Varicose Veins Mother    Colon polyps Mother    Heart disease Father    Kidney cancer Maternal  Aunt    Breast cancer Maternal Grandmother    Heart disease Maternal Grandmother    Miscarriages / Stillbirths Maternal Grandmother    Heart disease Maternal Grandfather    Varicose Veins Maternal Grandfather    Pancreatic cancer Other    Colon cancer Neg Hx    Esophageal cancer Neg Hx    Rectal cancer Neg Hx    Stomach cancer Neg Hx    Social History   Tobacco Use   Smoking status: Never   Smokeless tobacco: Never  Vaping Use   Vaping status: Never Used  Substance Use Topics   Alcohol use: Not Currently    Comment: OCC.   Drug use: No   Allergies  Allergen Reactions   Azithromycin  Other (See Comments)    Severe stomach ache   Hydrocodone  Other (See Comments)    GI upset   Morphine     Unknown, childhood reaction   Ciprofloxacin  Hcl Hives   Medications Prior to Admission  Medication Sig Dispense Refill Last Dose/Taking   Doxylamine -Pyridoxine  (DICLEGIS ) 10-10 MG TBEC Take 2 tablets by mouth at bedtime. Take 2 tablets by mouth at bedtime. 100 tablet 3 07/05/2023   Prenatal Vit-Fe Fumarate-FA (PRENATAL MULTIVITAMIN) TABS tablet Take 1 tablet by  mouth daily at 12 noon.   07/05/2023   Probiotic Product (PROBIOTIC PO) Take by mouth.   07/05/2023   sertraline  (ZOLOFT ) 100 MG tablet Take 1 tablet (100 mg total) by mouth daily. 90 tablet 3 07/05/2023    I have reviewed patient's Past Medical Hx, Surgical Hx, Family Hx, Social Hx, medications and allergies.   ROS  Pertinent items noted in HPI and remainder of comprehensive ROS otherwise negative.   PHYSICAL EXAM  Patient Vitals for the past 24 hrs:  BP Temp Temp src Pulse Resp SpO2 Height Weight  07/06/23 1326 (!) 107/92 97.8 F (36.6 C) Oral 87 17 -- -- --  07/06/23 1119 122/63 98.1 F (36.7 C) Oral 94 17 99 % 5\' 7"  (1.702 m) 73.4 kg  07/06/23 1117 -- -- -- -- -- 98 % -- --    Physical Exam Vitals and nursing note reviewed.  Constitutional:      Appearance: Normal appearance.  Cardiovascular:     Rate and Rhythm:  Normal rate.     Pulses: Normal pulses.  Pulmonary:     Effort: Pulmonary effort is normal.  Abdominal:     Comments: Gravid abdomen  Genitourinary:    General: Normal vulva.  Musculoskeletal:     Cervical back: Normal range of motion.  Skin:    General: Skin is warm.     Capillary Refill: Capillary refill takes less than 2 seconds.  Neurological:     General: No focal deficit present.     Mental Status: She is alert.  Psychiatric:        Mood and Affect: Mood normal.        Behavior: Behavior normal.        Thought Content: Thought content normal.        Judgment: Judgment normal.         Fetal Tracing: Baseline: 145 Variability: moderate Accelerations: 15x15 Decelerations: variable Toco: irregular   Labs: Results for orders placed or performed during the hospital encounter of 07/06/23 (from the past 24 hours)  CBC with Differential/Platelet     Status: Abnormal   Collection Time: 07/06/23 12:27 PM  Result Value Ref Range   WBC 8.3 4.0 - 10.5 K/uL   RBC 3.84 (L) 3.87 - 5.11 MIL/uL   Hemoglobin 12.0 12.0 - 15.0 g/dL   HCT 16.1 (L) 09.6 - 04.5 %   MCV 93.2 80.0 - 100.0 fL   MCH 31.3 26.0 - 34.0 pg   MCHC 33.5 30.0 - 36.0 g/dL   RDW 40.9 81.1 - 91.4 %   Platelets 204 150 - 400 K/uL   nRBC 0.0 0.0 - 0.2 %   Neutrophils Relative % 70 %   Neutro Abs 5.9 1.7 - 7.7 K/uL   Lymphocytes Relative 15 %   Lymphs Abs 1.2 0.7 - 4.0 K/uL   Monocytes Relative 11 %   Monocytes Absolute 0.9 0.1 - 1.0 K/uL   Eosinophils Relative 1 %   Eosinophils Absolute 0.1 0.0 - 0.5 K/uL   Basophils Relative 1 %   Basophils Absolute 0.0 0.0 - 0.1 K/uL   Immature Granulocytes 2 %   Abs Immature Granulocytes 0.15 (H) 0.00 - 0.07 K/uL  Comprehensive metabolic panel     Status: Abnormal   Collection Time: 07/06/23 12:27 PM  Result Value Ref Range   Sodium 133 (L) 135 - 145 mmol/L   Potassium 3.9 3.5 - 5.1 mmol/L   Chloride 104 98 - 111 mmol/L   CO2 20 (L) 22 -  32 mmol/L   Glucose, Bld 84  70 - 99 mg/dL   BUN 8 6 - 20 mg/dL   Creatinine, Ser 0.27 0.44 - 1.00 mg/dL   Calcium 9.0 8.9 - 25.3 mg/dL   Total Protein 6.1 (L) 6.5 - 8.1 g/dL   Albumin 2.6 (L) 3.5 - 5.0 g/dL   AST 23 15 - 41 U/L   ALT 13 0 - 44 U/L   Alkaline Phosphatase 115 38 - 126 U/L   Total Bilirubin 0.9 0.0 - 1.2 mg/dL   GFR, Estimated >66 >44 mL/min   Anion gap 9 5 - 15  Type and screen [facility]     Status: None (Preliminary result)   Collection Time: 07/06/23 12:27 PM  Result Value Ref Range   ABO/RH(D) PENDING    Antibody Screen PENDING    Sample Expiration      07/09/2023,2359 Performed at [facility], [address] [place], [place] [address]   Protein / creatinine ratio, urine     Status: None   Collection Time: 07/06/23 12:59 PM  Result Value Ref Range   Creatinine, Urine 34 mg/dL   Total Protein, Urine <6 mg/dL   Protein Creatinine Ratio        0.00 - 0.15 mg/mg[Cre]    Imaging:  DG CHEST PORT 1 VIEW Result Date: 07/06/2023 CLINICAL DATA:  Chest pressure EXAM: PORTABLE CHEST 1 VIEW COMPARISON:  Chest x-ray performed January 19, 2021 FINDINGS: The heart size and mediastinal contours are within normal limits. Both lungs are clear. The visualized skeletal structures are unremarkable. IMPRESSION: No active disease. Electronically Signed   By: Reagan Camera M.D.   On: 07/06/2023 12:41    MDM & MAU COURSE  MDM: Reviewed EMR, Hx with patient Report of DFM at term warrants admission and IOL Rule out Preeclampsia (labs benign) Rule out pulmonary or cardiac concern with imaging: CXR, EKG (both benign).   MAU Course: Orders Placed This Encounter  Procedures   DG CHEST PORT 1 VIEW   CBC with Differential/Platelet   Comprehensive metabolic panel   Protein / creatinine ratio, urine   RPR   Diet laboring   Vitals signs per unit policy   Notify physician (specify)   Fetal monitoring per unit policy   Activity as tolerated   Cervical Exam   Measure blood pressure  post delivery every 15 min x 1 hour then every 30 min x 1 hour   Fundal check post delivery every 15 min x 1 hour then every 30 min x 1 hour   Apply Labor & Delivery Care Plan   If Rapid HIV test positive or known HIV positive: initiate AZT orders   May in and out cath x 2 for inability to void   Insert urethral catheter X 1 PRN If Coude Catheter is chosen, qualified resources by campus can be found in the clinical skills nursing procedure for Coude Catheter 1. If straight catheterized > 2 times or patient unable to void post epidural plac...   Refer to Sidebar Report Urinary (Foley) Catheter Indications   Refer to Sidebar Report Post Indwelling Urinary Catheter Removal and Intervention Guidelines   Discontinue foley prior to vaginal delivery   Initiate Oral Care Protocol   Initiate Carrier Fluid Protocol   Full code   Nitrous Oxide 50%/Oxygen 50%   ED EKG   Type and screen [facility]   Insert peripheral IV   Insert and maintain IV Line  Admit to Inpatient (patient's expected length of stay will be greater than 2 midnights or inpatient only procedure)   Meds ordered this encounter  Medications   sertraline  (ZOLOFT ) tablet 100 mg   lactated ringers  infusion   oxytocin  (PITOCIN ) IV BOLUS FROM BAG   oxytocin  (PITOCIN ) IV infusion 30 units in NS 500 mL - Premix   lactated ringers  infusion 500-1,000 mL   acetaminophen  (TYLENOL ) tablet 650 mg   oxyCODONE -acetaminophen  (PERCOCET/ROXICET) 5-325 MG per tablet 1 tablet   oxyCODONE -acetaminophen  (PERCOCET/ROXICET) 5-325 MG per tablet 2 tablet   sodium phosphate (FLEET) enema 1 enema   ondansetron  (ZOFRAN ) injection 4 mg   sodium citrate-citric acid  (ORACIT) solution 30 mL   lidocaine  (PF) (XYLOCAINE ) 1 % injection 30 mL   FOLLOWED BY Linked Order Group    penicillin G potassium 5 Million Units in sodium chloride  0.9 % 250 mL IVPB     Antibiotic Indication::   Group B Strep Prophylaxis    penicillin G potassium 3 Million  Units in dextrose 50mL IVPB     Antibiotic Indication::   Group B Strep Prophylaxis   fentaNYL  (SUBLIMAZE ) injection 100 mcg    ASSESSMENT  Indication for care in L&D  PLAN  Admit to L&D for IOL given report of DFM at term.  Care turned over to Alvah Auerbach, CNM as labor team.    Allergies as of 07/06/2023       Reactions   Azithromycin  Other (See Comments)   Severe stomach ache   Hydrocodone  Other (See Comments)   GI upset   Morphine    Unknown, childhood reaction   Ciprofloxacin  Hcl Hives       Raford Bunk, MSN, CNM 07/06/2023 2:08 PM  Certified Nurse Midwife, [facility]

## 2023-07-06 NOTE — H&P (Cosign Needed Addendum)
 OBSTETRIC ADMISSION HISTORY AND PHYSICAL  Mary Cochran is a 28 y.o. female G38P2002 with IUP at [redacted]w[redacted]d by US  at 6w presenting for IOL for decreased fetal movement at term. On arrival to MAU she was reporting chest and face tightness and dizziness with contractions. No chest pain or SOB with symptoms. Chest XR and EKG normal. Symptoms have improved, now only feeling mild tension in head/face with contractions. No LOF, no VB, no blurry vision, headaches, or RUQ pain. No having normal fetal movement; states movement was decreased while in MAU but she feels baby was asleep. She plans on breastfeeding. She is planning vasectomy for birth control, declines any contraception in interim.   She received her prenatal care at [facility].  Dating: By 6w US  --->  Estimated Date of Delivery: 07/11/23  Sono:  @[redacted]w[redacted]d , CWD, normal anatomy, cephalic presentation, posterior placenta, 1667g, 50% EFW  NURSING  PROVIDER  Office Location [facility] Dating by U/s at 6+0 (done at [facility]) EDD 07/11/23  [facility] Model Traditional Anatomy U/S 02/14/2023 NL  Initiated care at  [facility]  English              LAB RESULTS   Support Person Mary Cochran [facility] NIPS: LR/ Female AFP: negative    NT/IT (FT only)     Carrier Screen Horizon: neg 4/4  Rhogam  O/Negative/-- (11/07 0854) Declined Rhogam/Panorama confirmed Rhogam not indicated.  Baby is RH negative. A1C/GTT Early:  Third trimester: Passed  Flu Vaccine declined    TDaP Vaccine  Declined 04/10/23 Blood Type O/Negative/-- (11/07 0854)  Covid Vaccine X2 doses Antibody Negative (11/07 0854)  RSV Vaccine Not indicated Rubella 2.09 (11/07 0854)  Feeding Plan breast RPR Non Reactive (11/07 0854)  Contraception Condoms HBsAg Negative (11/07 0854)  Circumcision No HIV Non Reactive (11/07 0854)  Pediatrician  [facility], [facility] HCVAb  neg  Prenatal Classes Info given      Pap 09/09/2020  BTL Consent  GC/CT Initial:  neg/eg 36wks:   neg/neg  VBAC Consent  GBS  POSITIVE        DME Rx [X]  BP cuff [ ]  Weight Scale Spero Dye  Galerius.Gant ] Class [ ]  Consent [x ] CNM visit  PHQ9 & GAD7 [x]  new OB [  ] 28 weeks  [x ] 36 weeks Induction  [ ]  Orders Entered [ ] Foley Y/N     Prenatal History/Complications: NSVD full term x 2  Past Medical History: Past Medical History:  Diagnosis Date   Adenomatous polyp of colon 10/04/2015   ALLERGIC RHINITIS 12/12/2006   Allergy    SEASONAL   Anxiety    Attention and concentration deficit 06/08/2021   Depression 10/03/2011   GILBERT'S SYNDROME 03/07/2010   Headache    Migraines   IBS (irritable bowel syndrome) 10/04/2015   INSOMNIA-SLEEP DISORDER-UNSPEC 04/18/2007   Nonallopathic lesion of lumbosacral region 10/26/2015   Nonallopathic lesion of sacral region 10/26/2015   Nonallopathic lesion of thoracic region 10/26/2015   SYNCOPE 10/07/2008   Qualifier: Diagnosis of   By: Mary Legions MD, Alveda Aures        Tubular adenoma of colon 10/2014   Vertigo 04/06/2012    Past Surgical History: Past Surgical History:  Procedure Laterality Date   COLONOSCOPY  2016   MOUTH SURGERY     TONSILLECTOMY      Obstetrical History: OB History     Gravida  3  Para  2   Term  2   Preterm      AB      Living  2      SAB      IAB      Ectopic      Multiple  0   Live Births  2           Social History Social History   Socioeconomic History   Marital status: Married    Spouse name: Not on file   Number of children: Not on file   Years of education: Not on file   Highest education level: Not on file  Occupational History   Occupation: Engineer, drilling  Tobacco Use   Smoking status: Never   Smokeless tobacco: Never  Vaping Use   Vaping status: Never Used  Substance and Sexual Activity   Alcohol use: Not Currently    Comment: OCC.   Drug use: No   Sexual activity: Yes    Birth control/protection: None  Other Topics Concern   Not on file  Social History  Narrative   Not on file   Social Drivers of Health   Financial Resource Strain: Low Risk  (11/26/2022)   Overall Financial Resource Strain (CARDIA)    Difficulty of Paying Living Expenses: Not very hard  Food Insecurity: No Food Insecurity (07/06/2023)   Hunger Vital Sign    Worried About Running Out of Food in the Last Year: Never true    Ran Out of Food in the Last Year: Never true  Transportation Needs: No Transportation Needs (07/06/2023)   PRAPARE - Administrator, Civil Service (Medical): No    Lack of Transportation (Non-Medical): No  Physical Activity: Insufficiently Active (11/26/2022)   Exercise Vital Sign    Days of Exercise per Week: 2 days    Minutes of Exercise per Session: 50 min  Stress: Stress Concern Present (11/26/2022)   [employer] of Occupational Health - Occupational Stress Questionnaire    Feeling of Stress : Rather much  Social Connections: Unknown (07/06/2023)   Social Connection and Isolation Panel [NHANES]    Frequency of Communication with Friends and Family: More than three times a week    Frequency of Social Gatherings with Friends and Family: More than three times a week    Attends Religious Services: Patient declined    Database administrator or Organizations: Patient declined    Attends Engineer, structural: Patient declined    Marital Status: Married    Family History: Family History  Problem Relation Age of Onset   Varicose Veins Mother    Colon polyps Mother    Heart disease Father    Kidney cancer Maternal Aunt    Breast cancer Maternal Grandmother    Heart disease Maternal Grandmother    Miscarriages / Stillbirths Maternal Grandmother    Heart disease Maternal Grandfather    Varicose Veins Maternal Grandfather    Pancreatic cancer Other    Colon cancer Neg Hx    Esophageal cancer Neg Hx    Rectal cancer Neg Hx    Stomach cancer Neg Hx     Allergies: Allergies  Allergen Reactions   Azithromycin  Other  (See Comments)    Severe stomach ache   Hydrocodone  Other (See Comments)    GI upset   Morphine     Unknown, childhood reaction   Ciprofloxacin  Hcl Hives    Medications Prior to Admission  Medication Sig Dispense Refill Last  Dose/Taking   Doxylamine -Pyridoxine  (DICLEGIS ) 10-10 MG TBEC Take 2 tablets by mouth at bedtime. Take 2 tablets by mouth at bedtime. 100 tablet 3 07/05/2023   Prenatal Vit-Fe Fumarate-FA (PRENATAL MULTIVITAMIN) TABS tablet Take 1 tablet by mouth daily at 12 noon.   07/05/2023   Probiotic Product (PROBIOTIC PO) Take by mouth.   07/05/2023   sertraline  (ZOLOFT ) 100 MG tablet Take 1 tablet (100 mg total) by mouth daily. 90 tablet 3 07/05/2023    Review of Systems   All systems reviewed and negative except as stated in HPI  Blood pressure 122/63, pulse 94, temperature 98.1 F (36.7 C), temperature source Oral, resp. rate 17, height 5\' 7"  (1.702 m), weight 73.4 kg, last menstrual period 09/27/2022, SpO2 99%. General appearance: alert, cooperative, and no distress Lungs: clear to auscultation bilaterally Heart: regular rate and rhythm Abdomen: soft, non-tender; Pelvic: SVE cervix 3 cm dilated at external os, internal os 2 cm dilated Extremities: No sign of DVT Presentation: cephalic Fetal monitoring: Baseline: 140 bpm, Variability: moderate, Accelerations: present, and Decelerations: Absent Uterine activity: irregular, every 4-6 minutes   Prenatal labs: ABO, Rh: O/Negative/-- (11/07 0854) Antibody: Negative (03/05 0828) Rubella: 2.09 (11/07 0854) RPR: Non Reactive (03/05 0828)  HBsAg: Negative (11/07 0854)  HIV: Non Reactive (03/05 0828)  GBS: Positive/-- (05/08 1117)    Lab Results  Component Value Date   GBS Positive (A) 06/13/2023   GTT: fasting 76, 1hr 129, 2hr 77 (04/10/23) Genetic screening: NIPS low risk, female (12/13/22) Anatomy US  normal at [facility] History  Administered Date(s) Administered   PFIZER(Purple Top)SARS-COV-2 Vaccination  07/03/2019, 07/24/2019   Tdap 08/16/2011    Prenatal Transfer Tool  Maternal Diabetes: No Genetic Screening: Normal Maternal Ultrasounds/Referrals: Normal Fetal Ultrasounds or other Referrals:  Referred to Materal Fetal Medicine  Maternal Substance Abuse:  No Significant Maternal Medications:  Meds include: Zoloft  Significant Maternal Lab Results: Group B Strep positive and Rh negative, declined rhogam (NIPS  shows fetus Rh neg) Number of Prenatal Visits:greater than 3 verified prenatal visits Maternal Vaccinations:  None Other Comments:  None  No results found for this or any previous visit (from the past 24 hours).  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 G3P2002 at [redacted]w[redacted]d here for IOL due to decreased fetal movement at 39w with irregular contractions.  #Labor: irregular UC q 4-6 min, felt by patient but not painful. Reviewed pitocin  v AROM. Desires a low intervention labor and birth. Would like to trial AROM and ambulation. If this does not illicit regular contractions, she is open to discussing pitocin . Cervix 3 cm dilated at external os, internal os 2 cm. AROM performed by Mary Cochran, CNM. Cervix now 3 cm dilated. Clear fluid and bloody show seen following AROM. Well tolerated by patient. #Pain: planning non medication comfort measures #FWB: Category 1, will begin intermittent fetal monitoring #GBS status: Positive; penicillin ordered and initial dose complete. She is uncertain about receiving subsequent doses, reviewed risks of untreated GBS, plan for now to receive next dose in 4 hours. #Feeding: Breastmilk  #Reproductive Life planning: Vasectomy #Circ: No  Mary Cochran, Student-MidWife  07/06/2023, 1:08  PM   Attestation of Supervision of Student:  I confirm that I have  verified the information documented in the nurse midwife student's note and that I have also personally performed the history, physical exam and all medical decision making activities.  I have verified that all services and findings are accurately documented in this student's note; and I agree with management and plan as outlined in the documentation. I have also made any necessary editorial changes.   Mary Cochran, CNM [facility] [employer], [facility] 07/06/2023 3:56 PM

## 2023-07-06 NOTE — Progress Notes (Signed)
  Subjective: EFM x 1 hour. Nipple stimulation done during the hour of EFM. Contractions are getting more frequent and painful. Patient ready to get back in the tub.  Objective: BP 138/75   Pulse 87   Temp 98.3 F (36.8 C) (Oral)   Resp 16   Ht 5\' 7"  (1.702 m)   Wt 73.4 kg   LMP 09/27/2022 Comment: GA is 4 weeks and 6 days  SpO2 99%   BMI 25.34 kg/m  No intake/output data recorded. No intake/output data recorded.  FHT:  FHR: 135 bpm, variability: moderate,  accelerations:  Present,  decelerations:  Present brief variables UC:   regular, every 2-3 minutes SVE:   Dilation: 6 Effacement (%): 80 Station: -2 Exam by:: [employer]  Labs: Lab Results  Component Value Date   WBC 8.3 07/06/2023   HGB 12.0 07/06/2023   HCT 35.8 (L) 07/06/2023   MCV 93.2 07/06/2023   PLT 204 07/06/2023    Assessment / Plan: Augmentation of labor, progressing well  Labor: Progressing normally Preeclampsia:  n/a Fetal Wellbeing:  Category I Pain Control:  Labor support without medications - will reenter the water tub I/D:  GBS POS Anticipated MOD:  NSVD  Almond Army, CNM 07/06/2023, 11:39 PM

## 2023-07-06 NOTE — Progress Notes (Signed)
 Subjective: TC from RN to review EFM tracing. Two minute decel noted with contraction.  Objective: BP 138/75   Pulse 87   Temp 98.3 F (36.8 C) (Oral)   Resp 16   Ht 5\' 7"  (1.702 m)   Wt 73.4 kg   LMP 09/27/2022 Comment: GA is 4 weeks and 6 days  SpO2 99%   BMI 25.34 kg/m  No intake/output data recorded. No intake/output data recorded.  FHT:  FHR: 130 bpm, variability: moderate,  accelerations:  Present,  decelerations:  Present variable x 2 mins UC:   regular, every 2-3 minutes SVE:   Dilation: 4 Effacement (%): 60 Station: -2 Exam by:: Laverna Pott  Labs: Lab Results  Component Value Date   WBC 8.3 07/06/2023   HGB 12.0 07/06/2023   HCT 35.8 (L) 07/06/2023   MCV 93.2 07/06/2023   PLT 204 07/06/2023    Assessment / Plan: Augmentation of labor, progressing well  Labor: Progressing normally Preeclampsia:  n/a Fetal Wellbeing:  Category I Pain Control:  Labor support without medications I/D:  GBS POS Anticipated MOD:  NSVD  Advised RN to keep patient on CEFM for ~ 1 hour. Will re-evaluate in 1 hour to determine if intermittent EFM can be resumed.  Almond Army, CNM 07/06/2023, 10:13 PM

## 2023-07-07 ENCOUNTER — Encounter (HOSPITAL_COMMUNITY): Payer: Self-pay | Admitting: Obstetrics & Gynecology

## 2023-07-07 DIAGNOSIS — O99820 Streptococcus B carrier state complicating pregnancy: Secondary | ICD-10-CM

## 2023-07-07 DIAGNOSIS — Z3A39 39 weeks gestation of pregnancy: Secondary | ICD-10-CM

## 2023-07-07 DIAGNOSIS — O368130 Decreased fetal movements, third trimester, not applicable or unspecified: Secondary | ICD-10-CM

## 2023-07-07 LAB — RPR: RPR Ser Ql: NONREACTIVE

## 2023-07-07 MED ORDER — BENZOCAINE-MENTHOL 20-0.5 % EX AERO
1.0000 | INHALATION_SPRAY | CUTANEOUS | Status: DC | PRN
  Administered 2023-07-07: 1 via TOPICAL
  Filled 2023-07-07 (×2): qty 56

## 2023-07-07 MED ORDER — WITCH HAZEL-GLYCERIN EX PADS: 1.0000 | MEDICATED_PAD | CUTANEOUS | Status: DC | PRN

## 2023-07-07 MED ORDER — ZOLPIDEM TARTRATE 5 MG PO TABS: 5.0000 mg | ORAL_TABLET | Freq: Every evening | ORAL | Status: DC | PRN

## 2023-07-07 MED ORDER — PRENATAL MULTIVITAMIN CH
1.0000 | ORAL_TABLET | Freq: Every day | ORAL | Status: DC
  Administered 2023-07-07: 1 via ORAL
  Filled 2023-07-07 (×2): qty 1

## 2023-07-07 MED ORDER — ACETAMINOPHEN 325 MG PO TABS
650.0000 mg | ORAL_TABLET | ORAL | Status: DC | PRN
  Administered 2023-07-07 (×2): 650 mg via ORAL
  Filled 2023-07-07 (×2): qty 2

## 2023-07-07 MED ORDER — TETANUS-DIPHTH-ACELL PERTUSSIS 5-2.5-18.5 LF-MCG/0.5 IM SUSY: 0.5000 mL | PREFILLED_SYRINGE | Freq: Once | INTRAMUSCULAR | Status: DC

## 2023-07-07 MED ORDER — SENNOSIDES-DOCUSATE SODIUM 8.6-50 MG PO TABS
2.0000 | ORAL_TABLET | Freq: Every day | ORAL | Status: DC
  Administered 2023-07-08: 2 via ORAL
  Filled 2023-07-07: qty 2

## 2023-07-07 MED ORDER — DIBUCAINE (PERIANAL) 1 % EX OINT: 1.0000 | TOPICAL_OINTMENT | CUTANEOUS | Status: DC | PRN

## 2023-07-07 MED ORDER — SIMETHICONE 80 MG PO CHEW: 80.0000 mg | CHEWABLE_TABLET | ORAL | Status: DC | PRN

## 2023-07-07 MED ORDER — COCONUT OIL OIL: 1.0000 | TOPICAL_OIL | Status: DC | PRN

## 2023-07-07 MED ORDER — SERTRALINE HCL 100 MG PO TABS
100.0000 mg | ORAL_TABLET | Freq: Every day | ORAL | Status: DC
  Administered 2023-07-07: 100 mg via ORAL
  Filled 2023-07-07: qty 1

## 2023-07-07 MED ORDER — IBUPROFEN 600 MG PO TABS
600.0000 mg | ORAL_TABLET | Freq: Four times a day (QID) | ORAL | Status: DC
  Administered 2023-07-07 – 2023-07-08 (×6): 600 mg via ORAL
  Filled 2023-07-07 (×6): qty 1

## 2023-07-07 MED ORDER — ONDANSETRON HCL 4 MG PO TABS: 4.0000 mg | ORAL_TABLET | ORAL | Status: DC | PRN

## 2023-07-07 MED ORDER — ONDANSETRON HCL 4 MG/2ML IJ SOLN: 4.0000 mg | INTRAMUSCULAR | Status: DC | PRN

## 2023-07-07 MED ORDER — DIPHENHYDRAMINE HCL 25 MG PO CAPS: 25.0000 mg | ORAL_CAPSULE | Freq: Four times a day (QID) | ORAL | Status: DC | PRN

## 2023-07-07 NOTE — Discharge Summary (Signed)
 Postpartum Discharge Summary  Date of Service updated***     Patient Name: Mary Cochran DOB: 06-30-1995 MRN: [id number]  Date of admission: 07/06/2023 Delivery date:07/07/2023 Delivering provider: Almond Army Date of discharge: 07/07/2023  Admitting diagnosis: Labor and delivery, indication for care [O75.9] Intrauterine pregnancy: [redacted]w[redacted]d     Secondary diagnosis:  Principal Problem:   Indication for care in labor or delivery Active Problems:   Postpartum care following vaginal delivery  Additional problems: ***    Discharge diagnosis: {DX.:23714}                                              Post partum procedures:{Postpartum procedures:23558} Augmentation: {Augmentation:20782} Complications: None  Hospital course: Induction of Labor With Vaginal Delivery   28 y.o. yo [id number] at [redacted]w[redacted]d was admitted to the hospital 07/06/2023 for induction of labor.  Indication for induction: DFM at Term.  Patient had an labor course complicated by nothing. Membrane Rupture Time/Date: 3:28 PM,07/06/2023  Delivery Method:Vaginal, Spontaneous Operative Delivery:N/A Episiotomy: None Lacerations:  2nd degree;Perineal Details of delivery can be found in separate delivery note.  Patient had a postpartum course complicated by***. Patient is discharged home 07/07/23.  Newborn Data: Birth date:07/07/2023 Birth time:3:40 AM Gender:Female Living status:Living Apgars:8 ,9  Weight:   Magnesium Sulfate received: No BMZ received: No Rhophylac :No - Declined Rhogam>>Panorama confirmed Rhogam not indicated.  Baby is RH negative. MMR:No T-DaP:declined Flu: No - declined RSV Vaccine received: No Transfusion:{Transfusion received:30440034}  Immunizations received: Immunization History  Administered Date(s) Administered   PFIZER(Purple Top)SARS-COV-2 Vaccination 07/03/2019, 07/24/2019   Tdap 08/16/2011    Physical exam  Vitals:   07/07/23 0415 07/07/23 0430 07/07/23 0445 07/07/23 0500  BP: 114/73  121/66 121/69 107/62  Pulse: 85 83 86 89  Resp:      Temp:      TempSrc:      SpO2:      Weight:      Height:       General: {Exam; general:21111117} Lochia: {Desc; appropriate/inappropriate:30686::"appropriate"} Uterine Fundus: {Desc; firm/soft:30687} Incision: {Exam; incision:21111123} DVT Evaluation: {Exam; dvt:2111122} Labs: Lab Results  Component Value Date   WBC 8.3 07/06/2023   HGB 12.0 07/06/2023   HCT 35.8 (L) 07/06/2023   MCV 93.2 07/06/2023   PLT 204 07/06/2023      Latest Ref Rng & Units 07/06/2023   12:27 PM  CMP  Glucose 70 - 99 mg/dL 84   BUN 6 - 20 mg/dL 8   Creatinine 1.30 - 8.65 mg/dL 7.84   Sodium 696 - 295 mmol/L 133   Potassium 3.5 - 5.1 mmol/L 3.9   Chloride 98 - 111 mmol/L 104   CO2 22 - 32 mmol/L 20   Calcium 8.9 - 10.3 mg/dL 9.0   Total Protein 6.5 - 8.1 g/dL 6.1   Total Bilirubin 0.0 - 1.2 mg/dL 0.9   Alkaline Phos 38 - 126 U/L 115   AST 15 - 41 U/L 23   ALT 0 - 44 U/L 13    Edinburgh Score:    12/25/2017    2:04 PM  Edinburgh Postnatal Depression Scale Screening Tool  I have been able to laugh and see the funny side of things. 2  I have looked forward with enjoyment to things. 0  I have blamed myself unnecessarily when things went wrong. 1  I have been anxious or worried for no good reason.  2  I have felt scared or panicky for no good reason. 2  Things have been getting on top of me. 2  I have been so unhappy that I have had difficulty sleeping. 2  I have felt sad or miserable. 2  I have been so unhappy that I have been crying. 1  The thought of harming myself has occurred to me. 0  Edinburgh Postnatal Depression Scale Total 14   No data recorded  After visit meds:  Allergies as of 07/07/2023       Reactions   Azithromycin  Other (See Comments)   Severe stomach ache   Hydrocodone  Other (See Comments)   GI upset   Morphine    Unknown, childhood reaction   Ciprofloxacin  Hcl Hives     Med Rec must be completed prior to using  this SMARTLINK***        Discharge home in stable condition Infant Feeding: {Baby feeding:23562} Infant Disposition:{CHL IP OB HOME WITH IONGEX:52841} Discharge instruction: per After Visit Summary and Postpartum booklet. Activity: Advance as tolerated. Pelvic rest for 6 weeks.  Diet: {OB LKGM:01027253} Future Appointments: Future Appointments  Date Time Provider Department Center  07/12/2023  9:15 AM Lillian Rein, MD [facility]  07/18/2023  6:45 AM [facility] SCHED ROOM [facility] None   Follow up Visit:  Follow-up Information     [facility] for [facility] at [facility]. Schedule an appointment as soon as possible for a visit in 6 week(s).   Specialty: Obstetrics and Gynecology Why: postpartum visit Contact information: [address]  [place]  [address] [phone]                Message sent to [facility] by Almond Army, CNM on 07/07/2023: Please schedule this patient for a Virtual postpartum visit in 4 weeks with the following provider: Any provider. Additional Postpartum F/U:none  Low risk pregnancy complicated by: none Delivery mode:  Vaginal, Spontaneous Anticipated Birth Control:  planning female vasectomy   07/07/2023 Almond Army, CNM

## 2023-07-07 NOTE — Progress Notes (Signed)
 MOB was referred for history of depression/anxiety. * Referral screened out by Clinical Social Worker because none of the following criteria appear to apply: ~ History of anxiety/depression during this pregnancy, or of post-partum depression following prior delivery. ~ Diagnosis of anxiety and/or depression within last 3 years OR * MOB's symptoms currently being treated with medication and/or therapy. MOB is prescribed and taking Zoloft  100mg  daily.   Please contact the Clinical Social Worker if needs arise, by [facility] request, or if MOB scores greater than 9/yes to question 10 on Edinburgh Postpartum Depression Screen.  Signed,  Elizabeth Gulling, MSW, LCSWA, LCASA 07/07/2023 5:13 PM

## 2023-07-07 NOTE — Lactation Note (Signed)
 This note was copied from a baby's chart. Lactation Consultation Note  Patient Name: Mary Cochran [id number] Date: 07/07/2023 Age:30 hours Reason for consult: Follow-up assessment;Mother's request;Term;Nipple pain/trauma;Breastfeeding assistance  P3- MOB called out for latching assistance. Per MOB, infant has been latching too shallow and had caused a blood blister to form on her left nipple. LC noted the blood blister on the left nipple. The right nipple looked great (round and pink). Infant was currently latched to the right breast and LC noted that it was quite shallow. MOB complained of pain as he was nursing. LC assisted MOB with unlatching him. LC then demonstrated how to hold infant in the cross cradle hold for better support of the head/neck. LC also demonstrated how to stroke MOB's nipple from infant's nose to chin to elicit a gaping mouth. Infant was able to latch deeply, but his bottom lip got tucked in. LC demonstrated how to flange the bottom lip. MOB reported immediate relief. LC provided MOB with hydrogel pads and placed them in the fridge to help with her blister. MOB thanked [facility] and denied having further questions or concerns at this time.   LC reviewed the first 24 hr birthday nap, day 2 cluster feeding, feeding infant on cue 8-12x in 24 hrs, not allowing infant to go over 3 hrs without a feeding, CDC milk storage guidelines, LC services handout and engorgement/breast care. LC encouraged MOB to call for further assistance as needed.  Maternal Data Has patient been taught Hand Expression?: Yes Does the patient have breastfeeding experience prior to this delivery?: Yes How long did the patient breastfeed?: for a few months with both older children, but then she "got mastitis and it ruined her supply"  Feeding Mother's Current Feeding Choice: Breast Milk  LATCH Score Latch: Grasps breast easily, tongue down, lips flanged, rhythmical sucking.  Audible Swallowing: None  Type  of Nipple: Everted at rest and after stimulation  Comfort (Breast/Nipple): Filling, red/small blisters or bruises, mild/mod discomfort  Hold (Positioning): Assistance needed to correctly position infant at breast and maintain latch.  LATCH Score: 6   Lactation Tools Discussed/Used Tools: Comfort gels Pump Education: Milk Storage  Interventions Interventions: Breast feeding basics reviewed;Assisted with latch;Breast compression;Adjust position;Support pillows;Position options;Comfort gels;Education;LC Services brochure  Discharge Discharge Education: Engorgement and breast care;Warning signs for feeding baby Pump: DEBP;Manual;Hands Free;Personal  Consult Status Consult Status: Follow-up Date: 07/08/23 Follow-up type: In-patient    Vernette Goo BS, IBCLC 07/07/2023, 4:15 PM

## 2023-07-07 NOTE — Lactation Note (Signed)
 This note was copied from a baby's chart. Lactation Consultation Note  Patient Name: Mary Cochran [id number] Date: 07/07/2023 Age:30 hours  Reason for consult: Initial assessment, term  P3, [redacted]w[redacted]d  Mother attempting to sleep. Awakened when I entered the room. Brief self introduction and lactation services, milk storage and pump cleaning handouts left at bedside. Mother aware to call if needs breastfeeding assistance. LC will round again later. Baby in room, alert family member at bedside.  Patient resting sign placed on the door.    Feeding Mother's Current Feeding Choice: Breast Milk   Consult Status Consult Status: Follow-up Date: 07/08/23 Follow-up type: In-patient    Gearline Kell M 07/07/2023, 10:04 AM

## 2023-07-08 DIAGNOSIS — O368130 Decreased fetal movements, third trimester, not applicable or unspecified: Secondary | ICD-10-CM | POA: Diagnosis not present

## 2023-07-08 DIAGNOSIS — Z3A39 39 weeks gestation of pregnancy: Secondary | ICD-10-CM | POA: Diagnosis not present

## 2023-07-08 DIAGNOSIS — O99820 Streptococcus B carrier state complicating pregnancy: Secondary | ICD-10-CM | POA: Diagnosis not present

## 2023-07-08 NOTE — Lactation Note (Signed)
 This note was copied from a baby's chart. Lactation Consultation Note  Patient Name: Mary Cochran [id number] Date: 07/08/2023 Age:30 hours Reason for consult: Follow-up assessment  P3, Mother burping baby after recent feeding. Mother states they have follow up OP Lactation with Santos Cullens RN, IBCLC. Mother states blister on left nipple has improved.   She knows to unlatch if baby has shallow latch. Reviewed engorgement care and monitoring voids/stools.  Maternal Data Has patient been taught Hand Expression?: Yes Does the patient have breastfeeding experience prior to this delivery?: Yes  Feeding Mother's Current Feeding Choice: Breast Milk  Interventions Interventions: Breast feeding basics reviewed;Education  Discharge Discharge Education: Engorgement and breast care;Warning signs for feeding baby Pump: DEBP;Hands Free;Personal;Manual  Consult Status Consult Status: Complete Date: 07/08/23  Luellen Sages  RN, IBCLC 07/08/2023, 11:51 AM

## 2023-07-11 ENCOUNTER — Encounter (HOSPITAL_BASED_OUTPATIENT_CLINIC_OR_DEPARTMENT_OTHER): Payer: Self-pay | Admitting: Certified Nurse Midwife

## 2023-07-12 ENCOUNTER — Encounter (HOSPITAL_BASED_OUTPATIENT_CLINIC_OR_DEPARTMENT_OTHER): Payer: Self-pay | Admitting: Obstetrics & Gynecology

## 2023-07-16 ENCOUNTER — Encounter (HOSPITAL_BASED_OUTPATIENT_CLINIC_OR_DEPARTMENT_OTHER): Payer: Self-pay | Admitting: Obstetrics & Gynecology

## 2023-07-16 DIAGNOSIS — R002 Palpitations: Secondary | ICD-10-CM

## 2023-07-17 ENCOUNTER — Ambulatory Visit: Payer: Self-pay | Admitting: Cardiology

## 2023-07-17 ENCOUNTER — Telehealth (HOSPITAL_COMMUNITY): Payer: Self-pay | Admitting: *Deleted

## 2023-07-17 NOTE — Telephone Encounter (Signed)
 07/17/2023  Name: KYLENA MOLE MRN: [id number] DOB: 10/26/1995  Reason for Call:  Transition of Care Hospital Discharge Call  Contact Status: Patient Contact Status: Message  Language assistant needed:          Follow-Up Questions:    Dimple Francis Postnatal Depression Scale:  In the Past 7 Days:    PHQ2-9 Depression Scale:     Discharge Follow-up:    Post-discharge interventions: NA  Arabela Basaldua,RN  07/17/2023 1414

## 2023-07-18 ENCOUNTER — Inpatient Hospital Stay (HOSPITAL_COMMUNITY): Admission: RE | Admit: 2023-07-18 | Source: Home / Self Care | Admitting: Obstetrics and Gynecology

## 2023-07-18 ENCOUNTER — Inpatient Hospital Stay (HOSPITAL_COMMUNITY)

## 2023-07-25 ENCOUNTER — Encounter (HOSPITAL_BASED_OUTPATIENT_CLINIC_OR_DEPARTMENT_OTHER): Payer: Self-pay | Admitting: Obstetrics & Gynecology

## 2023-08-02 ENCOUNTER — Encounter (HOSPITAL_BASED_OUTPATIENT_CLINIC_OR_DEPARTMENT_OTHER): Payer: Self-pay | Admitting: Certified Nurse Midwife

## 2023-08-02 ENCOUNTER — Other Ambulatory Visit: Payer: Self-pay | Admitting: Medical

## 2023-08-02 DIAGNOSIS — N61 Mastitis without abscess: Secondary | ICD-10-CM

## 2023-08-02 MED ORDER — DICLOXACILLIN SODIUM 250 MG PO CAPS: 250.0000 mg | ORAL_CAPSULE | Freq: Four times a day (QID) | ORAL | 0 refills | Status: DC

## 2023-08-02 MED ORDER — CEPHALEXIN 500 MG PO CAPS: 500.0000 mg | ORAL_CAPSULE | Freq: Four times a day (QID) | ORAL | 0 refills | Status: DC

## 2023-08-15 ENCOUNTER — Ambulatory Visit (INDEPENDENT_AMBULATORY_CARE_PROVIDER_SITE_OTHER): Payer: Self-pay | Admitting: Certified Nurse Midwife

## 2023-08-15 ENCOUNTER — Encounter (HOSPITAL_BASED_OUTPATIENT_CLINIC_OR_DEPARTMENT_OTHER): Payer: Self-pay | Admitting: Certified Nurse Midwife

## 2023-08-15 NOTE — Progress Notes (Signed)
 Subjective:     Mary Cochran is a 28 y.o. female who presents for a postpartum visit. She is 6 weeks postpartum following a spontaneous vaginal delivery. I have fully reviewed the prenatal and intrapartum course. The delivery was at 39 gestational weeks. Outcome: spontaneous vaginal delivery. Anesthesia: none. Postpartum course has been uneventful. Baby's course has been uneventful. Baby is feeding by breast. Bleeding no bleeding. Bowel function is normal. Bladder function is normal. Patient will be sexually active. Contraception method is vasectomy. Postpartum depression screening: negative.  The following portions of the patient's history were reviewed and updated as appropriate: allergies, current medications, past family history, past medical history, past social history, past surgical history, and problem list.  Review of Systems Pertinent items are noted in HPI.   Objective:    BP (!) 122/96   Pulse 62   Ht 5' 7 (1.702 m)   Wt 135 lb 3.2 oz (61.3 kg)   LMP 09/27/2022 Comment: GA is 4 weeks and 6 days  Breastfeeding Yes   BMI 21.18 kg/m   General:  alert, cooperative, and appears stated age   Breasts:  lactating  Lungs:   Heart:    Abdomen: soft, non-tender; bowel sounds normal; no masses,  no organomegaly   Vulva:  normal  Vagina: normal vagina, no discharge, exudate, lesion, or erythema  Cervix:    Corpus:   Adnexa:    Rectal Exam: v        Assessment:    G3P3 here for routine postpartum exam. Pap smear not due at today's visit.  Breastfeeding Contraception: Vasectomy Postpartum Depression Screen Negative  Plan:    1. Contraception: Vasectomy 2. Breastfeeding w/o difficulty 3. Follow up in: 1 year  or as needed.   Arland MARLA Roller

## 2023-08-19 ENCOUNTER — Other Ambulatory Visit (HOSPITAL_BASED_OUTPATIENT_CLINIC_OR_DEPARTMENT_OTHER): Payer: Self-pay | Admitting: Certified Nurse Midwife

## 2023-08-19 ENCOUNTER — Encounter (HOSPITAL_BASED_OUTPATIENT_CLINIC_OR_DEPARTMENT_OTHER): Payer: Self-pay | Admitting: Obstetrics & Gynecology

## 2023-08-19 MED ORDER — SULFAMETHOXAZOLE-TRIMETHOPRIM 800-160 MG PO TABS
1.0000 | ORAL_TABLET | Freq: Two times a day (BID) | ORAL | 0 refills | Status: AC
Start: 2023-08-19 — End: ?

## 2023-09-05 DIAGNOSIS — B9689 Other specified bacterial agents as the cause of diseases classified elsewhere: Secondary | ICD-10-CM | POA: Diagnosis not present

## 2023-09-05 DIAGNOSIS — J028 Acute pharyngitis due to other specified organisms: Secondary | ICD-10-CM | POA: Diagnosis not present

## 2023-09-05 DIAGNOSIS — J029 Acute pharyngitis, unspecified: Secondary | ICD-10-CM | POA: Diagnosis not present

## 2023-12-25 ENCOUNTER — Other Ambulatory Visit: Payer: Self-pay | Admitting: Physician Assistant
